# Patient Record
Sex: Female | Born: 1945 | Race: Black or African American | Hispanic: No | Marital: Married | Smoking: Former smoker
Health system: Southern US, Community
[De-identification: ages and names within clinical notes are randomized; demographics above are authoritative.]

## PROBLEM LIST (undated history)

## (undated) DIAGNOSIS — M316 Other giant cell arteritis: Secondary | ICD-10-CM

## (undated) DIAGNOSIS — E119 Type 2 diabetes mellitus without complications: Secondary | ICD-10-CM

## (undated) DIAGNOSIS — E049 Nontoxic goiter, unspecified: Secondary | ICD-10-CM

## (undated) DIAGNOSIS — J329 Chronic sinusitis, unspecified: Secondary | ICD-10-CM

## (undated) DIAGNOSIS — Z720 Tobacco use: Secondary | ICD-10-CM

## (undated) DIAGNOSIS — E039 Hypothyroidism, unspecified: Secondary | ICD-10-CM

## (undated) DIAGNOSIS — K209 Esophagitis, unspecified without bleeding: Secondary | ICD-10-CM

## (undated) DIAGNOSIS — T783XXA Angioneurotic edema, initial encounter: Secondary | ICD-10-CM

## (undated) DIAGNOSIS — Z87898 Personal history of other specified conditions: Secondary | ICD-10-CM

## (undated) DIAGNOSIS — R55 Syncope and collapse: Secondary | ICD-10-CM

## (undated) DIAGNOSIS — F1290 Cannabis use, unspecified, uncomplicated: Secondary | ICD-10-CM

## (undated) DIAGNOSIS — E7800 Pure hypercholesterolemia, unspecified: Secondary | ICD-10-CM

## (undated) DIAGNOSIS — I10 Essential (primary) hypertension: Secondary | ICD-10-CM

## (undated) DIAGNOSIS — T464X5A Adverse effect of angiotensin-converting-enzyme inhibitors, initial encounter: Secondary | ICD-10-CM

## (undated) DIAGNOSIS — R944 Abnormal results of kidney function studies: Secondary | ICD-10-CM

## (undated) DIAGNOSIS — Z860100 Personal history of colon polyps, unspecified: Secondary | ICD-10-CM

## (undated) DIAGNOSIS — D259 Leiomyoma of uterus, unspecified: Secondary | ICD-10-CM

## (undated) DIAGNOSIS — K219 Gastro-esophageal reflux disease without esophagitis: Secondary | ICD-10-CM

## (undated) DIAGNOSIS — D649 Anemia, unspecified: Secondary | ICD-10-CM

## (undated) DIAGNOSIS — M8700 Idiopathic aseptic necrosis of unspecified bone: Secondary | ICD-10-CM

## (undated) HISTORY — DX: Adverse effect of angiotensin-converting-enzyme inhibitors, initial encounter: T46.4X5A

## (undated) HISTORY — PX: BREAST BIOPSY: SHX20

## (undated) HISTORY — DX: Personal history of other specified conditions: Z87.898

## (undated) HISTORY — DX: Pure hypercholesterolemia, unspecified: E78.00

## (undated) HISTORY — DX: Chronic sinusitis, unspecified: J32.9

## (undated) HISTORY — DX: Nontoxic goiter, unspecified: E04.9

## (undated) HISTORY — DX: Essential (primary) hypertension: I10

## (undated) HISTORY — PX: DILATION AND CURETTAGE OF UTERUS: SHX78

---

## 1973-04-16 HISTORY — PX: TUBAL LIGATION: SHX77

## 1977-04-16 HISTORY — PX: OTHER SURGICAL HISTORY: SHX169

## 2004-04-03 ENCOUNTER — Ambulatory Visit: Payer: Self-pay | Admitting: Internal Medicine

## 2005-03-29 ENCOUNTER — Ambulatory Visit: Payer: Self-pay | Admitting: Internal Medicine

## 2005-04-19 ENCOUNTER — Ambulatory Visit: Payer: Self-pay | Admitting: Internal Medicine

## 2011-09-25 LAB — CBC
HCT: 38.2 % (ref 35.0–47.0)
HGB: 13.1 g/dL (ref 12.0–16.0)
MCH: 34.2 pg — ABNORMAL HIGH (ref 26.0–34.0)
MCHC: 34.3 g/dL (ref 32.0–36.0)
MCV: 100 fL (ref 80–100)
Platelet: 174 10*3/uL (ref 150–440)
RBC: 3.83 10*6/uL (ref 3.80–5.20)
RDW: 14.5 % (ref 11.5–14.5)
WBC: 7.3 10*3/uL (ref 3.6–11.0)

## 2011-09-25 LAB — URINALYSIS, COMPLETE
Bilirubin,UR: NEGATIVE
Glucose,UR: NEGATIVE mg/dL (ref 0–75)
Hyaline Cast: 8
Leukocyte Esterase: NEGATIVE
Nitrite: POSITIVE
Ph: 5 (ref 4.5–8.0)
Protein: 30
RBC,UR: <1 /HPF (ref 0–5)
Specific Gravity: 1.016 (ref 1.003–1.030)
Squamous Epithelial: 1
WBC UR: 5 /HPF (ref 0–5)

## 2011-09-25 LAB — TROPONIN I: Troponin-I: 0.03 ng/mL

## 2011-09-25 LAB — COMPREHENSIVE METABOLIC PANEL
Albumin: 3.8 g/dL (ref 3.4–5.0)
Alkaline Phosphatase: 96 U/L (ref 50–136)
Anion Gap: 8 (ref 7–16)
BUN: 13 mg/dL (ref 7–18)
Bilirubin,Total: 0.3 mg/dL (ref 0.2–1.0)
Calcium, Total: 8.6 mg/dL (ref 8.5–10.1)
Chloride: 102 mmol/L (ref 98–107)
Co2: 26 mmol/L (ref 21–32)
Creatinine: 1.28 mg/dL (ref 0.60–1.30)
EGFR (African American): 51 — ABNORMAL LOW
EGFR (Non-African Amer.): 44 — ABNORMAL LOW
Glucose: 118 mg/dL — ABNORMAL HIGH (ref 65–99)
Osmolality: 273 (ref 275–301)
Potassium: 3.6 mmol/L (ref 3.5–5.1)
SGOT(AST): 26 U/L (ref 15–37)
SGPT (ALT): 28 U/L
Sodium: 136 mmol/L (ref 136–145)
Total Protein: 7.2 g/dL (ref 6.4–8.2)

## 2011-09-25 LAB — CK TOTAL AND CKMB (NOT AT ARMC)
CK, Total: 178 U/L (ref 21–215)
CK-MB: 1 ng/mL (ref 0.5–3.6)

## 2011-09-25 LAB — TSH: Thyroid Stimulating Horm: 3.77 u[IU]/mL

## 2011-09-26 ENCOUNTER — Observation Stay: Payer: Self-pay | Admitting: Internal Medicine

## 2011-09-26 LAB — CK TOTAL AND CKMB (NOT AT ARMC)
CK, Total: 159 U/L (ref 21–215)
CK, Total: 157 U/L (ref 21–215)
CK-MB: 1.2 ng/mL (ref 0.5–3.6)
CK-MB: 1.1 ng/mL (ref 0.5–3.6)

## 2011-09-26 LAB — TROPONIN I
Troponin-I: 0.04 ng/mL
Troponin-I: 0.05 ng/mL

## 2011-09-26 LAB — T4, FREE: Free Thyroxine: 1.03 ng/dL (ref 0.76–1.46)

## 2011-09-27 LAB — CBC WITH DIFFERENTIAL/PLATELET
Basophil #: 0 10*3/uL (ref 0.0–0.1)
Basophil %: 0.3 %
Eosinophil #: 0.1 10*3/uL (ref 0.0–0.7)
Eosinophil %: 1.3 %
HCT: 40.8 % (ref 35.0–47.0)
HGB: 13.8 g/dL (ref 12.0–16.0)
Lymphocyte #: 2.9 10*3/uL (ref 1.0–3.6)
Lymphocyte %: 55.5 %
MCH: 33.5 pg (ref 26.0–34.0)
MCHC: 33.7 g/dL (ref 32.0–36.0)
MCV: 100 fL (ref 80–100)
Monocyte #: 0.3 x10 3/mm (ref 0.2–0.9)
Monocyte %: 5.9 %
Neutrophil #: 2 10*3/uL (ref 1.4–6.5)
Neutrophil %: 37 %
Platelet: 204 10*3/uL (ref 150–440)
RBC: 4.1 10*6/uL (ref 3.80–5.20)
RDW: 14.3 % (ref 11.5–14.5)
WBC: 5.3 10*3/uL (ref 3.6–11.0)

## 2011-09-27 LAB — BASIC METABOLIC PANEL
Anion Gap: 7 (ref 7–16)
BUN: 9 mg/dL (ref 7–18)
Calcium, Total: 8.8 mg/dL (ref 8.5–10.1)
Chloride: 110 mmol/L — ABNORMAL HIGH (ref 98–107)
Co2: 26 mmol/L (ref 21–32)
Creatinine: 0.98 mg/dL (ref 0.60–1.30)
EGFR (African American): >60
EGFR (Non-African Amer.): >60
Glucose: 95 mg/dL (ref 65–99)
Osmolality: 283 (ref 275–301)
Potassium: 3.9 mmol/L (ref 3.5–5.1)
Sodium: 143 mmol/L (ref 136–145)

## 2011-09-27 LAB — LIPID PANEL
Cholesterol: 180 mg/dL (ref 0–200)
HDL Cholesterol: 79 mg/dL — ABNORMAL HIGH (ref 40–60)
Ldl Cholesterol, Calc: 90 mg/dL (ref 0–100)
Triglycerides: 55 mg/dL (ref 0–200)
VLDL Cholesterol, Calc: 11 mg/dL (ref 5–40)

## 2011-09-27 LAB — MAGNESIUM: Magnesium: 2 mg/dL

## 2011-09-28 LAB — URINE CULTURE

## 2012-07-28 ENCOUNTER — Telehealth: Payer: Self-pay | Admitting: Internal Medicine

## 2012-07-28 MED ORDER — LEVOTHYROXINE SODIUM 75 MCG PO TABS: 75.0000 ug | ORAL_TABLET | Freq: Every day | ORAL | Status: DC

## 2012-07-28 NOTE — Telephone Encounter (Signed)
Synthroid sent in to [facility]. (#30 with one refill).  Pt will need to keep her establish appt with me in %/14.  Please notify pt rx sent in.

## 2012-07-28 NOTE — Telephone Encounter (Signed)
Patient notified that her prescription for Synthroid was sent to [facility] with (#30 and one refill).

## 2012-07-28 NOTE — Telephone Encounter (Signed)
Patient is currently out of her medication and she is scheduled for an appointment in May.  Synthroid .75 mcg

## 2012-07-28 NOTE — Telephone Encounter (Signed)
Patient uses [facility]  [address]  [place] # [phone]

## 2012-07-28 NOTE — Telephone Encounter (Signed)
Please Advise.... Patient has not been seen here but does have appointment in May.Marland KitchenMarland Kitchen

## 2012-08-24 DIAGNOSIS — R404 Transient alteration of awareness: Secondary | ICD-10-CM | POA: Diagnosis not present

## 2012-09-01 ENCOUNTER — Ambulatory Visit: Payer: Self-pay | Admitting: Internal Medicine

## 2012-09-03 ENCOUNTER — Encounter: Payer: Self-pay | Admitting: Internal Medicine

## 2012-09-03 ENCOUNTER — Ambulatory Visit (INDEPENDENT_AMBULATORY_CARE_PROVIDER_SITE_OTHER): Payer: Medicare Other | Admitting: Internal Medicine

## 2012-09-03 VITALS — BP 122/80 | HR 56 | Temp 98.7°F | Ht 64.0 in | Wt 134.5 lb

## 2012-09-03 DIAGNOSIS — I10 Essential (primary) hypertension: Secondary | ICD-10-CM

## 2012-09-03 DIAGNOSIS — E7800 Pure hypercholesterolemia, unspecified: Secondary | ICD-10-CM

## 2012-09-03 DIAGNOSIS — R55 Syncope and collapse: Secondary | ICD-10-CM | POA: Diagnosis not present

## 2012-09-03 DIAGNOSIS — D649 Anemia, unspecified: Secondary | ICD-10-CM

## 2012-09-03 DIAGNOSIS — D259 Leiomyoma of uterus, unspecified: Secondary | ICD-10-CM

## 2012-09-03 DIAGNOSIS — E039 Hypothyroidism, unspecified: Secondary | ICD-10-CM | POA: Diagnosis not present

## 2012-09-03 LAB — CBC WITH DIFFERENTIAL/PLATELET
Basophils Absolute: 0 10*3/uL (ref 0.0–0.1)
Basophils Relative: 0.7 % (ref 0.0–3.0)
Eosinophils Absolute: 0.1 10*3/uL (ref 0.0–0.7)
Eosinophils Relative: 1.9 % (ref 0.0–5.0)
HCT: 35.8 % — ABNORMAL LOW (ref 36.0–46.0)
Hemoglobin: 12.2 g/dL (ref 12.0–15.0)
Lymphocytes Relative: 51.5 % — ABNORMAL HIGH (ref 12.0–46.0)
Lymphs Abs: 2.8 10*3/uL (ref 0.7–4.0)
MCHC: 34.1 g/dL (ref 30.0–36.0)
MCV: 97.4 fl (ref 78.0–100.0)
Monocytes Absolute: 0.3 10*3/uL (ref 0.1–1.0)
Monocytes Relative: 5.3 % (ref 3.0–12.0)
Neutro Abs: 2.2 10*3/uL (ref 1.4–7.7)
Neutrophils Relative %: 40.6 % — ABNORMAL LOW (ref 43.0–77.0)
Platelets: 193 10*3/uL (ref 150.0–400.0)
RBC: 3.67 Mil/uL — ABNORMAL LOW (ref 3.87–5.11)
RDW: 14.5 % (ref 11.5–14.6)
WBC: 5.4 10*3/uL (ref 4.5–10.5)

## 2012-09-03 LAB — COMPREHENSIVE METABOLIC PANEL
ALT: 17 U/L (ref 0–35)
AST: 22 U/L (ref 0–37)
Albumin: 3.6 g/dL (ref 3.5–5.2)
Alkaline Phosphatase: 53 U/L (ref 39–117)
BUN: 15 mg/dL (ref 6–23)
CO2: 25 mEq/L (ref 19–32)
Calcium: 8.9 mg/dL (ref 8.4–10.5)
Chloride: 109 mEq/L (ref 96–112)
Creatinine, Ser: 1 mg/dL (ref 0.4–1.2)
GFR: 61.96 mL/min (ref >60.00)
Glucose, Bld: 88 mg/dL (ref 70–99)
Potassium: 4.3 mEq/L (ref 3.5–5.1)
Sodium: 142 mEq/L (ref 135–145)
Total Bilirubin: 0.4 mg/dL (ref 0.3–1.2)
Total Protein: 6.3 g/dL (ref 6.0–8.3)

## 2012-09-03 LAB — TSH: TSH: 0.23 u[IU]/mL — ABNORMAL LOW (ref 0.35–5.50)

## 2012-09-03 LAB — LIPID PANEL
Cholesterol: 190 mg/dL (ref 0–200)
HDL: 66.5 mg/dL (ref >39.00)
LDL Cholesterol: 114 mg/dL — ABNORMAL HIGH (ref 0–99)
Total CHOL/HDL Ratio: 3
Triglycerides: 48 mg/dL (ref 0.0–149.0)
VLDL: 9.6 mg/dL (ref 0.0–40.0)

## 2012-09-03 NOTE — Progress Notes (Signed)
Subjective:    [name], female    DOB: 01/29/1946, 67 y.o.   MRN: [id number]  HPI 67 year old female with past history of hypercholesterolemia, hypertension and agoiter s/p thyroidectomy with resulting hypothyroidism.  She comes in today as a work in to discuss a syncopal episode that occurred approximately 10 days ago.  States she was at [employer].  Stood up to sing.  Felt hot and "woozy".  Sat down and begin to fan herself.  Had no pain.  No headache, light headedness or dizziness. Continued to feel hot, tried to stand again - and went down to the ground.  Someone in the church helped her down.  No fall.  Was completely out for a brief period.  Came to quickly.  Was not confused. No seizure activity.  EMS called.  States everything checked out fine.  She declined ER evaluation.  Symptoms improved.  Drank some cool water.  Went back to the service.  Has not had any other episodes since.  States she feels back to her baseline.  Stays active.  Mowed her yard approximately 6 days ago and had no problems doing this.  No chest pain or tightness with increased activity or exertion.  Has cut down on her alcohol intake.  Also, has decreased her marijuana use.    She did have another syncopal episode 6/13.  Evaluated in the hospital then.  No etiology found.  Saw Dr Gwen Pounds in f/u.  Refer to his notes and my previous notes for details.  Has not had another episode until this episode 10 days ago.  Has stopped her blood pressure medication.  Has been off for a while.    Past Medical History  Diagnosis Date  . H/O syncope   . Hypertension   . Goiter     s/p thyroidectomy  . Hypercholesterolemia   . Recurrent sinus infections     and allergy problems    Outpatient Encounter Prescriptions as of 09/03/2012  Medication Sig Dispense Refill  . aspirin 81 MG tablet Take 81 mg by mouth daily.      Marland Kitchen levothyroxine (SYNTHROID) 75 MCG tablet Take 1 tablet (75 mcg total) by mouth daily before  breakfast.  30 tablet  1   No facility-administered encounter medications on file as of 09/03/2012.    Review of Systems Patient denies any headache, lightheadedness or dizziness. No sinus or allergy symptoms.   No chest pain, tightness or palpitations.  No increased shortness of breath, cough or congestion.  No nausea or vomiting.  No acid reflux.  No abdominal pain or cramping.  No bowel change, such as diarrhea, constipation, BRBPR or melana.  No urine change.  Syncopal episode as outlined.  No reoccurrence.  Feels back to baseline now.       Objective:   Physical Exam Filed Vitals:   09/03/12 0828  BP: 122/80  Pulse: 56  Temp: 98.7 F (37.1 C)   Blood pressure recheck:  28/88  67 year old female in no acute distress.   HEENT:  Nares- clear.  Oropharynx - without lesions. NECK:  Supple.  Nontender.  No audible bruit.  HEART:  Appears to be regular. LUNGS:  No crackles or wheezing audible.  Respirations even and unlabored.  RADIAL PULSE:  Equal bilaterally.   ABDOMEN:  Soft, nontender.  Bowel sounds present and normal.  No audible abdominal bruit.  EXTREMITIES:  No increased edema present.  DP pulses palpable and equal bilaterally.  NEURO:  No focal neurological deficits noted on exam.      Assessment & Plan:  HEALTH MAINTENANCE.  Overdue a physical.  Once above issues sorted through, will arrange a physical. Needs mammogram.

## 2012-09-04 DIAGNOSIS — E039 Hypothyroidism, unspecified: Secondary | ICD-10-CM | POA: Insufficient documentation

## 2012-09-04 DIAGNOSIS — D649 Anemia, unspecified: Secondary | ICD-10-CM | POA: Insufficient documentation

## 2012-09-04 DIAGNOSIS — I6529 Occlusion and stenosis of unspecified carotid artery: Secondary | ICD-10-CM | POA: Diagnosis not present

## 2012-09-04 DIAGNOSIS — R55 Syncope and collapse: Secondary | ICD-10-CM | POA: Insufficient documentation

## 2012-09-04 DIAGNOSIS — I10 Essential (primary) hypertension: Secondary | ICD-10-CM | POA: Insufficient documentation

## 2012-09-04 DIAGNOSIS — D259 Leiomyoma of uterus, unspecified: Secondary | ICD-10-CM | POA: Insufficient documentation

## 2012-09-04 DIAGNOSIS — E785 Hyperlipidemia, unspecified: Secondary | ICD-10-CM | POA: Diagnosis not present

## 2012-09-04 NOTE — Assessment & Plan Note (Signed)
Syncope as outlined.  Symptoms have completely resolved.  Second episode.  EKG ontained and revealed SR with TWI in III. Unclear etiology.  Question if vasovagal response.  Given reoccurrence, will refer back to Dr Gwen Pounds for further evaluation and to confirm no cardiac etiology.  Pt comfortable with this plan.  Will obtain cbc, metc and tsh.

## 2012-09-04 NOTE — Assessment & Plan Note (Signed)
On no medication currently.  Has been off for a while.  Blood pressure a little elevated on my check.  Have her spot check her pressure and get her back in soon to reassess.  Remain of medication for now. Check metabolic panel.

## 2012-09-04 NOTE — Assessment & Plan Note (Signed)
On synthroid.  Check tsh.  

## 2012-09-04 NOTE — Assessment & Plan Note (Signed)
Previous anemia.  Check cbc.

## 2012-09-04 NOTE — Assessment & Plan Note (Signed)
Pelvic ultrasound revealed uterine fibroids.  Saw Dr DeFrancesco.  He recommended continuing yearly pelvic exams.    

## 2012-09-05 ENCOUNTER — Other Ambulatory Visit: Payer: Self-pay | Admitting: Internal Medicine

## 2012-09-05 ENCOUNTER — Other Ambulatory Visit: Payer: Self-pay | Admitting: *Deleted

## 2012-09-05 DIAGNOSIS — E039 Hypothyroidism, unspecified: Secondary | ICD-10-CM

## 2012-09-05 MED ORDER — LEVOTHYROXINE SODIUM 50 MCG PO TABS: 50.0000 ug | ORAL_TABLET | Freq: Every day | ORAL | Status: DC

## 2012-09-05 NOTE — Progress Notes (Signed)
F/u tsh ordered

## 2012-09-11 DIAGNOSIS — I209 Angina pectoris, unspecified: Secondary | ICD-10-CM | POA: Diagnosis not present

## 2012-09-26 ENCOUNTER — Encounter: Payer: Self-pay | Admitting: Internal Medicine

## 2012-10-07 ENCOUNTER — Ambulatory Visit (INDEPENDENT_AMBULATORY_CARE_PROVIDER_SITE_OTHER): Payer: Medicare Other | Admitting: Internal Medicine

## 2012-10-07 ENCOUNTER — Encounter: Payer: Self-pay | Admitting: Internal Medicine

## 2012-10-07 VITALS — BP 100/70 | HR 74 | Temp 98.9°F | Ht 64.0 in | Wt 135.0 lb

## 2012-10-07 DIAGNOSIS — R55 Syncope and collapse: Secondary | ICD-10-CM

## 2012-10-07 DIAGNOSIS — D649 Anemia, unspecified: Secondary | ICD-10-CM | POA: Diagnosis not present

## 2012-10-07 DIAGNOSIS — D259 Leiomyoma of uterus, unspecified: Secondary | ICD-10-CM

## 2012-10-07 DIAGNOSIS — E039 Hypothyroidism, unspecified: Secondary | ICD-10-CM | POA: Diagnosis not present

## 2012-10-07 DIAGNOSIS — I10 Essential (primary) hypertension: Secondary | ICD-10-CM | POA: Diagnosis not present

## 2012-10-07 LAB — TSH: TSH: 1.56 u[IU]/mL (ref 0.35–5.50)

## 2012-10-08 ENCOUNTER — Encounter: Payer: Self-pay | Admitting: *Deleted

## 2012-10-13 ENCOUNTER — Encounter: Payer: Self-pay | Admitting: Internal Medicine

## 2012-10-13 NOTE — Assessment & Plan Note (Signed)
Previous anemia.  09/03/12 - hgb 12.2.  

## 2012-10-13 NOTE — Progress Notes (Signed)
  Subjective:    [name], female    DOB: 1945/10/15, 67 y.o.   MRN: [id number]  HPI 67 year old female with past history of hypercholesterolemia, hypertension and a goiter s/p thyroidectomy with resulting hypothyroidism.  She comes in today for a scheduled follow up.  Had a previous syncopal episode.  See last note for details.  Saw cardiology.  Had cardiology w/up including stress testing and holter monitor.  States everything has checked out fine.  She has felt good.  No further syncope or near syncope.  No chest pain or tightness with increased activity or exertion.  Has cut down on her alcohol intake.  Also, has decreased her marijuana use.  Overall she feels good.  Off blood pressure medication.      Past Medical History  Diagnosis Date  . H/O syncope   . Hypertension   . Goiter     s/p thyroidectomy  . Hypercholesterolemia   . Recurrent sinus infections     and allergy problems    Outpatient Encounter Prescriptions as of 10/07/2012  Medication Sig Dispense Refill  . levothyroxine (SYNTHROID, LEVOTHROID) 50 MCG tablet Take 1 tablet (50 mcg total) by mouth daily.  90 tablet  3  . aspirin 81 MG tablet Take 81 mg by mouth daily.       No facility-administered encounter medications on file as of 10/07/2012.    Review of Systems Patient denies any headache, lightheadedness or dizziness. No sinus or allergy symptoms.   No chest pain, tightness or palpitations.  No increased shortness of breath, cough or congestion.  No nausea or vomiting.  No acid reflux.  No abdominal pain or cramping.  No bowel change, such as diarrhea, constipation, BRBPR or melana.  No urine change.  No syncope or near syncopal episodes. Overall she feels good.       Objective:   Physical Exam  Filed Vitals:   10/07/12 1050  BP: 100/70  Pulse: 74  Temp: 98.9 F (37.2 C)   Blood pressure recheck:  114/64, pulse 79  67 year old female in no acute distress.   HEENT:  Nares- clear.  Oropharynx  - without lesions. NECK:  Supple.  Nontender.  No audible bruit.  HEART:  Appears to be regular. LUNGS:  No crackles or wheezing audible.  Respirations even and unlabored.  RADIAL PULSE:  Equal bilaterally.   ABDOMEN:  Soft, nontender.  Bowel sounds present and normal.  No audible abdominal bruit.  EXTREMITIES:  No increased edema present.  DP pulses palpable and equal bilaterally.      NEURO:  No focal neurological deficits noted on exam.      Assessment & Plan:  HEALTH MAINTENANCE.  Overdue a physical.  Schedule.  Needs mammogram.

## 2012-10-13 NOTE — Assessment & Plan Note (Signed)
Recent syncopal episode.  W/up as outlined.  Saw Dr Kowalski.  Had cardiac w/up as outlined.  W/up negative.  Has had no reoccurring episodes.  Feels good.  Staying active.  Will follow.  Will hold on further w/up at this time.  Pt comfortable with this plan.     

## 2012-10-13 NOTE — Assessment & Plan Note (Signed)
Synthroid just recently decreased.  Recheck tsh today.  

## 2012-10-13 NOTE — Assessment & Plan Note (Signed)
On no medication currently.  Blood pressure doing well.  Follow.  

## 2012-10-13 NOTE — Assessment & Plan Note (Signed)
Pelvic ultrasound revealed uterine fibroids.  Saw Dr DeFrancesco.  He recommended continuing yearly pelvic exams.    

## 2012-11-06 ENCOUNTER — Ambulatory Visit: Payer: Self-pay | Admitting: Internal Medicine

## 2013-01-07 ENCOUNTER — Telehealth: Payer: Self-pay | Admitting: Emergency Medicine

## 2013-01-07 ENCOUNTER — Other Ambulatory Visit (HOSPITAL_COMMUNITY)
Admission: RE | Admit: 2013-01-07 | Discharge: 2013-01-07 | Disposition: A | Discharge status: Home / Self Care | Payer: Medicare Other | Source: Ambulatory Visit | Attending: Internal Medicine | Admitting: Internal Medicine

## 2013-01-07 ENCOUNTER — Encounter: Payer: Self-pay | Admitting: Internal Medicine

## 2013-01-07 ENCOUNTER — Ambulatory Visit (INDEPENDENT_AMBULATORY_CARE_PROVIDER_SITE_OTHER): Payer: Medicare Other | Admitting: Internal Medicine

## 2013-01-07 VITALS — BP 120/80 | HR 60 | Temp 98.5°F | Ht 64.0 in | Wt 134.8 lb

## 2013-01-07 DIAGNOSIS — Z1211 Encounter for screening for malignant neoplasm of colon: Secondary | ICD-10-CM | POA: Diagnosis not present

## 2013-01-07 DIAGNOSIS — Z0000 Encounter for general adult medical examination without abnormal findings: Secondary | ICD-10-CM

## 2013-01-07 DIAGNOSIS — D259 Leiomyoma of uterus, unspecified: Secondary | ICD-10-CM | POA: Diagnosis not present

## 2013-01-07 DIAGNOSIS — Z124 Encounter for screening for malignant neoplasm of cervix: Secondary | ICD-10-CM

## 2013-01-07 DIAGNOSIS — R55 Syncope and collapse: Secondary | ICD-10-CM

## 2013-01-07 DIAGNOSIS — E039 Hypothyroidism, unspecified: Secondary | ICD-10-CM | POA: Diagnosis not present

## 2013-01-07 DIAGNOSIS — Z01419 Encounter for gynecological examination (general) (routine) without abnormal findings: Secondary | ICD-10-CM

## 2013-01-07 DIAGNOSIS — Z1239 Encounter for other screening for malignant neoplasm of breast: Secondary | ICD-10-CM

## 2013-01-07 DIAGNOSIS — Z1151 Encounter for screening for human papillomavirus (HPV): Secondary | ICD-10-CM | POA: Insufficient documentation

## 2013-01-07 DIAGNOSIS — D649 Anemia, unspecified: Secondary | ICD-10-CM

## 2013-01-07 DIAGNOSIS — I10 Essential (primary) hypertension: Secondary | ICD-10-CM

## 2013-01-07 LAB — TSH: TSH: 2.73 u[IU]/mL (ref 0.35–5.50)

## 2013-01-07 NOTE — Telephone Encounter (Signed)
LVM for patient to call about mammo apt at [facility] 01/15/13 @ 1pm.

## 2013-01-07 NOTE — Progress Notes (Signed)
Subjective:    [name], female    DOB: 1945/11/19, 67 y.o.   MRN: [id number]  HPI 67 year old female with past history of hypercholesterolemia, hypertension and a goiter s/p thyroidectomy with resulting hypothyroidism.  She comes in today for her annual Medicare wellness examination and management of other chronic and acute problems.   The risk factors are reflected in the social history.  The roster of all physicians providing medical care to patient - is listed in the Snapshot section of the chart.  Activities of daily living:  The patient is 100% independent in all ADLs: dressing, toileting, feeding as well as independent mobility  Home safety : The patient has smoke detectors in the home. They wear seatbelts.  There are no firearms at home. There is no violence in the home.   There is no risks for hepatitis, STDs or HIV. There is no history of blood transfusion. They have no travel history to infectious disease endemic areas of the world.  The patient has no hearing changes.  Discussed the need for eye exams.   She does not  have excessive sun exposure.   Diet: the importance of a healthy diet is discussed.   The benefits of regular aerobic exercise were discussed. She walks, runs and push mows her yard.     Depression screen: there are no signs or vegative symptoms of depression- irritability, change in appetite, anhedonia, sadness/tearfullness.  Cognitive assessment: the patient manages all their financial and personal affairs and is actively engaged. They could relate day,date,year and events; recalled 2/3 objects at 5 minutes.  Able to  perform simple calculations.    The following portions of the patient's history were reviewed and updated as appropriate: allergies, current medications, past family history, past medical history,  past surgical history, past social history  and problem list.  Body mass index was assessed and reviewed.   During the course of the  visit the patient was educated and counseled about appropriate screening and preventive services including : fall prevention , diabetes screening, nutrition counseling, colorectal cancer screening, and recommended immunizations.    On review, she had a previous syncopal episode.  See previous notes for details.  Saw cardiology.  Had cardiology w/up including stress testing and holter monitor.  States everything has checked out fine.  She has felt good.  No further syncope or near syncope.  No chest pain or tightness with increased activity or exertion.  Has cut down on her alcohol intake.  Also, has decreased her marijuana use.  Overall she feels good.  Off blood pressure medication.  Blood pressure has been doing well.       Past Medical History  Diagnosis Date  . H/O syncope   . Hypertension   . Goiter     s/p thyroidectomy  . Hypercholesterolemia   . Recurrent sinus infections     and allergy problems    Outpatient Encounter Prescriptions as of 01/07/2013  Medication Sig Dispense Refill  . levothyroxine (SYNTHROID, LEVOTHROID) 50 MCG tablet Take 1 tablet (50 mcg total) by mouth daily.  90 tablet  3  . [DISCONTINUED] aspirin 81 MG tablet Take 81 mg by mouth daily.       No facility-administered encounter medications on file as of 01/07/2013.    Review of Systems Patient denies any headache, lightheadedness or dizziness.  No sinus or allergy symptoms.   No chest pain, tightness or palpitations.  No increased shortness of breath, cough or congestion.  No nausea or vomiting.  No acid reflux.  No abdominal pain or cramping.  No bowel change, such as diarrhea, constipation, BRBPR or melana.  No urine change.  No syncope or near syncopal episodes. Overall she feels good.  Blood pressure has been doing well.       Objective:   Physical Exam  Filed Vitals:   01/07/13 0921  BP: 120/80  Pulse: 60  Temp: 98.5 F (36.9 C)   Blood pressure recheck:  86/12  67 year old female in no acute  distress.   HEENT:  Nares- clear.  Oropharynx - without lesions. NECK:  Supple.  Nontender.  No audible bruit.  HEART:  Appears to be regular. LUNGS:  No crackles or wheezing audible.  Respirations even and unlabored.  RADIAL PULSE:  Equal bilaterally.    BREASTS:  No nipple discharge or nipple retraction present.  Could not appreciate any distinct nodules or axillary adenopathy.  ABDOMEN:  Soft, nontender.  Bowel sounds present and normal.  No audible abdominal bruit.  GU:  Normal external genitalia.  Vaginal vault without lesions.  Cervix identified.  Pap performed. Could not appreciate any adnexal masses or tenderness.   RECTAL:  Heme negative.   EXTREMITIES:  No increased edema present.  DP pulses palpable and equal bilaterally.           Assessment & Plan:  HEALTH MAINTENANCE.  Physical today.  Schedule mammogram.  She declines colonoscopy.  Discussed with her regarding the need for early detection.  She continues to decline.  Declines flu vaccine, pneumovax and shingles vaccine.

## 2013-01-08 ENCOUNTER — Encounter: Payer: Self-pay | Admitting: *Deleted

## 2013-01-09 ENCOUNTER — Encounter: Payer: Self-pay | Admitting: *Deleted

## 2013-01-11 ENCOUNTER — Encounter: Payer: Self-pay | Admitting: Internal Medicine

## 2013-01-11 NOTE — Assessment & Plan Note (Signed)
Synthroid just recently decreased.  Recheck tsh today.

## 2013-01-11 NOTE — Assessment & Plan Note (Signed)
Recent syncopal episode.  W/up as outlined.  Saw Dr Kowalski.  Had cardiac w/up as outlined.  W/up negative.  Has had no reoccurring episodes.  Feels good.  Staying active.  Will follow.  Will hold on further w/up at this time.  Pt comfortable with this plan.     

## 2013-01-11 NOTE — Assessment & Plan Note (Signed)
Previous anemia.  09/03/12 - hgb 12.2.

## 2013-01-11 NOTE — Assessment & Plan Note (Signed)
On no medication currently.  Blood pressure doing well.  Follow.

## 2013-01-11 NOTE — Assessment & Plan Note (Signed)
Pelvic ultrasound revealed uterine fibroids.  Saw Dr DeFrancesco.  He recommended continuing yearly pelvic exams.    

## 2013-01-13 NOTE — Telephone Encounter (Signed)
Pt aware.

## 2013-01-15 DIAGNOSIS — Z1231 Encounter for screening mammogram for malignant neoplasm of breast: Secondary | ICD-10-CM | POA: Diagnosis not present

## 2013-02-06 ENCOUNTER — Other Ambulatory Visit (INDEPENDENT_AMBULATORY_CARE_PROVIDER_SITE_OTHER): Payer: Medicare Other

## 2013-02-06 DIAGNOSIS — Z1211 Encounter for screening for malignant neoplasm of colon: Secondary | ICD-10-CM | POA: Diagnosis not present

## 2013-02-09 LAB — FECAL OCCULT BLOOD, IMMUNOCHEMICAL: Fecal Occult Bld: POSITIVE

## 2013-02-18 ENCOUNTER — Telehealth: Payer: Self-pay | Admitting: *Deleted

## 2013-02-18 ENCOUNTER — Other Ambulatory Visit (INDEPENDENT_AMBULATORY_CARE_PROVIDER_SITE_OTHER): Payer: Medicare Other

## 2013-02-18 DIAGNOSIS — R195 Other fecal abnormalities: Secondary | ICD-10-CM | POA: Diagnosis not present

## 2013-02-18 NOTE — Telephone Encounter (Signed)
What labs and dx?  

## 2013-02-18 NOTE — Telephone Encounter (Signed)
Placed order for cbc.

## 2013-02-19 ENCOUNTER — Encounter: Payer: Self-pay | Admitting: *Deleted

## 2013-02-19 LAB — CBC WITH DIFFERENTIAL/PLATELET
Basophils Absolute: 0 10*3/uL (ref 0.0–0.1)
Basophils Relative: 0.2 % (ref 0.0–3.0)
Eosinophils Absolute: 0.1 10*3/uL (ref 0.0–0.7)
Eosinophils Relative: 2.3 % (ref 0.0–5.0)
HCT: 37.8 % (ref 36.0–46.0)
Hemoglobin: 12.8 g/dL (ref 12.0–15.0)
Lymphocytes Relative: 50.5 % — ABNORMAL HIGH (ref 12.0–46.0)
Lymphs Abs: 2.3 10*3/uL (ref 0.7–4.0)
MCHC: 33.8 g/dL (ref 30.0–36.0)
MCV: 97.3 fl (ref 78.0–100.0)
Monocytes Absolute: 0.2 10*3/uL (ref 0.1–1.0)
Monocytes Relative: 3.7 % (ref 3.0–12.0)
Neutro Abs: 2 10*3/uL (ref 1.4–7.7)
Neutrophils Relative %: 43.3 % (ref 43.0–77.0)
Platelets: 209 10*3/uL (ref 150.0–400.0)
RBC: 3.88 Mil/uL (ref 3.87–5.11)
RDW: 15 % — ABNORMAL HIGH (ref 11.5–14.6)
WBC: 4.6 10*3/uL (ref 4.5–10.5)

## 2013-07-09 ENCOUNTER — Ambulatory Visit (INDEPENDENT_AMBULATORY_CARE_PROVIDER_SITE_OTHER): Payer: Medicare Other | Admitting: Internal Medicine

## 2013-07-09 ENCOUNTER — Encounter: Payer: Self-pay | Admitting: Internal Medicine

## 2013-07-09 VITALS — BP 150/80 | HR 61 | Temp 99.0°F | Ht 64.0 in | Wt 136.0 lb

## 2013-07-09 DIAGNOSIS — D649 Anemia, unspecified: Secondary | ICD-10-CM

## 2013-07-09 DIAGNOSIS — R55 Syncope and collapse: Secondary | ICD-10-CM | POA: Diagnosis not present

## 2013-07-09 DIAGNOSIS — E039 Hypothyroidism, unspecified: Secondary | ICD-10-CM

## 2013-07-09 DIAGNOSIS — I10 Essential (primary) hypertension: Secondary | ICD-10-CM | POA: Diagnosis not present

## 2013-07-09 DIAGNOSIS — Z13220 Encounter for screening for lipoid disorders: Secondary | ICD-10-CM

## 2013-07-09 DIAGNOSIS — D259 Leiomyoma of uterus, unspecified: Secondary | ICD-10-CM

## 2013-07-09 LAB — COMPREHENSIVE METABOLIC PANEL
ALT: 17 U/L (ref 0–35)
AST: 23 U/L (ref 0–37)
Albumin: 3.9 g/dL (ref 3.5–5.2)
Alkaline Phosphatase: 67 U/L (ref 39–117)
BUN: 10 mg/dL (ref 6–23)
CO2: 29 mEq/L (ref 19–32)
Calcium: 9 mg/dL (ref 8.4–10.5)
Chloride: 107 mEq/L (ref 96–112)
Creatinine, Ser: 1.1 mg/dL (ref 0.4–1.2)
GFR: 67.43 mL/min (ref >60.00)
Glucose, Bld: 81 mg/dL (ref 70–99)
Potassium: 4.6 mEq/L (ref 3.5–5.1)
Sodium: 141 mEq/L (ref 135–145)
Total Bilirubin: 0.5 mg/dL (ref 0.3–1.2)
Total Protein: 6.5 g/dL (ref 6.0–8.3)

## 2013-07-09 LAB — CBC WITH DIFFERENTIAL/PLATELET
Basophils Absolute: 0 10*3/uL (ref 0.0–0.1)
Basophils Relative: 0.4 % (ref 0.0–3.0)
Eosinophils Absolute: 0.1 10*3/uL (ref 0.0–0.7)
Eosinophils Relative: 2 % (ref 0.0–5.0)
HCT: 36.8 % (ref 36.0–46.0)
Hemoglobin: 12.1 g/dL (ref 12.0–15.0)
Lymphocytes Relative: 49.5 % — ABNORMAL HIGH (ref 12.0–46.0)
Lymphs Abs: 2.4 10*3/uL (ref 0.7–4.0)
MCHC: 33 g/dL (ref 30.0–36.0)
MCV: 99 fl (ref 78.0–100.0)
Monocytes Absolute: 0.3 10*3/uL (ref 0.1–1.0)
Monocytes Relative: 5.3 % (ref 3.0–12.0)
Neutro Abs: 2.1 10*3/uL (ref 1.4–7.7)
Neutrophils Relative %: 42.8 % — ABNORMAL LOW (ref 43.0–77.0)
Platelets: 214 10*3/uL (ref 150.0–400.0)
RBC: 3.71 Mil/uL — ABNORMAL LOW (ref 3.87–5.11)
RDW: 14.9 % — ABNORMAL HIGH (ref 11.5–14.6)
WBC: 4.8 10*3/uL (ref 4.5–10.5)

## 2013-07-09 LAB — TSH: TSH: 1.83 u[IU]/mL (ref 0.35–5.50)

## 2013-07-09 NOTE — Assessment & Plan Note (Addendum)
On no medication currently.  Blood pressure as outlined.  Follow.  Check metabolic panel today.

## 2013-07-09 NOTE — Progress Notes (Signed)
Pre-visit discussion using our clinic review tool. No additional management support is needed unless otherwise documented below in the visit note.  

## 2013-07-09 NOTE — Progress Notes (Signed)
  Subjective:    [name], female    DOB: 12-Dec-1945, 68 y.o.   MRN: [id number]  HPI 68 year old female with past history of hypercholesterolemia, hypertension and a goiter s/p thyroidectomy with resulting hypothyroidism.  She comes in today for a scheduled follow up.  Had a previous syncopal episode.  See previous note for details.  Saw cardiology.  Had cardiology w/up including stress testing and holter monitor.  States everything has checked out fine.  She has felt good.  No further syncope or near syncope.  No chest pain or tightness with increased activity or exertion.  Has cut down on her alcohol intake.  Also, has decreased her marijuana use.  Overall she feels good.  Off blood pressure medication.  States her blood pressures have been averaging systolic readings 622-633.      Past Medical History  Diagnosis Date  . H/O syncope   . Hypertension   . Goiter     s/p thyroidectomy  . Hypercholesterolemia   . Recurrent sinus infections     and allergy problems    Outpatient Encounter Prescriptions as of 07/09/2013  Medication Sig  . levothyroxine (SYNTHROID, LEVOTHROID) 50 MCG tablet Take 1 tablet (50 mcg total) by mouth daily.    Review of Systems Patient denies any headache, lightheadedness or dizziness. No sinus or allergy symptoms.   No chest pain, tightness or palpitations.  No increased shortness of breath, cough or congestion.  No nausea or vomiting.  No acid reflux.  No abdominal pain or cramping.  No bowel change, such as diarrhea, constipation, BRBPR or melana.  No urine change.  No syncope or near syncopal episodes. Overall she feels good.       Objective:   Physical Exam  Filed Vitals:   07/09/13 0905  BP: 150/80  Pulse: 61  Temp: 99 F (37.2 C)   Blood pressure recheck:  138-18/30-71  68 year old female in no acute distress.   HEENT:  Nares- clear.  Oropharynx - without lesions. NECK:  Supple.  Nontender.  No audible bruit.  HEART:  Appears to  be regular. LUNGS:  No crackles or wheezing audible.  Respirations even and unlabored.  RADIAL PULSE:  Equal bilaterally.   ABDOMEN:  Soft, nontender.  Bowel sounds present and normal.  No audible abdominal bruit.  EXTREMITIES:  No increased edema present.  DP pulses palpable and equal bilaterally.      Assessment & Plan:  HEALTH MAINTENANCE.  Overdue a physical.  Schedule.  Needs mammogram.  Has not followed through with getting her mammogram.  Discussed the need for colonoscopy.  She declines.  States she will think about this and let me know if she decides to proceed.   I spent 25 minutes with the patient and more than 50% of the time was spent in consultation regarding the above.

## 2013-07-10 ENCOUNTER — Encounter: Payer: Self-pay | Admitting: *Deleted

## 2013-07-12 ENCOUNTER — Encounter: Payer: Self-pay | Admitting: Internal Medicine

## 2013-07-12 NOTE — Assessment & Plan Note (Addendum)
On synthroid.  Recheck tsh today.   

## 2013-07-12 NOTE — Assessment & Plan Note (Signed)
Pelvic ultrasound revealed uterine fibroids.  Saw Dr Enzo Bi.  He recommended continuing yearly pelvic exams.

## 2013-07-12 NOTE — Assessment & Plan Note (Addendum)
Previous anemia.  09/03/12 - hgb 12.2.  Recheck today.

## 2013-07-12 NOTE — Assessment & Plan Note (Signed)
Recent syncopal episode.  W/up as outlined.  Saw Dr Nehemiah Massed.  Had cardiac w/up as outlined.  W/up negative.  Has had no reoccurring episodes.  Feels good.  Staying active.  Will follow.  Will hold on further w/up at this time.  Pt comfortable with this plan.

## 2013-09-10 ENCOUNTER — Telehealth: Payer: Self-pay | Admitting: Internal Medicine

## 2013-09-10 MED ORDER — LEVOTHYROXINE SODIUM 50 MCG PO TABS: 50.0000 ug | ORAL_TABLET | Freq: Every day | ORAL | Status: DC

## 2013-09-10 NOTE — Telephone Encounter (Signed)
No voicemails have bee received. Rx sent to pharmacy by escript

## 2013-09-10 NOTE — Telephone Encounter (Signed)
Pt states she has left msgs regarding needing a refill on thyroid medication.  Pharmacy [facility].

## 2013-10-13 ENCOUNTER — Telehealth: Payer: Self-pay | Admitting: Internal Medicine

## 2013-10-13 NOTE — Telephone Encounter (Signed)
May or June 

## 2013-10-13 NOTE — Telephone Encounter (Signed)
The patient check her BP on May 29 th and it was 130/84.

## 2013-10-14 ENCOUNTER — Ambulatory Visit: Payer: Medicare Other | Admitting: Internal Medicine

## 2013-12-23 ENCOUNTER — Telehealth: Payer: Self-pay | Admitting: Internal Medicine

## 2013-12-23 ENCOUNTER — Encounter: Payer: Self-pay | Admitting: Internal Medicine

## 2013-12-23 NOTE — Telephone Encounter (Signed)
Pt overdue a physical.  Please schedule.  Thanks.

## 2013-12-25 ENCOUNTER — Telehealth: Payer: Self-pay | Admitting: Internal Medicine

## 2013-12-25 NOTE — Telephone Encounter (Signed)
LMTCB to schedule a physical appt.msn

## 2014-05-14 ENCOUNTER — Other Ambulatory Visit: Payer: Self-pay | Admitting: *Deleted

## 2014-05-14 ENCOUNTER — Telehealth: Payer: Self-pay | Admitting: Internal Medicine

## 2014-05-14 MED ORDER — LEVOTHYROXINE SODIUM 50 MCG PO TABS: 50.0000 ug | ORAL_TABLET | Freq: Every day | ORAL | Status: DC

## 2014-05-14 NOTE — Telephone Encounter (Signed)
Pt needs refill on Synthroid 50 mg. Please advise pt/msn

## 2014-05-14 NOTE — Telephone Encounter (Signed)
Refill was sent to pharmacy.

## 2014-05-19 ENCOUNTER — Encounter: Payer: Self-pay | Admitting: Internal Medicine

## 2014-05-19 ENCOUNTER — Ambulatory Visit (INDEPENDENT_AMBULATORY_CARE_PROVIDER_SITE_OTHER): Payer: Medicare Other | Admitting: Internal Medicine

## 2014-05-19 VITALS — BP 150/80 | HR 59 | Temp 98.4°F | Ht 64.2 in | Wt 130.5 lb

## 2014-05-19 DIAGNOSIS — E7800 Pure hypercholesterolemia, unspecified: Secondary | ICD-10-CM

## 2014-05-19 DIAGNOSIS — Z720 Tobacco use: Secondary | ICD-10-CM | POA: Diagnosis not present

## 2014-05-19 DIAGNOSIS — Z0000 Encounter for general adult medical examination without abnormal findings: Secondary | ICD-10-CM | POA: Diagnosis not present

## 2014-05-19 DIAGNOSIS — Z1211 Encounter for screening for malignant neoplasm of colon: Secondary | ICD-10-CM | POA: Diagnosis not present

## 2014-05-19 DIAGNOSIS — D649 Anemia, unspecified: Secondary | ICD-10-CM

## 2014-05-19 DIAGNOSIS — E039 Hypothyroidism, unspecified: Secondary | ICD-10-CM | POA: Diagnosis not present

## 2014-05-19 DIAGNOSIS — Z1239 Encounter for other screening for malignant neoplasm of breast: Secondary | ICD-10-CM

## 2014-05-19 DIAGNOSIS — I10 Essential (primary) hypertension: Secondary | ICD-10-CM | POA: Diagnosis not present

## 2014-05-19 DIAGNOSIS — F17200 Nicotine dependence, unspecified, uncomplicated: Secondary | ICD-10-CM

## 2014-05-19 LAB — HEPATIC FUNCTION PANEL
ALT: 12 U/L (ref 0–35)
AST: 22 U/L (ref 0–37)
Albumin: 4.2 g/dL (ref 3.5–5.2)
Alkaline Phosphatase: 77 U/L (ref 39–117)
Bilirubin, Direct: 0 mg/dL (ref 0.0–0.3)
Total Bilirubin: 0.5 mg/dL (ref 0.2–1.2)
Total Protein: 7 g/dL (ref 6.0–8.3)

## 2014-05-19 LAB — CBC WITH DIFFERENTIAL/PLATELET
Basophils Absolute: 0 10*3/uL (ref 0.0–0.1)
Basophils Relative: 0.7 % (ref 0.0–3.0)
Eosinophils Absolute: 0.1 10*3/uL (ref 0.0–0.7)
Eosinophils Relative: 2.4 % (ref 0.0–5.0)
HCT: 38.1 % (ref 36.0–46.0)
Hemoglobin: 13 g/dL (ref 12.0–15.0)
Lymphocytes Relative: 43 % (ref 12.0–46.0)
Lymphs Abs: 2.5 10*3/uL (ref 0.7–4.0)
MCHC: 34.2 g/dL (ref 30.0–36.0)
MCV: 95.5 fl (ref 78.0–100.0)
Monocytes Absolute: 0.2 10*3/uL (ref 0.1–1.0)
Monocytes Relative: 4 % (ref 3.0–12.0)
Neutro Abs: 3 10*3/uL (ref 1.4–7.7)
Neutrophils Relative %: 49.9 % (ref 43.0–77.0)
Platelets: 237 10*3/uL (ref 150.0–400.0)
RBC: 3.99 Mil/uL (ref 3.87–5.11)
RDW: 14.8 % (ref 11.5–15.5)
WBC: 5.9 10*3/uL (ref 4.0–10.5)

## 2014-05-19 LAB — LIPID PANEL
Cholesterol: 209 mg/dL — ABNORMAL HIGH (ref 0–200)
HDL: 81 mg/dL (ref >39.00)
LDL Cholesterol: 112 mg/dL — ABNORMAL HIGH (ref 0–99)
NonHDL: 128
Total CHOL/HDL Ratio: 3
Triglycerides: 78 mg/dL (ref 0.0–149.0)
VLDL: 15.6 mg/dL (ref 0.0–40.0)

## 2014-05-19 LAB — TSH: TSH: 9.93 u[IU]/mL — ABNORMAL HIGH (ref 0.35–4.50)

## 2014-05-19 LAB — BASIC METABOLIC PANEL
BUN: 11 mg/dL (ref 6–23)
CO2: 29 mEq/L (ref 19–32)
Calcium: 9.1 mg/dL (ref 8.4–10.5)
Chloride: 108 mEq/L (ref 96–112)
Creatinine, Ser: 0.92 mg/dL (ref 0.40–1.20)
GFR: 78.33 mL/min (ref >60.00)
Glucose, Bld: 102 mg/dL — ABNORMAL HIGH (ref 70–99)
Potassium: 4.2 mEq/L (ref 3.5–5.1)
Sodium: 141 mEq/L (ref 135–145)

## 2014-05-19 NOTE — Progress Notes (Signed)
Pre visit review using our clinic review tool, if applicable. No additional management support is needed unless otherwise documented below in the visit note. 

## 2014-05-19 NOTE — Progress Notes (Signed)
[name], female   DOB: [birth year], 69 y.o.   MRN: [id number]   Subjective:    [name], female    DOB: 11-25-1945, 69 y.o.   MRN: [id number]  HPI  Patient here for her physical exam.  Also has a history of hypothyroidism.  Here to f/u on this as well.  States she feels good.  Has been doing well.  Feels good.  Stays active.  No cardiac symptoms with increased activity or exertion.  No syncope or near syncope.  Still smoking marijuana.  Not smoking cigarettes.  Has really cut down her alcohol intake.  Does not drink weekly.  Discussed the need for colon cancer screening.  She declines colonoscopy.     Past Medical History  Diagnosis Date  . H/O syncope   . Hypertension   . Goiter     s/p thyroidectomy  . Hypercholesterolemia   . Recurrent sinus infections     and allergy problems    Current Outpatient Prescriptions on File Prior to Visit  Medication Sig Dispense Refill  . levothyroxine (SYNTHROID, LEVOTHROID) 50 MCG tablet Take 1 tablet (50 mcg total) by mouth daily. 90 tablet 1   No current facility-administered medications on file prior to visit.    Review of Systems  Constitutional: Negative for appetite change, fatigue and unexpected weight change.  HENT: Negative for congestion, sinus pressure and sore throat.   Eyes: Negative for pain and visual disturbance.  Respiratory: Negative for cough, chest tightness and shortness of breath.   Cardiovascular: Negative for chest pain, palpitations and leg swelling.  Gastrointestinal: Negative for nausea, vomiting, abdominal pain, diarrhea and constipation.  Genitourinary: Negative for frequency and difficulty urinating.  Musculoskeletal: Negative for back pain and joint swelling.  Skin: Negative for color change and rash.  Neurological: Negative for dizziness and headaches.  Hematological: Negative for adenopathy. Does not bruise/bleed easily.  Psychiatric/Behavioral: Negative for dysphoric mood and  decreased concentration.       Objective:    Physical Exam  Constitutional: She is oriented to person, place, and time. She appears well-developed and well-nourished.  HENT:  Nose: Nose normal.  Mouth/Throat: Oropharynx is clear and moist.  Eyes: Right eye exhibits no discharge. Left eye exhibits no discharge. No scleral icterus.  Neck: Neck supple. No thyromegaly present.  Cardiovascular: Normal rate and regular rhythm.   Pulmonary/Chest: Breath sounds normal. No accessory muscle usage. No tachypnea. No respiratory distress. She has no decreased breath sounds. She has no wheezes. She has no rhonchi. Right breast exhibits no inverted nipple, no mass, no nipple discharge and no tenderness (no axillary adenopathy). Left breast exhibits no inverted nipple, no mass, no nipple discharge and no tenderness (no axilarry adenopathy).  Abdominal: Soft. Bowel sounds are normal. There is no tenderness.  Musculoskeletal: She exhibits no edema or tenderness.  Lymphadenopathy:    She has no cervical adenopathy.  Neurological: She is alert and oriented to person, place, and time.  Skin: Skin is warm. No rash noted.  Psychiatric: She has a normal mood and affect. Her behavior is normal.    BP 150/80 mmHg  Pulse 59  Temp(Src) 98.4 F (36.9 C) (Oral)  Ht 5' 4.2" (1.631 m)  Wt 130 lb 8 oz (59.194 kg)  BMI 22.25 kg/m2  SpO2 99% Wt Readings from Last 3 Encounters:  05/19/14 130 lb 8 oz (59.194 kg)  07/09/13 136 lb (61.689 kg)  01/07/13 134 lb 12 oz (61.122 kg)     Lab  Results  Component Value Date   WBC 5.9 05/19/2014   HGB 13.0 05/19/2014   HCT 38.1 05/19/2014   PLT 237.0 05/19/2014   GLUCOSE 102* 05/19/2014   CHOL 209* 05/19/2014   TRIG 78.0 05/19/2014   HDL 81.00 05/19/2014   LDLCALC 112* 05/19/2014   ALT 12 05/19/2014   AST 22 05/19/2014   NA 141 05/19/2014   K 4.2 05/19/2014   CL 108 05/19/2014   CREATININE 0.92 05/19/2014   BUN 11 05/19/2014   CO2 29 05/19/2014   TSH 9.93*  05/19/2014       Assessment & Plan:   Problem List Items Addressed This Visit    Anemia    Check cbc.  Declines colonoscopy.  IFOB given.        Relevant Orders   CBC with Differential/Platelet (Completed)   Essential hypertension, benign    Blood pressure on recheck 138/84.  On no medication.  Have her spot check her pressure.  Follow.  Notify me if elevation.  Check metabolic panel.        Relevant Orders   Basic metabolic panel (Completed)   Health care maintenance    Physical today (05/19/13).  Declines colonoscopy.  Schedule mammogram.        Hypothyroidism - Primary    On thyroid medication.  Follow tsh.  Recheck today.        Relevant Orders   TSH (Completed)   Smoking    Smokes marijuana.  Discussed the need to stop.  Follow.         Other Visit Diagnoses    Elevated cholesterol        Relevant Orders    Hepatic function panel (Completed)    Lipid panel (Completed)    Breast cancer screening        Relevant Orders    MM DIGITAL SCREENING BILATERAL    Colon cancer screening        Relevant Orders    Fecal occult blood, imunochemical      I spent 25 minutes with the patient and more than 50% of the time was spent in consultation regarding the above.     Einar Pheasant, MD

## 2014-05-23 ENCOUNTER — Encounter: Payer: Self-pay | Admitting: Internal Medicine

## 2014-05-23 DIAGNOSIS — F17200 Nicotine dependence, unspecified, uncomplicated: Secondary | ICD-10-CM | POA: Insufficient documentation

## 2014-05-23 DIAGNOSIS — IMO0001 Reserved for inherently not codable concepts without codable children: Secondary | ICD-10-CM | POA: Insufficient documentation

## 2014-05-23 DIAGNOSIS — Z0000 Encounter for general adult medical examination without abnormal findings: Secondary | ICD-10-CM | POA: Insufficient documentation

## 2014-05-23 NOTE — Assessment & Plan Note (Signed)
Smokes marijuana.  Discussed the need to stop.  Follow.

## 2014-05-23 NOTE — Assessment & Plan Note (Signed)
Blood pressure on recheck 138/84.  On no medication.  Have her spot check her pressure.  Follow.  Notify me if elevation.  Check metabolic panel.

## 2014-05-23 NOTE — Assessment & Plan Note (Signed)
On thyroid medication.  Follow tsh.  Recheck today.

## 2014-05-23 NOTE — Assessment & Plan Note (Signed)
Physical today (05/19/13).  Declines colonoscopy.  Schedule mammogram.

## 2014-05-23 NOTE — Assessment & Plan Note (Signed)
Check cbc.  Declines colonoscopy.  IFOB given.

## 2014-06-07 ENCOUNTER — Other Ambulatory Visit (INDEPENDENT_AMBULATORY_CARE_PROVIDER_SITE_OTHER): Payer: Medicare Other

## 2014-06-07 DIAGNOSIS — Z1211 Encounter for screening for malignant neoplasm of colon: Secondary | ICD-10-CM | POA: Diagnosis not present

## 2014-06-07 LAB — FECAL OCCULT BLOOD, IMMUNOCHEMICAL: Fecal Occult Bld: POSITIVE — AB

## 2014-06-09 ENCOUNTER — Other Ambulatory Visit: Payer: Self-pay | Admitting: Internal Medicine

## 2014-06-09 DIAGNOSIS — R195 Other fecal abnormalities: Secondary | ICD-10-CM

## 2014-06-09 NOTE — Progress Notes (Signed)
Order placed for referral to gastroenterology.

## 2014-07-01 ENCOUNTER — Other Ambulatory Visit (INDEPENDENT_AMBULATORY_CARE_PROVIDER_SITE_OTHER): Payer: Medicare Other

## 2014-07-01 ENCOUNTER — Other Ambulatory Visit: Payer: Self-pay | Admitting: Internal Medicine

## 2014-07-01 DIAGNOSIS — E039 Hypothyroidism, unspecified: Secondary | ICD-10-CM | POA: Diagnosis not present

## 2014-07-01 LAB — TSH: TSH: 1.37 u[IU]/mL (ref 0.35–4.50)

## 2014-07-01 NOTE — Progress Notes (Signed)
order placed for tsh.

## 2014-07-02 ENCOUNTER — Other Ambulatory Visit: Payer: Self-pay | Admitting: Internal Medicine

## 2014-07-02 ENCOUNTER — Telehealth: Payer: Self-pay | Admitting: *Deleted

## 2014-07-02 ENCOUNTER — Encounter: Payer: Self-pay | Admitting: *Deleted

## 2014-07-02 DIAGNOSIS — E039 Hypothyroidism, unspecified: Secondary | ICD-10-CM

## 2014-07-02 NOTE — Progress Notes (Signed)
Order placed for f/u tsh.  

## 2014-07-02 NOTE — Telephone Encounter (Signed)
Pt returned call about labs. Results given, declined to schedule labs at this time, will call back.

## 2014-08-08 NOTE — Discharge Summary (Signed)
PATIENT NAME:  Jessica Wall, Jessica Wall MR#:  [id number] DATE OF BIRTH:  1945/10/02  DATE OF ADMISSION:  09/26/2011 DATE OF DISCHARGE:  09/27/2011  ADMISSION DIAGNOSIS: Syncope.   DISCHARGE DIAGNOSES: Syncope of unclear etiology.   CONSULTATIONS:  1. Dr. Loletta Specter.  2. Dr. Nehemiah Massed.   LABORATORY, DIAGNOSTIC AND RADIOLOGICAL DATA:  EEG was negative for acute seizures.  A 2-D echocardiogram showed normal ejection fraction, no valvular abnormalities.  Carotid Doppler showed no evidence of acute atherosclerotic disease.  MRI of the brain showed no acute finding. There might be a small vascular anomaly like a capillary telangiectasia.  White blood cells 5.3, hemoglobin 13.8, hematocrit 41, platelets are 204.  Sodium 143, potassium 3.9, chloride 110, bicarbonate 26, BUN 9, creatinine 0.98, glucose 95, cholesterol 180, triglycerides 55, HDL 79, VLDL 11, LDL 90.0.  Troponins x3 were negative.  TSH was normal at 3.77.  Urine culture: Greater than 100 gram-negative rods.   HOSPITAL COURSE: The patient is a 69 year old female who presented with syncope. For further details, please refer to the History and Physical.   1. Syncope: Unclear etiology, possibly related to bradycardia and elevated blood pressure. EEG, echocardiogram, Dopplers, MRI and CT of the head were pretty much unremarkable. I appreciate Neurology and Cardiology follow up. The patient will be discharged with a Holter monitor and follow up with Dr. Nehemiah Massed. The patient had no evidence of orthostasis during this hospitalization or another syncopal event. Her troponins were all negative.  2. Gram-negative urinary tract infection: Likely Escherichia coli for which the patient was treated with Rocephin. 3. Hypothyroidism: On Synthroid.  4. Tobacco abuse: The patient was counseled for three minutes, and had a patch placed, and will be discharged with a nicotine patch.  5. Elevated blood pressure: No diagnosis of hypertension. Her blood pressure was  fluctuating here. We added HCTZ and lisinopril. She will need followup with her primary care physician regarding this.   DISCHARGE MEDICATIONS:  1. Synthroid 0.075 mg daily.  2. Nicotine patch 7 mg every 24 hours.  3. Ciprofloxacin 500 b.i.d. for 10 days.  4. HCTZ 50 mg daily.  5. Lisinopril 10 mg daily.  6. Aspirin 81 mg daily.   DISCHARGE DIET: Low sodium.   DISCHARGE ACTIVITY: No exertion or heavy lifting.   DISCHARGE FOLLOWUP:  1. The patient can follow up with Dr. Nicki Reaper for her blood pressure.  2. The patient will follow up with Dr. Nehemiah Massed in 3 to 4 days regarding the Holter monitor.   The patient is medically stable for discharge  ____________________________ Sital P. Benjie Karvonen, MD spm:cbb D: 09/27/2011 13:01:43 ET T: 09/28/2011 10:09:13 ET JOB#: [id number]  cc: Sital P. Benjie Karvonen, MD, <Dictator> Einar Pheasant, MD Donell Beers MODY MD ELECTRONICALLY SIGNED 09/28/2011 13:34
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
## 2014-10-11 DIAGNOSIS — R195 Other fecal abnormalities: Secondary | ICD-10-CM | POA: Diagnosis not present

## 2014-11-03 ENCOUNTER — Ambulatory Visit: Payer: Medicare Other | Admitting: Anesthesiology

## 2014-11-03 ENCOUNTER — Encounter: Payer: Self-pay | Admitting: Anesthesiology

## 2014-11-03 ENCOUNTER — Ambulatory Visit
Admission: RE | Admit: 2014-11-03 | Discharge: 2014-11-03 | Disposition: A | Discharge status: Home / Self Care | Payer: Medicare Other | Source: Ambulatory Visit | Attending: Unknown Physician Specialty | Admitting: Unknown Physician Specialty

## 2014-11-03 ENCOUNTER — Encounter
Admission: RE | Disposition: A | Discharge status: Home / Self Care | Payer: Self-pay | Source: Ambulatory Visit | Attending: Unknown Physician Specialty

## 2014-11-03 DIAGNOSIS — K648 Other hemorrhoids: Secondary | ICD-10-CM | POA: Insufficient documentation

## 2014-11-03 DIAGNOSIS — Z79899 Other long term (current) drug therapy: Secondary | ICD-10-CM | POA: Insufficient documentation

## 2014-11-03 DIAGNOSIS — K210 Gastro-esophageal reflux disease with esophagitis: Secondary | ICD-10-CM | POA: Diagnosis not present

## 2014-11-03 DIAGNOSIS — D125 Benign neoplasm of sigmoid colon: Secondary | ICD-10-CM | POA: Insufficient documentation

## 2014-11-03 DIAGNOSIS — D649 Anemia, unspecified: Secondary | ICD-10-CM | POA: Insufficient documentation

## 2014-11-03 DIAGNOSIS — E780 Pure hypercholesterolemia: Secondary | ICD-10-CM | POA: Insufficient documentation

## 2014-11-03 DIAGNOSIS — E039 Hypothyroidism, unspecified: Secondary | ICD-10-CM | POA: Insufficient documentation

## 2014-11-03 DIAGNOSIS — K2960 Other gastritis without bleeding: Secondary | ICD-10-CM | POA: Diagnosis not present

## 2014-11-03 DIAGNOSIS — D120 Benign neoplasm of cecum: Secondary | ICD-10-CM | POA: Insufficient documentation

## 2014-11-03 DIAGNOSIS — I10 Essential (primary) hypertension: Secondary | ICD-10-CM | POA: Diagnosis not present

## 2014-11-03 DIAGNOSIS — K2970 Gastritis, unspecified, without bleeding: Secondary | ICD-10-CM | POA: Insufficient documentation

## 2014-11-03 DIAGNOSIS — K635 Polyp of colon: Secondary | ICD-10-CM | POA: Diagnosis not present

## 2014-11-03 DIAGNOSIS — R195 Other fecal abnormalities: Secondary | ICD-10-CM | POA: Diagnosis present

## 2014-11-03 DIAGNOSIS — F17210 Nicotine dependence, cigarettes, uncomplicated: Secondary | ICD-10-CM | POA: Insufficient documentation

## 2014-11-03 DIAGNOSIS — D123 Benign neoplasm of transverse colon: Secondary | ICD-10-CM | POA: Insufficient documentation

## 2014-11-03 HISTORY — PX: ESOPHAGOGASTRODUODENOSCOPY: SHX5428

## 2014-11-03 HISTORY — PX: COLONOSCOPY WITH PROPOFOL: SHX5780

## 2014-11-03 LAB — HM COLONOSCOPY

## 2014-11-03 SURGERY — COLONOSCOPY WITH PROPOFOL
Anesthesia: General

## 2014-11-03 MED ORDER — LIDOCAINE HCL (PF) 2 % IJ SOLN
INTRAMUSCULAR | Status: DC | PRN
  Administered 2014-11-03: 50 mg

## 2014-11-03 MED ORDER — METHYLENE BLUE 1 % INJ SOLN
INTRAMUSCULAR | Status: AC
  Filled 2014-11-03: qty 1

## 2014-11-03 MED ORDER — EPHEDRINE SULFATE 50 MG/ML IJ SOLN
INTRAMUSCULAR | Status: DC | PRN
  Administered 2014-11-03: 10 mg via INTRAVENOUS
  Administered 2014-11-03: 15 mg via INTRAVENOUS

## 2014-11-03 MED ORDER — SODIUM CHLORIDE 0.9 % IV SOLN
INTRAVENOUS | Status: DC
Start: 2014-11-03 — End: 2014-11-03

## 2014-11-03 MED ORDER — FENTANYL CITRATE (PF) 100 MCG/2ML IJ SOLN
INTRAMUSCULAR | Status: DC | PRN
  Administered 2014-11-03: 100 ug via INTRAVENOUS

## 2014-11-03 MED ORDER — PROPOFOL 10 MG/ML IV BOLUS
INTRAVENOUS | Status: DC | PRN
  Administered 2014-11-03: 50 mg via INTRAVENOUS

## 2014-11-03 MED ORDER — MIDAZOLAM HCL 5 MG/5ML IJ SOLN
INTRAMUSCULAR | Status: DC | PRN
  Administered 2014-11-03: 2 mg via INTRAVENOUS

## 2014-11-03 MED ORDER — PROPOFOL INFUSION 10 MG/ML OPTIME
INTRAVENOUS | Status: DC | PRN
  Administered 2014-11-03: 120 ug/kg/min via INTRAVENOUS

## 2014-11-03 MED ORDER — SODIUM CHLORIDE 0.9 % IV SOLN
INTRAVENOUS | Status: DC
  Administered 2014-11-03: 15:00:00 via INTRAVENOUS

## 2014-11-03 NOTE — Anesthesia Postprocedure Evaluation (Signed)
  Anesthesia Post-op Note  Patient: Jessica Wall  Procedure(s) Performed: Procedure(s): COLONOSCOPY WITH PROPOFOL (N/A) ESOPHAGOGASTRODUODENOSCOPY (EGD) (N/A)  Anesthesia type:General  Patient location: PACU  Post pain: Pain level controlled  Post assessment: Post-op Vital signs reviewed, Patient's Cardiovascular Status Stable, Respiratory Function Stable, Patent Airway and No signs of Nausea or vomiting  Post vital signs: Reviewed and stable  Last Vitals:  Filed Vitals:   11/03/14 1732  BP:   Pulse: 56  Temp:   Resp: 23    Level of consciousness: awake, alert  and patient cooperative  Complications: No apparent anesthesia complications

## 2014-11-03 NOTE — Anesthesia Preprocedure Evaluation (Signed)
Anesthesia Evaluation  Patient identified by MRN, date of birth, ID band Patient awake    Reviewed: Allergy & Precautions, NPO status , Patient's Chart, lab work & pertinent test results, reviewed documented beta blocker date and time   Airway Mallampati: II  TM Distance: >3 FB     Dental  (+) Chipped   Pulmonary Current Smoker,          Cardiovascular hypertension, Pt. on medications     Neuro/Psych    GI/Hepatic   Endo/Other  Hypothyroidism   Renal/GU      Musculoskeletal   Abdominal   Peds  Hematology  (+) anemia ,   Anesthesia Other Findings   Reproductive/Obstetrics                             Anesthesia Physical Anesthesia Plan  ASA: II  Anesthesia Plan: General   Post-op Pain Management:    Induction: Intravenous  Airway Management Planned: Nasal Cannula  Additional Equipment:   Intra-op Plan:   Post-operative Plan:   Informed Consent: I have reviewed the patients History and Physical, chart, labs and discussed the procedure including the risks, benefits and alternatives for the proposed anesthesia with the patient or authorized representative who has indicated his/her understanding and acceptance.     Plan Discussed with: CRNA  Anesthesia Plan Comments:         Anesthesia Quick Evaluation

## 2014-11-03 NOTE — Op Note (Signed)
[facility] Patient Name: Jessica Wall Procedure Date: 11/03/2014 3:34 PM MRN: [id number] Account #: [id number] Date of Birth: [birth year] Admit Type: Outpatient Age: 69 Room: [facility] ENDO ROOM 4 Gender: Female Note Status: Finalized Procedure:         Upper GI endoscopy Indications:       Heme positive stool Providers:         Manya Silvas, MD Referring MD:      Einar Pheasant, MD (Referring MD) Medicines:         Propofol per Anesthesia Complications:     No immediate complications. Procedure:         Pre-Anesthesia Assessment:                    - After reviewing the risks and benefits, the patient was                     deemed in satisfactory condition to undergo the procedure.                    After obtaining informed consent, the endoscope was passed                     under direct vision. Throughout the procedure, the                     patient's blood pressure, pulse, and oxygen saturations                     were monitored continuously. The Endoscope was introduced                     through the mouth, and advanced to the second part of                     duodenum. The upper GI endoscopy was accomplished without                     difficulty. The patient tolerated the procedure well. Findings:      LA Grade A (one or more mucosal breaks less than 5 mm, not extending       between tops of 2 mucosal folds) esophagitis with no bleeding was found       41 cm from the incisors. Biopsies were taken with a cold forceps for       histology.      Patchy mild inflammation characterized by erythema and granularity was       found in the gastric antrum. Biopsies were taken with a cold forceps for       histology. Biopsies were taken with a cold forceps for Helicobacter       pylori testing.      Patchy mildly erythematous mucosa without active bleeding and with no       stigmata of bleeding was found in the duodenal bulb. Impression:         - LA Grade A reflux esophagitis. Biopsied.                    - Gastritis. Biopsied.                    - Normal examined duodenum. Recommendation:    - Await pathology results. Manya Silvas, MD 11/03/2014 3:52:46 PM This report has been signed electronically. Number  of Addenda: 0 Note Initiated On: 11/03/2014 3:34 PM      [facility]

## 2014-11-03 NOTE — Op Note (Signed)
[facility] Patient Name: Jessica Wall Procedure Date: 11/03/2014 3:33 PM MRN: [id number] Account #: [id number] Date of Birth: Oct 05, 1945 Admit Type: Outpatient Age: 69 Room: [facility] ENDO ROOM 4 Gender: Female Note Status: Finalized Procedure:         Colonoscopy Indications:       Heme positive stool Providers:         Manya Silvas, MD Referring MD:      Einar Pheasant, MD (Referring MD) Medicines:         Propofol per Anesthesia Complications:     No immediate complications. Procedure:         Pre-Anesthesia Assessment:                    - After reviewing the risks and benefits, the patient was                     deemed in satisfactory condition to undergo the procedure.                    After obtaining informed consent, the colonoscope was                     passed under direct vision. Throughout the procedure, the                     patient's blood pressure, pulse, and oxygen saturations                     were monitored continuously. The Colonoscope was                     introduced through the anus and advanced to the the cecum,                     identified by the ileocecal valve. The colonoscopy was                     performed without difficulty. The patient tolerated the                     procedure well. The quality of the bowel preparation was                     excellent. Findings:      A large polyp was found in the cecum. The polyp was sessile. Biopsies       were taken with a cold forceps for histology. This was on top of the       appendiceal orifice and was large. Biopsied twice with jumbo forceps.       This will need to be removed with surgery, possible laproscopic cecum       removal or right colon resection.      Multiple polyps were removed from other areas with hot snare.      A small polyp was found at the hepatic flexure. The polyp was sessile.       The polyp was removed with a hot snare. Resection and  retrieval were       complete.      A diminutive polyp was found in the cecum. The polyp was sessile. The       polyp was removed with a jumbo cold forceps. Resection and retrieval       were complete.  Two sessile polyps were found in the transverse colon. The polyps were       diminutive in size. These polyps were removed with a jumbo cold forceps.       Resection and retrieval were complete.      Four sessile polyps were found in the sigmoid colon. The polyps were       small in size. These polyps were removed with a hot snare. Resection and       retrieval were complete.      Four sessile polyps were found in the recto-sigmoid colon. The polyps       were small in size. These polyps were removed with a hot snare.       Resection and retrieval were complete.      The exam was otherwise without abnormality. Impression:        - One large polyp in the cecum. Biopsied.                    - One small polyp at the hepatic flexure. Resected and                     retrieved.                    - One diminutive polyp in the cecum. Resected and                     retrieved.                    - Two diminutive polyps in the transverse colon. Resected                     and retrieved.                    - Four small polyps in the sigmoid colon. Resected and                     retrieved.                    - Four small polyps at the recto-sigmoid colon. Resected                     and retrieved.                    - The examination was otherwise normal. Recommendation:    - Await pathology results. Manya Silvas, MD 11/03/2014 4:51:43 PM This report has been signed electronically. Number of Addenda: 0 Note Initiated On: 11/03/2014 3:33 PM Scope Withdrawal Time: 0 hours 36 minutes 44 seconds  Total Procedure Duration: 0 hours 43 minutes 45 seconds       [facility]

## 2014-11-03 NOTE — Transfer of Care (Signed)
Immediate Anesthesia Transfer of Care Note  Patient: Jessica Wall  Procedure(s) Performed: Procedure(s): COLONOSCOPY WITH PROPOFOL (N/A) ESOPHAGOGASTRODUODENOSCOPY (EGD) (N/A)  Patient Location: PACU  Anesthesia Type:General  Level of Consciousness: sedated  Airway & Oxygen Therapy: Patient Spontanous Breathing and Patient connected to nasal cannula oxygen  Post-op Assessment: Report given to RN and Post -op Vital signs reviewed and stable  Post vital signs: Reviewed and stable  Last Vitals:  Filed Vitals:   11/03/14 1425  BP: 173/81  Pulse:   Temp:   Resp:     Complications: No apparent anesthesia complications

## 2014-11-03 NOTE — H&P (Signed)
Primary Care Physician:  Einar Pheasant, MD Primary Gastroenterologist:  Dr. Vira Agar  Pre-Procedure History & Physical: HPI:  Jessica Wall is a 69 y.o. female is here for an endoscopy and colonoscopy.   Past Medical History  Diagnosis Date  . H/O syncope   . Hypertension   . Goiter     s/p thyroidectomy  . Hypercholesterolemia   . Recurrent sinus infections     and allergy problems    Past Surgical History  Procedure Laterality Date  . Thyroidectomy  1979    goiter  . Tubal ligation  1975  . Dilation and curettage of uterus  [place]    Prior to Admission medications   Medication Sig Start Date End Date Taking? Authorizing Provider  levothyroxine (SYNTHROID, LEVOTHROID) 50 MCG tablet Take 1 tablet (50 mcg total) by mouth daily. 05/14/14  Yes Einar Pheasant, MD    Allergies as of 10/11/2014  . (No Known Allergies)    Family History  Problem Relation Age of Onset  . Tuberculosis Paternal Grandfather   . Alzheimer's disease Mother   . Goiter Mother     History   Social History  . Marital Status: Married    Spouse Name: N/A  . Number of Children: N/A  . Years of Education: N/A   Occupational History  . Not on file.   Social History Main Topics  . Smoking status: Current Some Day Smoker  . Smokeless tobacco: Never Used  . Alcohol Use: 0.0 oz/week    0 Standard drinks or equivalent per week  . Drug Use: Yes     Comment: marijuana  . Sexual Activity: Not on file   Other Topics Concern  . Not on file   Social History Narrative    Review of Systems: See HPI, otherwise negative ROS  Physical Exam: BP 173/81 mmHg  Pulse 58  Temp(Src) 97.2 F (36.2 C) (Tympanic)  Resp 16  Ht 5\' 4"  (1.626 m)  Wt 58.06 kg (128 lb)  BMI 21.96 kg/m2  SpO2 100% General:   Alert,  pleasant and cooperative in NAD Head:  Normocephalic and atraumatic. Neck:  Supple; no masses or thyromegaly. Lungs:  Clear throughout to auscultation.    Heart:  Regular rate and  rhythm. Abdomen:  Soft, nontender and nondistended. Normal bowel sounds, without guarding, and without rebound.   Neurologic:  Alert and  oriented x4;  grossly normal neurologically.  Impression/Plan: Jessica Wall is here for an endoscopy and colonoscopy to be performed for Heme positive stool  Risks, benefits, limitations, and alternatives regarding  endoscopy and colonoscopy have been reviewed with the patient.  Questions have been answered.  All parties agreeable.   Gaylyn Cheers, MD  11/03/2014, 3:35 PM   Primary Care Physician:  Einar Pheasant, MD Primary Gastroenterologist:  Dr. Vira Agar  Pre-Procedure History & Physical: HPI:  Jessica Wall is a 69 y.o. female is here for an endoscopy and colonoscopy.   Past Medical History  Diagnosis Date  . H/O syncope   . Hypertension   . Goiter     s/p thyroidectomy  . Hypercholesterolemia   . Recurrent sinus infections     and allergy problems    Past Surgical History  Procedure Laterality Date  . Thyroidectomy  1979    goiter  . Tubal ligation  1975  . Dilation and curettage of uterus  [place]    Prior to Admission medications   Medication Sig Start Date End Date Taking? Authorizing Provider  levothyroxine (SYNTHROID, LEVOTHROID) 50 MCG tablet Take 1 tablet (50 mcg total) by mouth daily. 05/14/14  Yes Einar Pheasant, MD    Allergies as of 10/11/2014  . (No Known Allergies)    Family History  Problem Relation Age of Onset  . Tuberculosis Paternal Grandfather   . Alzheimer's disease Mother   . Goiter Mother     History   Social History  . Marital Status: Married    Spouse Name: N/A  . Number of Children: N/A  . Years of Education: N/A   Occupational History  . Not on file.   Social History Main Topics  . Smoking status: Current Some Day Smoker  . Smokeless tobacco: Never Used  . Alcohol Use: 0.0 oz/week    0 Standard drinks or equivalent per week  . Drug Use: Yes     Comment: marijuana  . Sexual  Activity: Not on file   Other Topics Concern  . Not on file   Social History Narrative    Review of Systems: See HPI, otherwise negative ROS  Physical Exam: BP 173/81 mmHg  Pulse 58  Temp(Src) 97.2 F (36.2 C) (Tympanic)  Resp 16  Ht 5\' 4"  (1.626 m)  Wt 58.06 kg (128 lb)  BMI 21.96 kg/m2  SpO2 100% General:   Alert,  pleasant and cooperative in NAD Head:  Normocephalic and atraumatic. Neck:  Supple; no masses or thyromegaly. Lungs:  Clear throughout to auscultation.    Heart:  Regular rate and rhythm. Abdomen:  Soft, nontender and nondistended. Normal bowel sounds, without guarding, and without rebound.   Neurologic:  Alert and  oriented x4;  grossly normal neurologically.  Impression/Plan: Jessica Wall is here for an endoscopy and colonoscopy to be performed for heme positive stool  Risks, benefits, limitations, and alternatives regarding  endoscopy and colonoscopy have been reviewed with the patient.  Questions have been answered.  All parties agreeable.   Gaylyn Cheers, MD  11/03/2014, 3:35 PM

## 2014-11-05 ENCOUNTER — Telehealth: Payer: Self-pay | Admitting: *Deleted

## 2014-11-05 DIAGNOSIS — K635 Polyp of colon: Secondary | ICD-10-CM

## 2014-11-05 LAB — SURGICAL PATHOLOGY

## 2014-11-05 NOTE — Telephone Encounter (Signed)
I can place an order for the referral to the surgeon.  Does she have a preference of which physician she wants to see.  If no, I use Dr Bary Castilla.  If agreeable, let me know and I will place the order for the referral.

## 2014-11-05 NOTE — Telephone Encounter (Signed)
Pt called and stated that Dr. Vira Agar removed 8 polyps during her Colonscopy. He mentioned the one could not be removed and would require surgery. Patients states that Dr. Vira Agar does not do that particular surgery & was told that she needed to contact her PCP so you could get her setup for surgery. Please advise.

## 2014-11-05 NOTE — Telephone Encounter (Signed)
She has no preference

## 2014-11-06 NOTE — Telephone Encounter (Signed)
Order placed for GI referral.   

## 2014-11-08 ENCOUNTER — Encounter: Payer: Self-pay | Admitting: Unknown Physician Specialty

## 2014-11-11 ENCOUNTER — Ambulatory Visit (INDEPENDENT_AMBULATORY_CARE_PROVIDER_SITE_OTHER): Payer: Medicare Other | Admitting: General Surgery

## 2014-11-11 VITALS — BP 128/74 | HR 74 | Resp 12 | Ht 63.0 in | Wt 130.0 lb

## 2014-11-11 DIAGNOSIS — Z86010 Personal history of colonic polyps: Secondary | ICD-10-CM | POA: Diagnosis not present

## 2014-11-11 NOTE — Patient Instructions (Signed)
Patient's surgery has been scheduled for 11-26-14 at [facility].

## 2014-11-11 NOTE — Progress Notes (Signed)
[name], female   DOB: 22-Jun-1945, 69 y.o.   MRN: [id number]  Chief Complaint  Patient presents with  . Follow-up    colonoscopy done on 11/03/14    HPI Jessica Wall is a 69 y.o. female here today for a follow up from a colonoscopy done by Gaylyn Cheers on 11/03/14. The patient had put off having a colonoscopy for quite some time. Her recent exam showed multiple polyps. She was found to have a polyp at the appendiceal orifice which was too large for endoscopic removal. She is seen now to discuss surgical therapy.  The patient reports no difficulty with her bowels. Regular, soft illumination.  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   HPI  Past Medical History  Diagnosis Date  . H/O syncope   . Hypertension   . Goiter     s/p thyroidectomy  . Hypercholesterolemia   . Recurrent sinus infections     and allergy problems    Past Surgical History  Procedure Laterality Date  . Thyroidectomy  1979    goiter  . Tubal ligation  1975  . Dilation and curettage of uterus  [place]  . Colonoscopy with propofol N/A 11/03/2014    Procedure: COLONOSCOPY WITH PROPOFOL;  Surgeon: Manya Silvas, MD;  Location: [facility] ENDOSCOPY;  Service: Endoscopy;  Laterality: N/A;  . Esophagogastroduodenoscopy N/A 11/03/2014    Procedure: ESOPHAGOGASTRODUODENOSCOPY (EGD);  Surgeon: Manya Silvas, MD;  Location: [facility] ENDOSCOPY;  Service: Endoscopy;  Laterality: N/A;    Family History  Problem Relation Age of Onset  . Tuberculosis Paternal Grandfather   . Alzheimer's disease Mother   . Goiter Mother     Social History History  Substance Use Topics  . Smoking status: Current Some Day Smoker  . Smokeless tobacco: Never Used  . Alcohol Use: 0.0 oz/week    0 Standard drinks or equivalent per week    No Known Allergies  Current Outpatient Prescriptions  Medication Sig Dispense Refill  . levothyroxine (SYNTHROID, LEVOTHROID) 50 MCG tablet Take  1 tablet (50 mcg total) by mouth daily. 90 tablet 1  . omeprazole (PRILOSEC) 40 MG capsule Take 40 mg by mouth daily.     No current facility-administered medications for this visit.    Review of Systems Review of Systems  Constitutional: Negative.   Respiratory: Negative.   Cardiovascular: Negative.   Gastrointestinal: Negative.   All other systems reviewed and are negative.   Blood pressure 128/74, pulse 74, resp. rate 12, height 5\' 3"  (1.6 m), weight 130 lb (58.968 kg).  Physical Exam Physical Exam  Constitutional: She is oriented to person, place, and time. She appears well-developed and well-nourished.  HENT:  Head: Normocephalic.  Eyes: Conjunctivae are normal. No scleral icterus.  Neck: Neck supple. No thyromegaly present.    Cardiovascular: Normal rate, regular rhythm and normal heart sounds.   Pulmonary/Chest: Effort normal and breath sounds normal.  Abdominal: Soft. Bowel sounds are normal. There is no tenderness.    Musculoskeletal: Normal range of motion.  Lymphadenopathy:    She has no cervical adenopathy.  Neurological: She is alert and oriented to person, place, and time.  Skin: Skin is warm and dry.  Psychiatric: She has a normal mood and affect. Judgment normal.    Data Reviewed 11/03/2014 endoscopic report and pathology reviewed.  Biopsies of the stomach showed mild chronic gastritis. No H. pylori. Biopsy of the GE junction showed mild chronic inflammatory changes.  The  appendiceal orifice polyp showed a tubulovillous adenoma without high-grade dysplasia. Polyps of the hepatic flexure, cecum, proximal transverse colon showed tubular adenomas. Polyps of the rectosigmoid, sigmoid, and hepatic flexure showed hyperplastic changes. The lesion in the sigmoid was thought to represent a serrated adenoma.  Laboratory studies from February 2016 including CBC, basic metabolic panel and TSH were reviewed. With the exception of a scant elevation of the serum  chloride all are within normal limits.  Assessment    Tubulovillous adenoma of the appendiceal orifice.    Plan    Dictations for polyp removal were reviewed. Without findings of dysplasia on pathology, I think it reasonable to plan for endoscopic removal of the cecum without formal right hemicolectomy. Should malignancy be identified, the patient was informed that formal right colectomy would be indicated.     Patient to be scheduled for a colon propyl removal. A bowel prep will not be necessary for this procedure. I anticipate that she will be able to go home the day of surgery. She is been instructed that she should not drive until she is pain-free.  Patient's surgery has been scheduled for 11-26-14 at [facility].  PCP:  Einar Pheasant Ref. Dr. Laury Axon, [place] 11/11/2014, 8:06 PM

## 2014-11-11 NOTE — H&P (Signed)
[name], female DOB: 09-05-1945, 69 y.o. MRN: [id number]  Chief Complaint   Patient presents with   .  Follow-up     colonoscopy done on 11/03/14    HPI  Jessica Wall is a 69 y.o. female here today for a follow up from a colonoscopy done by Gaylyn Cheers on 11/03/14. The patient had put off having a colonoscopy for quite some time. Her recent exam showed multiple polyps. She was found to have a polyp at the appendiceal orifice which was too large for endoscopic removal. She is seen now to discuss surgical therapy.  The patient reports no difficulty with her bowels. Regular, soft illumination.  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  HPI  Past Medical History   Diagnosis  Date   .  H/O syncope    .  Hypertension    .  Goiter      s/p thyroidectomy   .  Hypercholesterolemia    .  Recurrent sinus infections      and allergy problems    Past Surgical History   Procedure  Laterality  Date   .  Thyroidectomy   1979     goiter   .  Tubal ligation   1975   .  Dilation and curettage of uterus   [place]   .  Colonoscopy with propofol  N/A  11/03/2014     Procedure: COLONOSCOPY WITH PROPOFOL; Surgeon: Manya Silvas, MD; Location: [facility] ENDOSCOPY; Service: Endoscopy; Laterality: N/A;   .  Esophagogastroduodenoscopy  N/A  11/03/2014     Procedure: ESOPHAGOGASTRODUODENOSCOPY (EGD); Surgeon: Manya Silvas, MD; Location: [facility] ENDOSCOPY; Service: Endoscopy; Laterality: N/A;    Family History   Problem  Relation  Age of Onset   .  Tuberculosis  Paternal Grandfather    .  Alzheimer's disease  Mother    .  Goiter  Mother     Social History  History   Substance Use Topics   .  Smoking status:  Current Some Day Smoker   .  Smokeless tobacco:  Never Used   .  Alcohol Use:  0.0 oz/week     0 Standard drinks or equivalent per week    No Known Allergies  Current Outpatient Prescriptions   Medication  Sig  Dispense  Refill   .   levothyroxine (SYNTHROID, LEVOTHROID) 50 MCG tablet  Take 1 tablet (50 mcg total) by mouth daily.  90 tablet  1   .  omeprazole (PRILOSEC) 40 MG capsule  Take 40 mg by mouth daily.      No current facility-administered medications for this visit.    Review of Systems  Review of Systems  Constitutional: Negative.  Respiratory: Negative.  Cardiovascular: Negative.  Gastrointestinal: Negative.  All other systems reviewed and are negative.   Blood pressure 128/74, pulse 74, resp. rate 12, height 5\' 3"  (1.6 m), weight 130 lb (58.968 kg).  Physical Exam  Physical Exam  Constitutional: She is oriented to person, place, and time. She appears well-developed and well-nourished.  HENT:  Head: Normocephalic.  Eyes: Conjunctivae are normal. No scleral icterus.  Neck: Neck supple. No thyromegaly present.    Cardiovascular: Normal rate, regular rhythm and normal heart sounds.  Pulmonary/Chest: Effort normal and breath sounds normal.  Abdominal: Soft. Bowel sounds are normal. There is no tenderness.    Musculoskeletal: Normal range of motion.  Lymphadenopathy:  She has no cervical adenopathy.  Neurological: She is  alert and oriented to person, place, and time.  Skin: Skin is warm and dry.  Psychiatric: She has a normal mood and affect. Judgment normal.   Data Reviewed  11/03/2014 endoscopic report and pathology reviewed.  Biopsies of the stomach showed mild chronic gastritis. No H. pylori. Biopsy of the GE junction showed mild chronic inflammatory changes.  The appendiceal orifice polyp showed a tubulovillous adenoma without high-grade dysplasia. Polyps of the hepatic flexure, cecum, proximal transverse colon showed tubular adenomas. Polyps of the rectosigmoid, sigmoid, and hepatic flexure showed hyperplastic changes. The lesion in the sigmoid was thought to represent a serrated adenoma.  Laboratory studies from February 2016 including CBC, basic metabolic panel and TSH were reviewed. With the  exception of a scant elevation of the serum chloride all are within normal limits.  Assessment   Tubulovillous adenoma of the appendiceal orifice.   Plan   Dictations for polyp removal were reviewed. Without findings of dysplasia on pathology, I think it reasonable to plan for endoscopic removal of the cecum without formal right hemicolectomy. Should malignancy be identified, the patient was informed that formal right colectomy would be indicated.  Patient to be scheduled for a colon propyl removal. A bowel prep will not be necessary for this procedure. I anticipate that she will be able to go home the day of surgery. She is been instructed that she should not drive until she is pain-free.  Patient's surgery has been scheduled for 11-26-14 at [facility].  PCP: Einar Pheasant  Ref. Dr. Laury Axon, [place]  11/11/2014, 8:06 PM

## 2014-11-12 ENCOUNTER — Encounter: Payer: Self-pay | Admitting: Internal Medicine

## 2014-11-15 ENCOUNTER — Encounter: Payer: Self-pay | Admitting: Internal Medicine

## 2014-11-15 DIAGNOSIS — K209 Esophagitis, unspecified without bleeding: Secondary | ICD-10-CM | POA: Insufficient documentation

## 2014-11-15 DIAGNOSIS — Z860100 Personal history of colon polyps, unspecified: Secondary | ICD-10-CM | POA: Insufficient documentation

## 2014-11-17 ENCOUNTER — Encounter: Payer: Self-pay | Admitting: Internal Medicine

## 2014-11-17 ENCOUNTER — Ambulatory Visit (INDEPENDENT_AMBULATORY_CARE_PROVIDER_SITE_OTHER): Payer: Medicare Other | Admitting: Internal Medicine

## 2014-11-17 VITALS — BP 120/80 | HR 56 | Temp 98.8°F | Ht 63.0 in | Wt 131.2 lb

## 2014-11-17 DIAGNOSIS — E7800 Pure hypercholesterolemia, unspecified: Secondary | ICD-10-CM

## 2014-11-17 DIAGNOSIS — E039 Hypothyroidism, unspecified: Secondary | ICD-10-CM | POA: Diagnosis not present

## 2014-11-17 DIAGNOSIS — Z86010 Personal history of colonic polyps: Secondary | ICD-10-CM

## 2014-11-17 DIAGNOSIS — K209 Esophagitis, unspecified without bleeding: Secondary | ICD-10-CM

## 2014-11-17 DIAGNOSIS — Z0000 Encounter for general adult medical examination without abnormal findings: Secondary | ICD-10-CM

## 2014-11-17 DIAGNOSIS — I10 Essential (primary) hypertension: Secondary | ICD-10-CM

## 2014-11-17 DIAGNOSIS — D649 Anemia, unspecified: Secondary | ICD-10-CM

## 2014-11-17 DIAGNOSIS — Z01818 Encounter for other preprocedural examination: Secondary | ICD-10-CM

## 2014-11-17 LAB — LIPID PANEL
Cholesterol: 214 mg/dL — ABNORMAL HIGH (ref 0–200)
HDL: 76.4 mg/dL (ref >39.00)
LDL Cholesterol: 123 mg/dL — ABNORMAL HIGH (ref 0–99)
NonHDL: 138
Total CHOL/HDL Ratio: 3
Triglycerides: 77 mg/dL (ref 0.0–149.0)
VLDL: 15.4 mg/dL (ref 0.0–40.0)

## 2014-11-17 LAB — BASIC METABOLIC PANEL
BUN: 15 mg/dL (ref 6–23)
CO2: 28 mEq/L (ref 19–32)
Calcium: 8.9 mg/dL (ref 8.4–10.5)
Chloride: 106 mEq/L (ref 96–112)
Creatinine, Ser: 1.02 mg/dL (ref 0.40–1.20)
GFR: 69.43 mL/min (ref >60.00)
Glucose, Bld: 92 mg/dL (ref 70–99)
Potassium: 4.2 mEq/L (ref 3.5–5.1)
Sodium: 141 mEq/L (ref 135–145)

## 2014-11-17 LAB — CBC WITH DIFFERENTIAL/PLATELET
Basophils Absolute: 0 10*3/uL (ref 0.0–0.1)
Basophils Relative: 0.7 % (ref 0.0–3.0)
Eosinophils Absolute: 0.1 10*3/uL (ref 0.0–0.7)
Eosinophils Relative: 2.3 % (ref 0.0–5.0)
HCT: 37 % (ref 36.0–46.0)
Hemoglobin: 12.5 g/dL (ref 12.0–15.0)
Lymphocytes Relative: 46.7 % — ABNORMAL HIGH (ref 12.0–46.0)
Lymphs Abs: 2.4 10*3/uL (ref 0.7–4.0)
MCHC: 33.7 g/dL (ref 30.0–36.0)
MCV: 96.5 fl (ref 78.0–100.0)
Monocytes Absolute: 0.2 10*3/uL (ref 0.1–1.0)
Monocytes Relative: 4.3 % (ref 3.0–12.0)
Neutro Abs: 2.4 10*3/uL (ref 1.4–7.7)
Neutrophils Relative %: 46 % (ref 43.0–77.0)
Platelets: 221 10*3/uL (ref 150.0–400.0)
RBC: 3.84 Mil/uL — ABNORMAL LOW (ref 3.87–5.11)
RDW: 14.2 % (ref 11.5–15.5)
WBC: 5.1 10*3/uL (ref 4.0–10.5)

## 2014-11-17 LAB — HEPATIC FUNCTION PANEL
ALT: 12 U/L (ref 0–35)
AST: 18 U/L (ref 0–37)
Albumin: 4 g/dL (ref 3.5–5.2)
Alkaline Phosphatase: 60 U/L (ref 39–117)
Bilirubin, Direct: 0.1 mg/dL (ref 0.0–0.3)
Total Bilirubin: 0.3 mg/dL (ref 0.2–1.2)
Total Protein: 6.5 g/dL (ref 6.0–8.3)

## 2014-11-17 LAB — TSH: TSH: 1.84 u[IU]/mL (ref 0.35–4.50)

## 2014-11-17 MED ORDER — LEVOTHYROXINE SODIUM 50 MCG PO TABS: 50.0000 ug | ORAL_TABLET | Freq: Every day | ORAL | Status: DC

## 2014-11-17 NOTE — Progress Notes (Signed)
Pre visit review using our clinic review tool, if applicable. No additional management support is needed unless otherwise documented below in the visit note. 

## 2014-11-17 NOTE — Progress Notes (Signed)
[name], female   DOB: 1946/01/09, 69 y.o.   MRN: [id number]   Subjective:    [name], female    DOB: [birth year], 69 y.o.   MRN: [id number]  HPI  Patient here for a scheduled follow up.  Had recent colonoscopy.  Large polyp present.  Saw Dr Bary Castilla.  Planning colon surgery 11/26/14.  She is doing well.  Feels good.  Stays active.  Push mows her yard.  No cardiac symptoms with increased activity or exertion.  No sob.  No cough or congestion.  No diarrhea.  Bowels doing well.     Past Medical History  Diagnosis Date  . H/O syncope   . Goiter     s/p thyroidectomy  . Hypercholesterolemia   . Recurrent sinus infections     and allergy problems  . Hypothyroidism   . GERD (gastroesophageal reflux disease)   . Anemia     h/o 40 yrs ago    Outpatient Encounter Prescriptions as of 11/17/2014  Medication Sig  . levothyroxine (SYNTHROID, LEVOTHROID) 50 MCG tablet Take 1 tablet (50 mcg total) by mouth daily.  . [DISCONTINUED] levothyroxine (SYNTHROID, LEVOTHROID) 50 MCG tablet Take 1 tablet (50 mcg total) by mouth daily.  Marland Kitchen omeprazole (PRILOSEC) 40 MG capsule Take 40 mg by mouth every morning.    No facility-administered encounter medications on file as of 11/17/2014.    Review of Systems  Constitutional: Negative for appetite change and unexpected weight change.  HENT: Negative for congestion and sinus pressure.   Respiratory: Negative for cough, chest tightness and shortness of breath.   Cardiovascular: Negative for chest pain, palpitations and leg swelling.  Gastrointestinal: Negative for nausea, vomiting, abdominal pain and diarrhea.  Genitourinary: Negative for dysuria and difficulty urinating.  Musculoskeletal: Negative for back pain and joint swelling.  Skin: Negative for color change and rash.  Neurological: Negative for dizziness, light-headedness and headaches.  Psychiatric/Behavioral: Negative for dysphoric mood and agitation.       Objective:      Physical Exam  Constitutional: She appears well-developed and well-nourished. No distress.  HENT:  Nose: Nose normal.  Mouth/Throat: Oropharynx is clear and moist.  Neck: Neck supple. No thyromegaly present.  Cardiovascular: Normal rate and regular rhythm.   Pulmonary/Chest: Breath sounds normal. No respiratory distress. She has no wheezes.  Abdominal: Soft. Bowel sounds are normal. There is no tenderness.  Musculoskeletal: She exhibits no edema or tenderness.  Lymphadenopathy:    She has no cervical adenopathy.  Skin: No rash noted. No erythema.  Psychiatric: She has a normal mood and affect. Her behavior is normal.    BP 120/80 mmHg  Pulse 56  Temp(Src) 98.8 F (37.1 C) (Oral)  Ht 5\' 3"  (1.6 m)  Wt 131 lb 4 oz (59.535 kg)  BMI 23.26 kg/m2  SpO2 99% Wt Readings from Last 3 Encounters:  11/17/14 131 lb 4 oz (59.535 kg)  11/11/14 130 lb (58.968 kg)  11/03/14 128 lb (58.06 kg)     Lab Results  Component Value Date   WBC 5.1 11/17/2014   HGB 12.5 11/17/2014   HCT 37.0 11/17/2014   PLT 221.0 11/17/2014   GLUCOSE 92 11/17/2014   CHOL 214* 11/17/2014   TRIG 77.0 11/17/2014   HDL 76.40 11/17/2014   LDLCALC 123* 11/17/2014   ALT 12 11/17/2014   AST 18 11/17/2014   NA 141 11/17/2014   K 4.2 11/17/2014   CL 106 11/17/2014   CREATININE 1.02 11/17/2014   BUN  15 11/17/2014   CO2 28 11/17/2014   TSH 1.84 11/17/2014       Assessment & Plan:   Problem List Items Addressed This Visit    Anemia    Had recently colonoscopy.  Planning for colon surgery.  Check cbc.       Esophagitis    EGD 11/03/14 - Grade A esophagitis, gastritis, normal duodenum.  Upper symptoms controlled.  On omeprazole.        Essential hypertension, benign - Primary    Blood pressure under good control.  Continue same medication regimen.  Follow pressures.  Follow metabolic panel.        Relevant Orders   CBC with Differential/Platelet (Completed)   Basic metabolic panel (Completed)    Health care maintenance    Schedule for a physical.  Colonoscopy as outlined.  Planning for surgery.  Needs mammogram.  Discussed with her today.  She declines to schedule.  Wants to hold.        History of colonic polyps    Colonoscopy 11/03/14 - one large polyp in the cecum, one small polyp in the hepatic flexure and one polyp in the cecum.  Referred to surgery.        Hypothyroidism    On thyroid replacement.  Follow tsh.       Relevant Medications   levothyroxine (SYNTHROID, LEVOTHROID) 50 MCG tablet   Other Relevant Orders   TSH   Pre-op evaluation    No cardiac symptoms with increased activity or exertion.  No sob.  I feel she is at low risk from a cardiac standpoint to proceed with the planned surgery.  Will need close intra op and post op monitoring of heart rate and blood pressure to avoid extremes.         Other Visit Diagnoses    Hypercholesterolemia        Relevant Orders    Lipid panel (Completed)    Hepatic function panel (Completed)      I spent 25 minutes with the patient and more than 50% of the time was spent in consultation regarding the above.     Einar Pheasant, MD

## 2014-11-18 ENCOUNTER — Encounter: Payer: Self-pay | Admitting: *Deleted

## 2014-11-19 ENCOUNTER — Encounter
Admission: RE | Admit: 2014-11-19 | Discharge: 2014-11-19 | Disposition: A | Discharge status: Home / Self Care | Payer: Medicare Other | Source: Ambulatory Visit | Attending: General Surgery | Admitting: General Surgery

## 2014-11-19 DIAGNOSIS — Z01810 Encounter for preprocedural cardiovascular examination: Secondary | ICD-10-CM | POA: Diagnosis not present

## 2014-11-19 DIAGNOSIS — Z01818 Encounter for other preprocedural examination: Secondary | ICD-10-CM | POA: Diagnosis not present

## 2014-11-19 HISTORY — DX: Anemia, unspecified: D64.9

## 2014-11-19 HISTORY — DX: Hypothyroidism, unspecified: E03.9

## 2014-11-19 HISTORY — DX: Gastro-esophageal reflux disease without esophagitis: K21.9

## 2014-11-19 NOTE — Patient Instructions (Signed)
  Your procedure is scheduled on: 11-26-14 Report to [place] To find out your arrival time please call [phone] between 1PM - 3PM on 11-25-14 [facility])  Remember: Instructions that are not followed completely may result in serious medical risk, up to and including death, or upon the discretion of your surgeon and anesthesiologist your surgery may need to be rescheduled.    _X___ 1. Do not eat food or drink liquids after midnight. No gum chewing or hard candies.     _X___ 2. No Alcohol for 24 hours before or after surgery.   ____ 3. Bring all medications with you on the day of surgery if instructed.    _X___ 4. Notify your doctor if there is any change in your medical condition     (cold, fever, infections).     Do not wear jewelry, make-up, hairpins, clips or nail polish.  Do not wear lotions, powders, or perfumes. You may wear deodorant.  Do not shave 48 hours prior to surgery. Men may shave face and neck.  Do not bring valuables to the hospital.    [facility] is not responsible for any belongings or valuables.               Contacts, dentures or bridgework may not be worn into surgery.  Leave your suitcase in the car. After surgery it may be brought to your room.  For patients admitted to the hospital, discharge time is determined by your treatment team.   Patients discharged the day of surgery will not be allowed to drive home.   Please read over the following fact sheets that you were given:      _X___ Take these medicines the morning of surgery with A SIP OF WATER:    1. SYNTHROID  2. PRILOSEC  3. TAKE AN EXTRA PRILOSEC Thursday NIGHT  4.  5.  6.  ____ Fleet Enema (as directed)   _X___ Use CHG Soap as directed  ____ Use inhalers on the day of surgery  ____ Stop metformin 2 days prior to surgery    ____ Take 1/2 of usual insulin dose the night before surgery and none on the morning of surgery.   ____ Stop  Coumadin/Plavix/aspirin-N/A  ____ Stop Anti-inflammatories-NO NSAIDS OR ASPIRIN PRODUCTS-TYLENOL OK   ____ Stop supplements until after surgery.    ____ Bring C-Pap to the hospital.

## 2014-11-21 ENCOUNTER — Encounter: Payer: Self-pay | Admitting: Internal Medicine

## 2014-11-21 DIAGNOSIS — Z01818 Encounter for other preprocedural examination: Secondary | ICD-10-CM | POA: Insufficient documentation

## 2014-11-21 NOTE — Assessment & Plan Note (Signed)
Schedule for a physical.  Colonoscopy as outlined.  Planning for surgery.  Needs mammogram.  Discussed with her today.  She declines to schedule.  Wants to hold.

## 2014-11-21 NOTE — Assessment & Plan Note (Signed)
Had recently colonoscopy.  Planning for colon surgery.  Check cbc.

## 2014-11-21 NOTE — Assessment & Plan Note (Signed)
On thyroid replacement.  Follow tsh.  

## 2014-11-21 NOTE — Assessment & Plan Note (Signed)
EGD 11/03/14 - Grade A esophagitis, gastritis, normal duodenum.  Upper symptoms controlled.  On omeprazole.

## 2014-11-21 NOTE — Assessment & Plan Note (Signed)
Blood pressure under good control.  Continue same medication regimen.  Follow pressures.  Follow metabolic panel.   

## 2014-11-21 NOTE — Assessment & Plan Note (Signed)
No cardiac symptoms with increased activity or exertion.  No sob.  I feel she is at low risk from a cardiac standpoint to proceed with the planned surgery.  Will need close intra op and post op monitoring of heart rate and blood pressure to avoid extremes.

## 2014-11-21 NOTE — Assessment & Plan Note (Signed)
Colonoscopy 11/03/14 - one large polyp in the cecum, one small polyp in the hepatic flexure and one polyp in the cecum.  Referred to surgery.

## 2014-11-24 NOTE — Pre-Procedure Instructions (Signed)
EKG SENT OVER TO ANESTHESIA FOR REVIEW ON 11-19-14-STRESS AND ECHO FROM 2014 ON CHART-DR  THOMAS REVIEWED AND SAID PT OK TO PROCEED WITH SURGERY

## 2014-11-25 DIAGNOSIS — D649 Anemia, unspecified: Secondary | ICD-10-CM | POA: Diagnosis not present

## 2014-11-25 DIAGNOSIS — I10 Essential (primary) hypertension: Secondary | ICD-10-CM | POA: Diagnosis not present

## 2014-11-25 DIAGNOSIS — D120 Benign neoplasm of cecum: Secondary | ICD-10-CM | POA: Diagnosis not present

## 2014-11-25 DIAGNOSIS — E878 Other disorders of electrolyte and fluid balance, not elsewhere classified: Secondary | ICD-10-CM | POA: Diagnosis not present

## 2014-11-25 DIAGNOSIS — K219 Gastro-esophageal reflux disease without esophagitis: Secondary | ICD-10-CM | POA: Diagnosis not present

## 2014-11-25 DIAGNOSIS — E780 Pure hypercholesterolemia: Secondary | ICD-10-CM | POA: Diagnosis not present

## 2014-11-25 DIAGNOSIS — Z79899 Other long term (current) drug therapy: Secondary | ICD-10-CM | POA: Diagnosis not present

## 2014-11-25 DIAGNOSIS — E890 Postprocedural hypothyroidism: Secondary | ICD-10-CM | POA: Diagnosis not present

## 2014-11-26 ENCOUNTER — Encounter
Admission: RE | Disposition: A | Discharge status: Home / Self Care | Payer: Self-pay | Source: Ambulatory Visit | Attending: General Surgery

## 2014-11-26 ENCOUNTER — Ambulatory Visit: Payer: Medicare Other | Admitting: Anesthesiology

## 2014-11-26 ENCOUNTER — Ambulatory Visit
Admission: RE | Admit: 2014-11-26 | Discharge: 2014-11-26 | Disposition: A | Discharge status: Home / Self Care | Payer: Medicare Other | Source: Ambulatory Visit | Attending: General Surgery | Admitting: General Surgery

## 2014-11-26 DIAGNOSIS — K635 Polyp of colon: Secondary | ICD-10-CM | POA: Diagnosis not present

## 2014-11-26 DIAGNOSIS — K219 Gastro-esophageal reflux disease without esophagitis: Secondary | ICD-10-CM | POA: Diagnosis not present

## 2014-11-26 DIAGNOSIS — Z79899 Other long term (current) drug therapy: Secondary | ICD-10-CM | POA: Diagnosis not present

## 2014-11-26 DIAGNOSIS — D120 Benign neoplasm of cecum: Secondary | ICD-10-CM | POA: Diagnosis not present

## 2014-11-26 DIAGNOSIS — D649 Anemia, unspecified: Secondary | ICD-10-CM | POA: Diagnosis not present

## 2014-11-26 DIAGNOSIS — E878 Other disorders of electrolyte and fluid balance, not elsewhere classified: Secondary | ICD-10-CM | POA: Diagnosis not present

## 2014-11-26 DIAGNOSIS — Z86010 Personal history of colonic polyps: Secondary | ICD-10-CM

## 2014-11-26 DIAGNOSIS — I10 Essential (primary) hypertension: Secondary | ICD-10-CM | POA: Diagnosis not present

## 2014-11-26 DIAGNOSIS — E890 Postprocedural hypothyroidism: Secondary | ICD-10-CM | POA: Insufficient documentation

## 2014-11-26 DIAGNOSIS — E780 Pure hypercholesterolemia: Secondary | ICD-10-CM | POA: Insufficient documentation

## 2014-11-26 HISTORY — PX: LAPAROSCOPIC APPENDECTOMY: SHX408

## 2014-11-26 LAB — URINE DRUG SCREEN, QUALITATIVE (ARMC ONLY)
Amphetamines, Ur Screen: NOT DETECTED
Barbiturates, Ur Screen: NOT DETECTED
Benzodiazepine, Ur Scrn: NOT DETECTED
Cannabinoid 50 Ng, Ur ~~LOC~~: POSITIVE — AB
Cocaine Metabolite,Ur ~~LOC~~: NOT DETECTED
MDMA (Ecstasy)Ur Screen: NOT DETECTED
Methadone Scn, Ur: NOT DETECTED
Opiate, Ur Screen: NOT DETECTED
Phencyclidine (PCP) Ur S: NOT DETECTED
Tricyclic, Ur Screen: NOT DETECTED

## 2014-11-26 SURGERY — APPENDECTOMY, LAPAROSCOPIC
Anesthesia: General | Wound class: Clean Contaminated

## 2014-11-26 MED ORDER — LIDOCAINE HCL (CARDIAC) 20 MG/ML IV SOLN
INTRAVENOUS | Status: DC | PRN
  Administered 2014-11-26: 80 mg via INTRAVENOUS

## 2014-11-26 MED ORDER — GLYCOPYRROLATE 0.2 MG/ML IJ SOLN
INTRAMUSCULAR | Status: DC | PRN
  Administered 2014-11-26: .8 mg via INTRAVENOUS

## 2014-11-26 MED ORDER — PROMETHAZINE HCL 25 MG/ML IJ SOLN: 6.2500 mg | INTRAMUSCULAR | Status: DC | PRN

## 2014-11-26 MED ORDER — PROMETHAZINE HCL 25 MG/ML IJ SOLN: 12.5000 mg | INTRAMUSCULAR | Status: DC | PRN

## 2014-11-26 MED ORDER — LABETALOL HCL 5 MG/ML IV SOLN
INTRAVENOUS | Status: DC | PRN
  Administered 2014-11-26: 10 mg via INTRAVENOUS

## 2014-11-26 MED ORDER — ACETAMINOPHEN 10 MG/ML IV SOLN
INTRAVENOUS | Status: AC
  Filled 2014-11-26: qty 100

## 2014-11-26 MED ORDER — ONDANSETRON HCL 4 MG/2ML IJ SOLN: 4.0000 mg | Freq: Once | INTRAMUSCULAR | Status: DC | PRN

## 2014-11-26 MED ORDER — HYDROCODONE-ACETAMINOPHEN 5-325 MG PO TABS: 1.0000 | ORAL_TABLET | ORAL | Status: DC | PRN

## 2014-11-26 MED ORDER — HYDRALAZINE HCL 20 MG/ML IJ SOLN
INTRAMUSCULAR | Status: AC
  Administered 2014-11-26: 20 mg
  Filled 2014-11-26: qty 1

## 2014-11-26 MED ORDER — FENTANYL CITRATE (PF) 100 MCG/2ML IJ SOLN: 25.0000 ug | INTRAMUSCULAR | Status: DC | PRN

## 2014-11-26 MED ORDER — PROPOFOL 10 MG/ML IV BOLUS: INTRAVENOUS | Status: DC | PRN

## 2014-11-26 MED ORDER — LACTATED RINGERS IV SOLN
INTRAVENOUS | Status: DC
  Administered 2014-11-26 (×2): via INTRAVENOUS

## 2014-11-26 MED ORDER — SODIUM CHLORIDE 0.9 % IV SOLN
1.0000 g | INTRAVENOUS | Status: AC
  Administered 2014-11-26: 1 g via INTRAVENOUS
  Filled 2014-11-26: qty 1

## 2014-11-26 MED ORDER — MIDAZOLAM HCL 2 MG/2ML IJ SOLN
INTRAMUSCULAR | Status: DC | PRN
  Administered 2014-11-26: 2 mg via INTRAVENOUS

## 2014-11-26 MED ORDER — DEXAMETHASONE SODIUM PHOSPHATE 4 MG/ML IJ SOLN
INTRAMUSCULAR | Status: DC | PRN
  Administered 2014-11-26: 5 mg via INTRAVENOUS

## 2014-11-26 MED ORDER — ROCURONIUM BROMIDE 100 MG/10ML IV SOLN
INTRAVENOUS | Status: DC | PRN
  Administered 2014-11-26: 25 mg via INTRAVENOUS

## 2014-11-26 MED ORDER — ACETAMINOPHEN 10 MG/ML IV SOLN
INTRAVENOUS | Status: DC | PRN
  Administered 2014-11-26: 1000 mg via INTRAVENOUS

## 2014-11-26 MED ORDER — METOPROLOL TARTRATE 1 MG/ML IV SOLN
INTRAVENOUS | Status: DC | PRN
  Administered 2014-11-26: 2 mg via INTRAVENOUS

## 2014-11-26 MED ORDER — PROPOFOL 10 MG/ML IV BOLUS
INTRAVENOUS | Status: DC | PRN
  Administered 2014-11-26: 100 mg via INTRAVENOUS
  Administered 2014-11-26: 50 mg via INTRAVENOUS
  Administered 2014-11-26: 30 mg via INTRAVENOUS

## 2014-11-26 MED ORDER — LABETALOL HCL 5 MG/ML IV SOLN: 5.0000 mg | INTRAVENOUS | Status: DC | PRN

## 2014-11-26 MED ORDER — FENTANYL CITRATE (PF) 100 MCG/2ML IJ SOLN
INTRAMUSCULAR | Status: AC
  Filled 2014-11-26: qty 2

## 2014-11-26 MED ORDER — FENTANYL CITRATE (PF) 100 MCG/2ML IJ SOLN
25.0000 ug | INTRAMUSCULAR | Status: DC | PRN
  Administered 2014-11-26: 50 ug via INTRAVENOUS

## 2014-11-26 MED ORDER — NEOSTIGMINE METHYLSULFATE 10 MG/10ML IV SOLN
INTRAVENOUS | Status: DC | PRN
  Administered 2014-11-26: 4 mg via INTRAVENOUS

## 2014-11-26 MED ORDER — ONDANSETRON HCL 4 MG/2ML IJ SOLN
INTRAMUSCULAR | Status: DC | PRN
  Administered 2014-11-26: 4 mg via INTRAVENOUS

## 2014-11-26 MED ORDER — FENTANYL CITRATE (PF) 100 MCG/2ML IJ SOLN
INTRAMUSCULAR | Status: DC | PRN
  Administered 2014-11-26 (×2): 50 ug via INTRAVENOUS
  Administered 2014-11-26: 100 ug via INTRAVENOUS
  Administered 2014-11-26: 50 ug via INTRAVENOUS

## 2014-11-26 SURGICAL SUPPLY — 36 items
BENZOIN TINCTURE PRP APPL 2/3 (GAUZE/BANDAGES/DRESSINGS) ×2 IMPLANT
BLADE SURG 11 STRL SS SAFETY (MISCELLANEOUS) ×2 IMPLANT
CANISTER SUCT 1200ML W/VALVE (MISCELLANEOUS) ×2 IMPLANT
CANNULA DILATOR 12 W/SLV (CANNULA) ×2 IMPLANT
CATH TRAY 16F METER LATEX (MISCELLANEOUS) IMPLANT
CHLORAPREP W/TINT 26ML (MISCELLANEOUS) ×2 IMPLANT
CUTTER LINEAR ENDO 35 ART THIN (STAPLE) IMPLANT
DRESSING TELFA 4X3 1S ST N-ADH (GAUZE/BANDAGES/DRESSINGS) ×2 IMPLANT
DRSG TEGADERM 2-3/8X2-3/4 SM (GAUZE/BANDAGES/DRESSINGS) ×6 IMPLANT
GLOVE BIO SURGEON STRL SZ7.5 (GLOVE) ×6 IMPLANT
GLOVE INDICATOR 8.0 STRL GRN (GLOVE) ×6 IMPLANT
GOWN STRL REUS W/ TWL LRG LVL3 (GOWN DISPOSABLE) ×3 IMPLANT
GOWN STRL REUS W/TWL LRG LVL3 (GOWN DISPOSABLE) ×3
IRRIGATION STRYKERFLOW (MISCELLANEOUS) IMPLANT
IRRIGATOR STRYKERFLOW (MISCELLANEOUS)
IV LACTATED RINGERS 1000ML (IV SOLUTION) ×2 IMPLANT
KIT RM TURNOVER STRD PROC AR (KITS) ×2 IMPLANT
LABEL OR SOLS (LABEL) IMPLANT
NS IRRIG 1000ML POUR BTL (IV SOLUTION) IMPLANT
PACK LAP CHOLECYSTECTOMY (MISCELLANEOUS) ×2 IMPLANT
PAD GROUND ADULT SPLIT (MISCELLANEOUS) ×2 IMPLANT
POUCH ENDO CATCH 10MM SPEC (MISCELLANEOUS) ×2 IMPLANT
RELOAD /EVU35 (ENDOMECHANICALS) IMPLANT
RELOAD BLUE (STAPLE) ×2 IMPLANT
RELOAD CUTTER ETS 35MM STAND (ENDOMECHANICALS) IMPLANT
RELOAD STAPLER WHITE 60MM (STAPLE) ×1 IMPLANT
SCISSORS METZENBAUM CVD 33 (INSTRUMENTS) ×2 IMPLANT
SEAL FOR SCOPE WARMER C3101 (MISCELLANEOUS) IMPLANT
STAPLE ECHEON FLEX 60 POW ENDO (STAPLE) ×2 IMPLANT
STAPLER RELOAD WHITE 60MM (STAPLE) ×2
STRIP CLOSURE SKIN 1/2X4 (GAUZE/BANDAGES/DRESSINGS) ×2 IMPLANT
SUT VIC AB 0 CT2 27 (SUTURE) ×4 IMPLANT
SUT VIC AB 4-0 FS2 27 (SUTURE) ×2 IMPLANT
SWABSTK COMLB BENZOIN TINCTURE (MISCELLANEOUS) ×2 IMPLANT
TROCAR XCEL 12X100 BLDLESS (ENDOMECHANICALS) ×2 IMPLANT
TUBING INSUFFLATOR HI FLOW (MISCELLANEOUS) ×2 IMPLANT

## 2014-11-26 NOTE — H&P (Signed)
The patient was noted to have a large polyp at the base of the cecum or bring the appendiceal orifice. Plans are for laparoscopic resection of the cecum.  Patient's general health remains unchanged from her last office exam.  Clear PCP exam on August 3.  Procedure were reviewed with the patient and her husband.

## 2014-11-26 NOTE — Op Note (Signed)
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  Postoperative diagnosis: Same.  Operative procedure laparoscopic resection of the appendix and cecum.  Operative surgeon Ollen Bowl, M.D.  Anesthesia: Gen. endotracheal.  Estimated blood loss: 5 mL.  Clinical note: This 69 year old woman underwent a screening colonoscopy for heme positive stools. A tubular adenoma at the appendiceal orifice was identified and not felt to be a candidate for endoscopic resection.  The patient received Invanz prior to the procedure. SCD stockings for DVT prevention.  Operative note: With the patient under adequate general anesthesia the abdomen was prepped with chlor prep and drape. Palpation showed a small fascial defect at the umbilicus. A linear incision was made in through the trans-umbilical skin. A varies needle was placed to the fascial defect and the abdomen is flat with CO2 a 10 mmHg pressure after completing the hanging drop test. A 12 mm step port was expanded and inspection showed no evidence of injury from initial port placement. A 5 mm XL port was placed in the hypogastrium in direct vision and a 12 mm XL port placed in the lateral abdominal wall and the left side outside the rectus sheath. The cecum was freely mobile. The antimesenteric Hurricane of the terminal ileum was freed from its attachments to the mesial appendix with cautery dissection. The cecum was transected with a echelon's 60 mm stapler with the blue cartridge. Hemostasis was good. The mesial appendix was divided with a white vascular cartridge. An Endo Catch bag was used to retrieve the specimen. This was opened at the end of the procedure showing at least 1/2 cm margin between the polyp which engulfed the appendiceal orifice and the line of resection. Reinspection of the abdomen showed a small amount of blood in the right lower quadrant. This was irrigated clear. Few fine bleeding points along the bowel edge  were treated with cautery. The abdomen inspection was otherwise unremarkable. An atrophic right ovary was identified. No free fluid noted at the beginning of the procedure.  The lateral abdominal wall port site was closed with 80 by crural trans-fascial suture. The abdomen was desufflated and ports removed. The umbilical fascia was closed with a oh 5 chromic figure-of-eight suture. Skin incisions were closed with 4-0 Vicryls septic sutures. Benzoin, Steri-Strips, Telfa and Tegaderm dressings were applied.  The patient tolerated the procedure well and was brought to recovery in stable condition.

## 2014-11-26 NOTE — Anesthesia Procedure Notes (Signed)
Procedure Name: Intubation Date/Time: 11/26/2014 12:14 PM Performed by: Justus Memory Pre-anesthesia Checklist: Patient identified, Patient being monitored, Timeout performed, Emergency Drugs available and Suction available Patient Re-evaluated:Patient Re-evaluated prior to inductionOxygen Delivery Method: Circle system utilized Preoxygenation: Pre-oxygenation with 100% oxygen Intubation Type: IV induction and Cricoid Pressure applied Ventilation: Mask ventilation without difficulty Laryngoscope Size: Mac and 3 Grade View: Grade I Tube type: Oral Tube size: 7.0 mm Number of attempts: 1 Airway Equipment and Method: Stylet Placement Confirmation: ETT inserted through vocal cords under direct vision,  positive ETCO2 and breath sounds checked- equal and bilateral Secured at: 20 cm Tube secured with: Tape Dental Injury: Teeth and Oropharynx as per pre-operative assessment

## 2014-11-26 NOTE — Anesthesia Preprocedure Evaluation (Signed)
Anesthesia Evaluation  Patient identified by MRN, date of birth, ID band Patient awake    Reviewed: Allergy & Precautions, NPO status , Patient's Chart, lab work & pertinent test results  History of Anesthesia Complications Negative for: history of anesthetic complications  Airway Mallampati: II  TM Distance: >3 FB Neck ROM: Full    Dental  (+) Teeth Intact   Pulmonary Current Smoker (daily marjauana),          Cardiovascular hypertension (hx no meds now),     Neuro/Psych    GI/Hepatic GERD-  Medicated and Controlled,  Endo/Other  Hypothyroidism   Renal/GU      Musculoskeletal   Abdominal   Peds  Hematology  (+) anemia ,   Anesthesia Other Findings   Reproductive/Obstetrics                             Anesthesia Physical Anesthesia Plan  ASA: II  Anesthesia Plan: General   Post-op Pain Management:    Induction: Intravenous  Airway Management Planned: Oral ETT  Additional Equipment:   Intra-op Plan:   Post-operative Plan:   Informed Consent: I have reviewed the patients History and Physical, chart, labs and discussed the procedure including the risks, benefits and alternatives for the proposed anesthesia with the patient or authorized representative who has indicated his/her understanding and acceptance.     Plan Discussed with:   Anesthesia Plan Comments:         Anesthesia Quick Evaluation

## 2014-11-26 NOTE — Transfer of Care (Signed)
Immediate Anesthesia Transfer of Care Note  Patient: Jessica Wall  Procedure(s) Performed: Procedure(s): APPENDECTOMY LAPAROSCOPIC (N/A)  Patient Location: PACU  Anesthesia Type:General  Level of Consciousness: awake, alert  and oriented  Airway & Oxygen Therapy: Patient Spontanous Breathing and Patient connected to face mask oxygen  Post-op Assessment: Report given to RN and Post -op Vital signs reviewed and stable  Post vital signs: Reviewed and stable  Last Vitals:  Filed Vitals:   11/26/14 1105  BP: 193/89  Pulse: 49  Temp: 36.8 C  Resp: 18    Complications: No apparent anesthesia complications

## 2014-11-28 NOTE — Anesthesia Postprocedure Evaluation (Signed)
  Anesthesia Post-op Note  Patient: Jessica Wall  Procedure(s) Performed: Procedure(s): APPENDECTOMY LAPAROSCOPIC (N/A)  Anesthesia type:General  Patient location: PACU  Post pain: Pain level controlled  Post assessment: Post-op Vital signs reviewed, Patient's Cardiovascular Status Stable, Respiratory Function Stable, Patent Airway and No signs of Nausea or vomiting  Post vital signs: Reviewed and stable  Last Vitals:  Filed Vitals:   11/26/14 1539  BP: 156/84  Pulse:   Temp:   Resp: 16    Level of consciousness: awake, alert  and patient cooperative  Complications: No apparent anesthesia complications

## 2014-11-29 ENCOUNTER — Encounter: Payer: Self-pay | Admitting: General Surgery

## 2014-11-29 LAB — SURGICAL PATHOLOGY

## 2014-12-08 ENCOUNTER — Ambulatory Visit (INDEPENDENT_AMBULATORY_CARE_PROVIDER_SITE_OTHER): Payer: Medicare Other | Admitting: General Surgery

## 2014-12-08 ENCOUNTER — Encounter: Payer: Self-pay | Admitting: General Surgery

## 2014-12-08 VITALS — BP 128/70 | HR 64 | Resp 12 | Ht 64.0 in | Wt 128.0 lb

## 2014-12-08 DIAGNOSIS — Z86010 Personal history of colonic polyps: Secondary | ICD-10-CM

## 2014-12-08 NOTE — Progress Notes (Signed)
[name], female   DOB: 1945/07/12, 69 y.o.   MRN: [id number]  Chief Complaint  Patient presents with  . Routine Post Op    HPI Jessica Wall is a 69 y.o. female.  Here today for postoperative visit, laparoscopic appendectomy on 11-26-14. She states she is doing well only "sore". Bowels moving normal.    HPI  Past Medical History  Diagnosis Date  . H/O syncope   . Goiter     s/p thyroidectomy  . Hypercholesterolemia   . Recurrent sinus infections     and allergy problems  . Hypothyroidism   . GERD (gastroesophageal reflux disease)   . Anemia     h/o 40 yrs ago    Past Surgical History  Procedure Laterality Date  . Thyroidectomy  1979    goiter  . Tubal ligation  1975  . Dilation and curettage of uterus  [place]  . Colonoscopy with propofol N/A 11/03/2014    Procedure: COLONOSCOPY WITH PROPOFOL;  Surgeon: Manya Silvas, MD;  Location: [facility] ENDOSCOPY;  Service: Endoscopy;  Laterality: N/A;  . Esophagogastroduodenoscopy N/A 11/03/2014    Procedure: ESOPHAGOGASTRODUODENOSCOPY (EGD);  Surgeon: Manya Silvas, MD;  Location: [facility] ENDOSCOPY;  Service: Endoscopy;  Laterality: N/A;  . Laparoscopic appendectomy N/A 11/26/2014    Procedure: APPENDECTOMY LAPAROSCOPIC;  Surgeon: Robert Bellow, MD;  Location: [facility] ORS;  Service: General;  Laterality: N/A;    Family History  Problem Relation Age of Onset  . Tuberculosis Paternal Grandfather   . Alzheimer's disease Mother   . Goiter Mother     Social History Social History  Substance Use Topics  . Smoking status: Current Some Day Smoker    Types: Cigarettes  . Smokeless tobacco: Never Used  . Alcohol Use: 0.0 oz/week    0 Standard drinks or equivalent per week     Comment: occ    No Known Allergies  Current Outpatient Prescriptions  Medication Sig Dispense Refill  . HYDROcodone-acetaminophen (NORCO) 5-325 MG per tablet Take 1-2 tablets by mouth every 4 (four) hours as needed for moderate pain. 30  tablet 0  . levothyroxine (SYNTHROID, LEVOTHROID) 50 MCG tablet Take 1 tablet (50 mcg total) by mouth daily. 90 tablet 3  . omeprazole (PRILOSEC) 40 MG capsule Take 40 mg by mouth as needed.      No current facility-administered medications for this visit.    Review of Systems Review of Systems  Constitutional: Negative.   Respiratory: Negative.   Cardiovascular: Negative.     Blood pressure 128/70, pulse 64, resp. rate 12, height 5\' 4"  (1.626 m), weight 128 lb (58.06 kg).  Physical Exam Physical Exam  Constitutional: She is oriented to person, place, and time. She appears well-developed and well-nourished.  HENT:  Mouth/Throat: Oropharynx is clear and moist.  Eyes: Conjunctivae are normal. No scleral icterus.  Abdominal: Soft. There is no tenderness.  Port sites well healed.  Neurological: She is alert and oriented to person, place, and time.  Skin: Skin is warm and dry.  Psychiatric: Her behavior is normal.    Data Reviewed Surgical Pathology  CASE: [id number]  PATIENT: Jessica Wall  Surgical Pathology Report      SPECIMEN SUBMITTED:  A. Appendix and cecum   CLINICAL HISTORY:  Recent colonoscopy identified a tubulovillous adenoma at the appendiceal  orifice not felt to be a candidate for endoscopic resection.   PRE-OPERATIVE DIAGNOSIS:  Colon polyp   POST-OPERATIVE DIAGNOSIS:  Colon polyp  DIAGNOSIS:  A. 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  - NEGATIVE FOR HIGH-GRADE DYSPLASIA AND MALIGNANCY.  - MARGINS OF RESECTION ARE NEGATIVE FOR DYSPLASIA AND MALIGNANCY.  - UNREMARKABLE APPENDIX.   Assessment    Doing well status post laparoscopic resection of the cecum.    Plan    She will likely benefit from a follow-up colonoscopy in 3 years. This will be arranged by Dr. Percell [place] office.    Gradually increase activity. Proper lifting techniques reviewed. Follow up as  needed.  PCP:  Nash Shearer 12/08/2014, 10:47 AM

## 2014-12-08 NOTE — Patient Instructions (Addendum)
The patient is aware to call back for any questions or concerns.  Gradually increase activity. Proper lifting techniques reviewed

## 2015-02-17 ENCOUNTER — Encounter: Payer: Medicare Other | Admitting: Internal Medicine

## 2015-05-24 ENCOUNTER — Encounter: Payer: Self-pay | Admitting: Internal Medicine

## 2015-05-24 ENCOUNTER — Ambulatory Visit (INDEPENDENT_AMBULATORY_CARE_PROVIDER_SITE_OTHER): Payer: PPO | Admitting: Internal Medicine

## 2015-05-24 VITALS — BP 160/80 | HR 64 | Temp 98.7°F | Resp 12 | Ht 63.5 in | Wt 126.8 lb

## 2015-05-24 DIAGNOSIS — IMO0001 Reserved for inherently not codable concepts without codable children: Secondary | ICD-10-CM

## 2015-05-24 DIAGNOSIS — Z0000 Encounter for general adult medical examination without abnormal findings: Secondary | ICD-10-CM

## 2015-05-24 DIAGNOSIS — K209 Esophagitis, unspecified without bleeding: Secondary | ICD-10-CM

## 2015-05-24 DIAGNOSIS — Z860100 Personal history of colon polyps, unspecified: Secondary | ICD-10-CM

## 2015-05-24 DIAGNOSIS — E039 Hypothyroidism, unspecified: Secondary | ICD-10-CM

## 2015-05-24 DIAGNOSIS — Z1239 Encounter for other screening for malignant neoplasm of breast: Secondary | ICD-10-CM

## 2015-05-24 DIAGNOSIS — Z720 Tobacco use: Secondary | ICD-10-CM

## 2015-05-24 DIAGNOSIS — R55 Syncope and collapse: Secondary | ICD-10-CM

## 2015-05-24 DIAGNOSIS — D649 Anemia, unspecified: Secondary | ICD-10-CM

## 2015-05-24 DIAGNOSIS — I10 Essential (primary) hypertension: Secondary | ICD-10-CM | POA: Diagnosis not present

## 2015-05-24 DIAGNOSIS — F17200 Nicotine dependence, unspecified, uncomplicated: Secondary | ICD-10-CM

## 2015-05-24 NOTE — Progress Notes (Signed)
[name], female   DOB: 07/22/1945, 70 y.o.   MRN: [id number]   Subjective:    [name], female    DOB: 1945/08/01, 70 y.o.   MRN: [id number]  HPI  Patient with past history of hypercholesterolemia, GERD, anemia and hypothyroidism.  She comes in today to follow up on these issues as well as for a compete physical exam.  She states she is doing well.  Stays active.  No cardiac symptoms with increased activity or exertion.  No sob.  No acid reflux.  No abdominal pain or cramping.  Bowels stable.  Just had colon surgery with lap resection of cecum.  Recommended f/u colonoscopy in three years.     Past Medical History  Diagnosis Date  . H/O syncope   . Goiter     s/p thyroidectomy  . Hypercholesterolemia   . Recurrent sinus infections     and allergy problems  . Hypothyroidism   . GERD (gastroesophageal reflux disease)   . Anemia     h/o 40 yrs ago   Past Surgical History  Procedure Laterality Date  . Thyroidectomy  1979    goiter  . Tubal ligation  1975  . Dilation and curettage of uterus  [place]  . Colonoscopy with propofol N/A 11/03/2014    Procedure: COLONOSCOPY WITH PROPOFOL;  Surgeon: Manya Silvas, MD;  Location: [facility] ENDOSCOPY;  Service: Endoscopy;  Laterality: N/A;  . Esophagogastroduodenoscopy N/A 11/03/2014    Procedure: ESOPHAGOGASTRODUODENOSCOPY (EGD);  Surgeon: Manya Silvas, MD;  Location: [facility] ENDOSCOPY;  Service: Endoscopy;  Laterality: N/A;  . Laparoscopic appendectomy N/A 11/26/2014    Procedure: APPENDECTOMY LAPAROSCOPIC;  Surgeon: Robert Bellow, MD;  Location: [facility] ORS;  Service: General;  Laterality: N/A;   Family History  Problem Relation Age of Onset  . Tuberculosis Paternal Grandfather   . Alzheimer's disease Mother   . Goiter Mother    Social History   Social History  . Marital Status: Married    Spouse Name: N/A  . Number of Children: N/A  . Years of Education: N/A   Social History Main Topics  .  Smoking status: Current Some Day Smoker    Types: Cigarettes  . Smokeless tobacco: Never Used  . Alcohol Use: 0.0 oz/week    0 Standard drinks or equivalent per week     Comment: occ  . Drug Use: Yes    Special: Marijuana     Comment: marijuana every day  . Sexual Activity: Not Asked   Other Topics Concern  . None   Social History Narrative    Outpatient Encounter Prescriptions as of 05/24/2015  Medication Sig  . levothyroxine (SYNTHROID, LEVOTHROID) 50 MCG tablet Take 1 tablet (50 mcg total) by mouth daily.  Marland Kitchen HYDROcodone-acetaminophen (NORCO) 5-325 MG per tablet Take 1-2 tablets by mouth every 4 (four) hours as needed for moderate pain. (Patient not taking: Reported on 05/24/2015)  . omeprazole (PRILOSEC) 40 MG capsule Take 40 mg by mouth as needed. Reported on 05/24/2015   No facility-administered encounter medications on file as of 05/24/2015.    Review of Systems  Constitutional: Negative for appetite change and unexpected weight change.  HENT: Negative for congestion and sinus pressure.   Eyes: Negative for pain and visual disturbance.  Respiratory: Negative for cough, chest tightness and shortness of breath.   Cardiovascular: Negative for chest pain, palpitations and leg swelling.  Gastrointestinal: Negative for nausea, vomiting, abdominal pain and diarrhea.  Genitourinary: Negative for  dysuria and difficulty urinating.  Musculoskeletal: Negative for back pain and joint swelling.  Skin: Negative for color change and rash.  Neurological: Negative for dizziness, light-headedness and headaches.  Hematological: Negative for adenopathy. Does not bruise/bleed easily.  Psychiatric/Behavioral: Negative for dysphoric mood and agitation.       Objective:     Blood pressure rechecked by me:  148/82  Physical Exam  Constitutional: She is oriented to person, place, and time. She appears well-developed and well-nourished. No distress.  HENT:  Nose: Nose normal.  Mouth/Throat:  Oropharynx is clear and moist.  Eyes: Right eye exhibits no discharge. Left eye exhibits no discharge. No scleral icterus.  Neck: Neck supple. No thyromegaly present.  Cardiovascular: Normal rate and regular rhythm.   Pulmonary/Chest: Breath sounds normal. No accessory muscle usage. No tachypnea. No respiratory distress. She has no decreased breath sounds. She has no wheezes. She has no rhonchi. Right breast exhibits no inverted nipple, no mass, no nipple discharge and no tenderness (no axillary adenopathy). Left breast exhibits no inverted nipple, no mass, no nipple discharge and no tenderness (no axilarry adenopathy).  Abdominal: Soft. Bowel sounds are normal. There is no tenderness.  Musculoskeletal: She exhibits no edema or tenderness.  Lymphadenopathy:    She has no cervical adenopathy.  Neurological: She is alert and oriented to person, place, and time.  Skin: Skin is warm. No rash noted. No erythema.  Psychiatric: She has a normal mood and affect. Her behavior is normal.    BP 160/80 mmHg  Pulse 64  Temp(Src) 98.7 F (37.1 C) (Oral)  Resp 12  Ht 5' 3.5" (1.613 m)  Wt 126 lb 12 oz (57.493 kg)  BMI 22.10 kg/m2  SpO2 95% Wt Readings from Last 3 Encounters:  05/24/15 126 lb 12 oz (57.493 kg)  12/08/14 128 lb (58.06 kg)  11/26/14 131 lb (59.421 kg)     Lab Results  Component Value Date   WBC 5.1 11/17/2014   HGB 12.5 11/17/2014   HCT 37.0 11/17/2014   PLT 221.0 11/17/2014   GLUCOSE 92 11/17/2014   CHOL 214* 11/17/2014   TRIG 77.0 11/17/2014   HDL 76.40 11/17/2014   LDLCALC 123* 11/17/2014   ALT 12 11/17/2014   AST 18 11/17/2014   NA 141 11/17/2014   K 4.2 11/17/2014   CL 106 11/17/2014   CREATININE 1.02 11/17/2014   BUN 15 11/17/2014   CO2 28 11/17/2014   TSH 1.84 11/17/2014       Assessment & Plan:   Problem List Items Addressed This Visit    Anemia    Follow cbc.        Relevant Orders   CBC with Differential/Platelet   Esophagitis    EGD 11/03/14 -  Grade esophagitis, gastritis, normal duodenum.  Upper symptoms controlled on omeprazole.  Follow.        Essential hypertension, benign    Blood pressure elevated.  Have her spot check her pressure.  Get her back in soon to reassess.  Follow pressures.  Follow metabolic panel.        Relevant Orders   Lipid panel   Hepatic function panel   Basic metabolic panel   Health care maintenance    Physical today 05/24/15.  Colonoscopy as outlined.  Recommended f/u colonoscopy in three years.  Needs a mammogram.  Has refused previously.  Agrees today.        History of colonic polyps    Colonoscopy 11/03/14 - one large polyp in the cecum,  one small polyp in the hepatic flexure.  tubulovillus adenoma.  S/p lap resection of cecum.  Recommended f/u in three years.        Hypothyroidism    On thyroid replacement.  Follow tsh.        Relevant Orders   TSH   Smoking    Smokes marijuana.  Discussed the need to stop.  Follow.        Syncope    No further episodes.  Follow.         Other Visit Diagnoses    Breast cancer screening    -  Primary    Relevant Orders    MM DIGITAL SCREENING BILATERAL        Einar Pheasant, MD

## 2015-05-24 NOTE — Progress Notes (Signed)
Pre-visit discussion using our clinic review tool. No additional management support is needed unless otherwise documented below in the visit note.  

## 2015-05-30 ENCOUNTER — Encounter: Payer: Self-pay | Admitting: Internal Medicine

## 2015-05-30 NOTE — Assessment & Plan Note (Signed)
Physical today 05/24/15.  Colonoscopy as outlined.  Recommended f/u colonoscopy in three years.  Needs a mammogram.  Has refused previously.  Agrees today.

## 2015-05-30 NOTE — Assessment & Plan Note (Signed)
On thyroid replacement.  Follow tsh.  

## 2015-05-30 NOTE — Assessment & Plan Note (Signed)
Colonoscopy 11/03/14 - one large polyp in the cecum, one small polyp in the hepatic flexure.  tubulovillus adenoma.  S/p lap resection of cecum.  Recommended f/u in three years.

## 2015-05-30 NOTE — Assessment & Plan Note (Signed)
EGD 11/03/14 - Grade esophagitis, gastritis, normal duodenum.  Upper symptoms controlled on omeprazole.  Follow.

## 2015-05-30 NOTE — Assessment & Plan Note (Signed)
Blood pressure elevated.  Have her spot check her pressure.  Get her back in soon to reassess.  Follow pressures.  Follow metabolic panel.   

## 2015-05-30 NOTE — Assessment & Plan Note (Signed)
Follow cbc.  

## 2015-05-30 NOTE — Assessment & Plan Note (Signed)
No further episodes.  Follow.

## 2015-05-30 NOTE — Assessment & Plan Note (Signed)
Smokes marijuana.  Discussed the need to stop.  Follow.   

## 2015-06-02 ENCOUNTER — Ambulatory Visit
Admission: RE | Admit: 2015-06-02 | Discharge: 2015-06-02 | Disposition: A | Discharge status: Home / Self Care | Payer: PPO | Source: Ambulatory Visit | Attending: Internal Medicine | Admitting: Internal Medicine

## 2015-06-02 ENCOUNTER — Other Ambulatory Visit: Payer: Self-pay | Admitting: Internal Medicine

## 2015-06-02 DIAGNOSIS — Z1231 Encounter for screening mammogram for malignant neoplasm of breast: Secondary | ICD-10-CM

## 2015-06-02 DIAGNOSIS — Z1239 Encounter for other screening for malignant neoplasm of breast: Secondary | ICD-10-CM

## 2015-06-23 ENCOUNTER — Other Ambulatory Visit (INDEPENDENT_AMBULATORY_CARE_PROVIDER_SITE_OTHER): Payer: PPO

## 2015-06-23 ENCOUNTER — Other Ambulatory Visit: Payer: Self-pay | Admitting: *Deleted

## 2015-06-23 DIAGNOSIS — E039 Hypothyroidism, unspecified: Secondary | ICD-10-CM | POA: Diagnosis not present

## 2015-06-23 DIAGNOSIS — D649 Anemia, unspecified: Secondary | ICD-10-CM

## 2015-06-23 DIAGNOSIS — I10 Essential (primary) hypertension: Secondary | ICD-10-CM

## 2015-06-23 LAB — CBC WITH DIFFERENTIAL/PLATELET
Basophils Absolute: 0 10*3/uL (ref 0.0–0.1)
Basophils Relative: 0.3 % (ref 0.0–3.0)
Eosinophils Absolute: 0.1 10*3/uL (ref 0.0–0.7)
Eosinophils Relative: 2.2 % (ref 0.0–5.0)
HCT: 35.5 % — ABNORMAL LOW (ref 36.0–46.0)
Hemoglobin: 12 g/dL (ref 12.0–15.0)
Lymphocytes Relative: 47.2 % — ABNORMAL HIGH (ref 12.0–46.0)
Lymphs Abs: 2.3 10*3/uL (ref 0.7–4.0)
MCHC: 33.7 g/dL (ref 30.0–36.0)
MCV: 97.7 fl (ref 78.0–100.0)
Monocytes Absolute: 0.3 10*3/uL (ref 0.1–1.0)
Monocytes Relative: 6.2 % (ref 3.0–12.0)
Neutro Abs: 2.2 10*3/uL (ref 1.4–7.7)
Neutrophils Relative %: 44.1 % (ref 43.0–77.0)
Platelets: 279 10*3/uL (ref 150.0–400.0)
RBC: 3.64 Mil/uL — ABNORMAL LOW (ref 3.87–5.11)
RDW: 14.8 % (ref 11.5–15.5)
WBC: 4.9 10*3/uL (ref 4.0–10.5)

## 2015-06-23 LAB — LIPID PANEL
Cholesterol: 186 mg/dL (ref 0–200)
HDL: 66.3 mg/dL (ref >39.00)
LDL Cholesterol: 103 mg/dL — ABNORMAL HIGH (ref 0–99)
NonHDL: 120.09
Total CHOL/HDL Ratio: 3
Triglycerides: 83 mg/dL (ref 0.0–149.0)
VLDL: 16.6 mg/dL (ref 0.0–40.0)

## 2015-06-23 LAB — HEPATIC FUNCTION PANEL
ALT: 12 U/L (ref 0–35)
AST: 18 U/L (ref 0–37)
Albumin: 3.9 g/dL (ref 3.5–5.2)
Alkaline Phosphatase: 59 U/L (ref 39–117)
Bilirubin, Direct: 0.1 mg/dL (ref 0.0–0.3)
Total Bilirubin: 0.4 mg/dL (ref 0.2–1.2)
Total Protein: 6.6 g/dL (ref 6.0–8.3)

## 2015-06-23 LAB — BASIC METABOLIC PANEL
BUN: 13 mg/dL (ref 6–23)
CO2: 29 mEq/L (ref 19–32)
Calcium: 9 mg/dL (ref 8.4–10.5)
Chloride: 106 mEq/L (ref 96–112)
Creatinine, Ser: 1.02 mg/dL (ref 0.40–1.20)
GFR: 69.31 mL/min (ref >60.00)
Glucose, Bld: 100 mg/dL — ABNORMAL HIGH (ref 70–99)
Potassium: 4.2 mEq/L (ref 3.5–5.1)
Sodium: 140 mEq/L (ref 135–145)

## 2015-06-23 LAB — TSH: TSH: 1.44 u[IU]/mL (ref 0.35–4.50)

## 2015-06-24 ENCOUNTER — Encounter: Payer: Self-pay | Admitting: *Deleted

## 2015-08-03 ENCOUNTER — Ambulatory Visit (INDEPENDENT_AMBULATORY_CARE_PROVIDER_SITE_OTHER): Payer: PPO | Admitting: Internal Medicine

## 2015-08-03 ENCOUNTER — Encounter: Payer: Self-pay | Admitting: Internal Medicine

## 2015-08-03 VITALS — BP 142/100 | HR 53 | Temp 98.0°F | Resp 18 | Ht 63.5 in | Wt 123.8 lb

## 2015-08-03 DIAGNOSIS — Z86010 Personal history of colonic polyps: Secondary | ICD-10-CM

## 2015-08-03 DIAGNOSIS — D649 Anemia, unspecified: Secondary | ICD-10-CM

## 2015-08-03 DIAGNOSIS — E039 Hypothyroidism, unspecified: Secondary | ICD-10-CM

## 2015-08-03 DIAGNOSIS — I10 Essential (primary) hypertension: Secondary | ICD-10-CM

## 2015-08-03 MED ORDER — LISINOPRIL 10 MG PO TABS
10.0000 mg | ORAL_TABLET | Freq: Every day | ORAL | Status: DC
Start: 2015-08-03 — End: 2015-09-29

## 2015-08-03 NOTE — Progress Notes (Signed)
Patient ID: Hylda Hugo, female   DOB: [birth year], 70 y.o.   MRN: [id number]   Subjective:    Patient ID: Zorka Savarese, female    DOB: 1945-11-25, 70 y.o.   MRN: [id number]  HPI  Patient here for a scheduled follow up.  She feels good.  Stays active.  No cardiac symptoms with increased activity or exertion.  No sob.  No acid reflux.  No abdominal pain or cramping.  Bowels stable.  Has significantly decreased her alcohol intake.     Past Medical History  Diagnosis Date  . H/O syncope   . Goiter     s/p thyroidectomy  . Hypercholesterolemia   . Recurrent sinus infections     and allergy problems  . Hypothyroidism   . GERD (gastroesophageal reflux disease)   . Anemia     h/o 40 yrs ago   Past Surgical History  Procedure Laterality Date  . Thyroidectomy  1979    goiter  . Tubal ligation  1975  . Dilation and curettage of uterus  [place]  . Colonoscopy with propofol N/A 11/03/2014    Procedure: COLONOSCOPY WITH PROPOFOL;  Surgeon: Manya Silvas, MD;  Location: [facility] ENDOSCOPY;  Service: Endoscopy;  Laterality: N/A;  . Esophagogastroduodenoscopy N/A 11/03/2014    Procedure: ESOPHAGOGASTRODUODENOSCOPY (EGD);  Surgeon: Manya Silvas, MD;  Location: [facility] ENDOSCOPY;  Service: Endoscopy;  Laterality: N/A;  . Laparoscopic appendectomy N/A 11/26/2014    Procedure: APPENDECTOMY LAPAROSCOPIC;  Surgeon: Robert Bellow, MD;  Location: [facility] ORS;  Service: General;  Laterality: N/A;   Family History  Problem Relation Age of Onset  . Tuberculosis Paternal Grandfather   . Alzheimer's disease Mother   . Goiter Mother    Social History   Social History  . Marital Status: Married    Spouse Name: N/A  . Number of Children: N/A  . Years of Education: N/A   Social History Main Topics  . Smoking status: Current Some Day Smoker    Types: Cigarettes  . Smokeless tobacco: Never Used  . Alcohol Use: 0.0 oz/week    0 Standard drinks or equivalent per week     Comment: occ  . Drug Use:  Yes    Special: Marijuana     Comment: marijuana every day  . Sexual Activity: Not Asked   Other Topics Concern  . None   Social History Narrative    Outpatient Encounter Prescriptions as of 08/03/2015  Medication Sig  . levothyroxine (SYNTHROID, LEVOTHROID) 50 MCG tablet Take 1 tablet (50 mcg total) by mouth daily.  Marland Kitchen lisinopril (PRINIVIL,ZESTRIL) 10 MG tablet Take 1 tablet (10 mg total) by mouth daily.   No facility-administered encounter medications on file as of 08/03/2015.    Review of Systems  Constitutional: Negative for appetite change and unexpected weight change.  HENT: Negative for congestion and sinus pressure.   Respiratory: Negative for cough, chest tightness and shortness of breath.   Cardiovascular: Negative for chest pain, palpitations and leg swelling.  Gastrointestinal: Negative for nausea, vomiting, abdominal pain and diarrhea.  Genitourinary: Negative for dysuria and difficulty urinating.  Musculoskeletal: Negative for back pain and joint swelling.  Skin: Negative for color change and rash.  Neurological: Negative for dizziness, light-headedness and headaches.  Psychiatric/Behavioral: Negative for dysphoric mood and agitation.       Objective:     Blood pressure rechecked by me:  148-150/94-96  Physical Exam  Constitutional: She appears well-developed and well-nourished. No distress.  HENT:  Nose: Nose  normal.  Mouth/Throat: Oropharynx is clear and moist.  Neck: Neck supple. No thyromegaly present.  Cardiovascular: Normal rate and regular rhythm.   Pulmonary/Chest: Breath sounds normal. No respiratory distress. She has no wheezes.  Abdominal: Soft. Bowel sounds are normal. There is no tenderness.  Musculoskeletal: She exhibits no edema or tenderness.  Lymphadenopathy:    She has no cervical adenopathy.  Skin: No rash noted. No erythema.  Psychiatric: She has a normal mood and affect. Her behavior is normal.    BP 142/100 mmHg  Pulse 53   Temp(Src) 98 F (36.7 C) (Oral)  Resp 18  Ht 5' 3.5" (1.613 m)  Wt 123 lb 12 oz (56.133 kg)  BMI 21.57 kg/m2  SpO2 98% Wt Readings from Last 3 Encounters:  08/03/15 123 lb 12 oz (56.133 kg)  05/24/15 126 lb 12 oz (57.493 kg)  12/08/14 128 lb (58.06 kg)     Lab Results  Component Value Date   WBC 4.9 06/23/2015   HGB 12.0 06/23/2015   HCT 35.5* 06/23/2015   PLT 279.0 06/23/2015   GLUCOSE 100* 06/23/2015   CHOL 186 06/23/2015   TRIG 83.0 06/23/2015   HDL 66.30 06/23/2015   LDLCALC 103* 06/23/2015   ALT 12 06/23/2015   AST 18 06/23/2015   NA 140 06/23/2015   K 4.2 06/23/2015   CL 106 06/23/2015   CREATININE 1.02 06/23/2015   BUN 13 06/23/2015   CO2 29 06/23/2015   TSH 1.44 06/23/2015    Mm Screening Breast Tomo Bilateral  06/10/2015  CLINICAL DATA:  Screening. EXAM: DIGITAL SCREENING BILATERAL MAMMOGRAM WITH 3D TOMO WITH CAD COMPARISON:  Previous exam(s). ACR Breast Density Category c: The breast tissue is heterogeneously dense, which may obscure small masses. FINDINGS: There are no findings suspicious for malignancy. Images were processed with CAD. IMPRESSION: No mammographic evidence of malignancy. A result letter of this screening mammogram will be mailed directly to the patient. RECOMMENDATION: Screening mammogram in one year. (Code:SM-B-01Y) BI-RADS CATEGORY  1: Negative. Electronically Signed   By: Margarette Canada M.D.   On: 06/10/2015 09:07       Assessment & Plan:   Problem List Items Addressed This Visit    Anemia    Follow cbc.  Recheck with next labs.       Essential hypertension, benign - Primary    Blood pressure elevated.  Start lisinopril 10mg  q day.  Check metabolic panel in 2 weeks.  Follow pressures.  Follow metabolic panel.         Relevant Medications   lisinopril (PRINIVIL,ZESTRIL) 10 MG tablet   Other Relevant Orders   TSH   Comprehensive metabolic panel   CBC with Differential/Platelet   History of colonic polyps    Colonoscopy 11/03/14 as  outlined.        Hypothyroidism    On thyroid medication.  Follow tsh.       Relevant Orders   Lipid panel       Einar Pheasant, MD

## 2015-08-03 NOTE — Progress Notes (Signed)
Pre-visit discussion using our clinic review tool. No additional management support is needed unless otherwise documented below in the visit note.  

## 2015-08-14 ENCOUNTER — Encounter: Payer: Self-pay | Admitting: Internal Medicine

## 2015-08-14 NOTE — Assessment & Plan Note (Addendum)
Blood pressure elevated.  Start lisinopril 10mg  q day.  Check metabolic panel in 2 weeks.  Follow pressures.  Follow metabolic panel.

## 2015-08-14 NOTE — Assessment & Plan Note (Signed)
Follow cbc. Recheck with next labs.  

## 2015-08-14 NOTE — Assessment & Plan Note (Signed)
On thyroid medication.  Follow tsh.  

## 2015-08-14 NOTE — Assessment & Plan Note (Signed)
Colonoscopy - 10/2014 as outlined.   

## 2015-08-17 ENCOUNTER — Other Ambulatory Visit (INDEPENDENT_AMBULATORY_CARE_PROVIDER_SITE_OTHER): Payer: PPO

## 2015-08-17 DIAGNOSIS — E039 Hypothyroidism, unspecified: Secondary | ICD-10-CM | POA: Diagnosis not present

## 2015-08-17 DIAGNOSIS — I10 Essential (primary) hypertension: Secondary | ICD-10-CM | POA: Diagnosis not present

## 2015-08-17 LAB — COMPREHENSIVE METABOLIC PANEL
ALT: 18 U/L (ref 0–35)
AST: 23 U/L (ref 0–37)
Albumin: 4 g/dL (ref 3.5–5.2)
Alkaline Phosphatase: 64 U/L (ref 39–117)
BUN: 15 mg/dL (ref 6–23)
CO2: 28 mEq/L (ref 19–32)
Calcium: 8.8 mg/dL (ref 8.4–10.5)
Chloride: 106 mEq/L (ref 96–112)
Creatinine, Ser: 1.04 mg/dL (ref 0.40–1.20)
GFR: 67.74 mL/min (ref >60.00)
Glucose, Bld: 104 mg/dL — ABNORMAL HIGH (ref 70–99)
Potassium: 4 mEq/L (ref 3.5–5.1)
Sodium: 140 mEq/L (ref 135–145)
Total Bilirubin: 0.4 mg/dL (ref 0.2–1.2)
Total Protein: 6.8 g/dL (ref 6.0–8.3)

## 2015-08-17 LAB — LIPID PANEL
Cholesterol: 222 mg/dL — ABNORMAL HIGH (ref 0–200)
HDL: 79.5 mg/dL (ref >39.00)
LDL Cholesterol: 129 mg/dL — ABNORMAL HIGH (ref 0–99)
NonHDL: 142.1
Total CHOL/HDL Ratio: 3
Triglycerides: 65 mg/dL (ref 0.0–149.0)
VLDL: 13 mg/dL (ref 0.0–40.0)

## 2015-08-17 LAB — CBC WITH DIFFERENTIAL/PLATELET
Basophils Absolute: 0 10*3/uL (ref 0.0–0.1)
Basophils Relative: 0.6 % (ref 0.0–3.0)
Eosinophils Absolute: 0.1 10*3/uL (ref 0.0–0.7)
Eosinophils Relative: 2 % (ref 0.0–5.0)
HCT: 35.3 % — ABNORMAL LOW (ref 36.0–46.0)
Hemoglobin: 11.8 g/dL — ABNORMAL LOW (ref 12.0–15.0)
Lymphocytes Relative: 48.9 % — ABNORMAL HIGH (ref 12.0–46.0)
Lymphs Abs: 2.7 10*3/uL (ref 0.7–4.0)
MCHC: 33.5 g/dL (ref 30.0–36.0)
MCV: 99.2 fl (ref 78.0–100.0)
Monocytes Absolute: 0.3 10*3/uL (ref 0.1–1.0)
Monocytes Relative: 5 % (ref 3.0–12.0)
Neutro Abs: 2.4 10*3/uL (ref 1.4–7.7)
Neutrophils Relative %: 43.5 % (ref 43.0–77.0)
Platelets: 219 10*3/uL (ref 150.0–400.0)
RBC: 3.56 Mil/uL — ABNORMAL LOW (ref 3.87–5.11)
RDW: 15.1 % (ref 11.5–15.5)
WBC: 5.6 10*3/uL (ref 4.0–10.5)

## 2015-08-17 LAB — TSH: TSH: 2.47 u[IU]/mL (ref 0.35–4.50)

## 2015-08-20 ENCOUNTER — Other Ambulatory Visit: Payer: Self-pay | Admitting: Internal Medicine

## 2015-08-20 DIAGNOSIS — D649 Anemia, unspecified: Secondary | ICD-10-CM

## 2015-08-20 NOTE — Progress Notes (Signed)
Order placed for f/u labs.  

## 2015-08-23 ENCOUNTER — Telehealth: Payer: Self-pay | Admitting: *Deleted

## 2015-08-23 NOTE — Telephone Encounter (Signed)
Voicemail; patient missed a call yesterday in reference to her labs from 08/17/15. She requested a return call.

## 2015-08-23 NOTE — Telephone Encounter (Signed)
Returned patients call back for lab results

## 2015-09-08 ENCOUNTER — Other Ambulatory Visit (INDEPENDENT_AMBULATORY_CARE_PROVIDER_SITE_OTHER): Payer: PPO

## 2015-09-08 DIAGNOSIS — D649 Anemia, unspecified: Secondary | ICD-10-CM

## 2015-09-08 LAB — IBC PANEL
Iron: 62 ug/dL (ref 42–145)
Saturation Ratios: 19 % — ABNORMAL LOW (ref 20.0–50.0)
Transferrin: 233 mg/dL (ref 212.0–360.0)

## 2015-09-08 LAB — CBC WITH DIFFERENTIAL/PLATELET
Basophils Absolute: 0 10*3/uL (ref 0.0–0.1)
Basophils Relative: 0.6 % (ref 0.0–3.0)
Eosinophils Absolute: 0.1 10*3/uL (ref 0.0–0.7)
Eosinophils Relative: 2.6 % (ref 0.0–5.0)
HCT: 36.4 % (ref 36.0–46.0)
Hemoglobin: 12.3 g/dL (ref 12.0–15.0)
Lymphocytes Relative: 43.4 % (ref 12.0–46.0)
Lymphs Abs: 2.5 10*3/uL (ref 0.7–4.0)
MCHC: 33.7 g/dL (ref 30.0–36.0)
MCV: 97.4 fl (ref 78.0–100.0)
Monocytes Absolute: 0.3 10*3/uL (ref 0.1–1.0)
Monocytes Relative: 4.6 % (ref 3.0–12.0)
Neutro Abs: 2.8 10*3/uL (ref 1.4–7.7)
Neutrophils Relative %: 48.8 % (ref 43.0–77.0)
Platelets: 203 10*3/uL (ref 150.0–400.0)
RBC: 3.74 Mil/uL — ABNORMAL LOW (ref 3.87–5.11)
RDW: 15.1 % (ref 11.5–15.5)
WBC: 5.7 10*3/uL (ref 4.0–10.5)

## 2015-09-08 LAB — VITAMIN B12: Vitamin B-12: 273 pg/mL (ref 211–911)

## 2015-09-08 LAB — FERRITIN: Ferritin: 30.1 ng/mL (ref 10.0–291.0)

## 2015-09-13 ENCOUNTER — Telehealth: Payer: Self-pay | Admitting: *Deleted

## 2015-09-13 NOTE — Telephone Encounter (Signed)
Patient requested lab results from 05/25 [id number]

## 2015-09-14 NOTE — Telephone Encounter (Signed)
Patient is aware of of lab results.

## 2015-09-14 NOTE — Telephone Encounter (Signed)
Please advise for lab results, thanks 

## 2015-09-28 ENCOUNTER — Ambulatory Visit: Payer: PPO | Admitting: Internal Medicine

## 2015-09-29 ENCOUNTER — Encounter: Payer: Self-pay | Admitting: Internal Medicine

## 2015-09-29 ENCOUNTER — Ambulatory Visit (INDEPENDENT_AMBULATORY_CARE_PROVIDER_SITE_OTHER): Payer: PPO | Admitting: Internal Medicine

## 2015-09-29 VITALS — BP 120/72 | HR 69 | Temp 98.2°F | Resp 18 | Ht 63.5 in | Wt 127.0 lb

## 2015-09-29 DIAGNOSIS — I10 Essential (primary) hypertension: Secondary | ICD-10-CM | POA: Diagnosis not present

## 2015-09-29 DIAGNOSIS — E039 Hypothyroidism, unspecified: Secondary | ICD-10-CM | POA: Diagnosis not present

## 2015-09-29 MED ORDER — LISINOPRIL 10 MG PO TABS: 10.0000 mg | ORAL_TABLET | Freq: Every day | ORAL | Status: DC

## 2015-09-29 NOTE — Progress Notes (Signed)
[name], female   DOB: 01/22/1946, 70 y.o.   MRN: [id number]   Subjective:    [name], female    DOB: 02-07-1946, 70 y.o.   MRN: [id number]  HPI  Patient here for a scheduled follow up.  States she is doing well.  Feels good.  Stays active.  No cardiac symptoms with increased activity or exertion.  No sob.  No acid reflux.  No abdominal pain or cramping.  Bowels stable.  On blood pressure medication.  Tolerating.     Past Medical History  Diagnosis Date  . H/O syncope   . Goiter     s/p thyroidectomy  . Hypercholesterolemia   . Recurrent sinus infections     and allergy problems  . Hypothyroidism   . GERD (gastroesophageal reflux disease)   . Anemia     h/o 40 yrs ago   Past Surgical History  Procedure Laterality Date  . Thyroidectomy  1979    goiter  . Tubal ligation  1975  . Dilation and curettage of uterus  [place]  . Colonoscopy with propofol N/A 11/03/2014    Procedure: COLONOSCOPY WITH PROPOFOL;  Surgeon: Manya Silvas, MD;  Location: [facility] ENDOSCOPY;  Service: Endoscopy;  Laterality: N/A;  . Esophagogastroduodenoscopy N/A 11/03/2014    Procedure: ESOPHAGOGASTRODUODENOSCOPY (EGD);  Surgeon: Manya Silvas, MD;  Location: [facility] ENDOSCOPY;  Service: Endoscopy;  Laterality: N/A;  . Laparoscopic appendectomy N/A 11/26/2014    Procedure: APPENDECTOMY LAPAROSCOPIC;  Surgeon: Robert Bellow, MD;  Location: [facility] ORS;  Service: General;  Laterality: N/A;   Family History  Problem Relation Age of Onset  . Tuberculosis Paternal Grandfather   . Alzheimer's disease Mother   . Goiter Mother    Social History   Social History  . Marital Status: Married    Spouse Name: N/A  . Number of Children: N/A  . Years of Education: N/A   Social History Main Topics  . Smoking status: Current Some Day Smoker    Types: Cigarettes  . Smokeless tobacco: Never Used  . Alcohol Use: 0.0 oz/week    0 Standard drinks or equivalent per week     Comment:  occ  . Drug Use: Yes    Special: Marijuana     Comment: marijuana every day  . Sexual Activity: Not Asked   Other Topics Concern  . None   Social History Narrative    Outpatient Encounter Prescriptions as of 09/29/2015  Medication Sig  . levothyroxine (SYNTHROID, LEVOTHROID) 50 MCG tablet Take 1 tablet (50 mcg total) by mouth daily.  Marland Kitchen lisinopril (PRINIVIL,ZESTRIL) 10 MG tablet Take 1 tablet (10 mg total) by mouth daily.  . [DISCONTINUED] lisinopril (PRINIVIL,ZESTRIL) 10 MG tablet Take 1 tablet (10 mg total) by mouth daily.   No facility-administered encounter medications on file as of 09/29/2015.    Review of Systems  Constitutional: Negative for appetite change and unexpected weight change.  HENT: Negative for congestion and sinus pressure.   Respiratory: Negative for cough, chest tightness and shortness of breath.   Cardiovascular: Negative for chest pain, palpitations and leg swelling.  Gastrointestinal: Negative for nausea, vomiting, abdominal pain and diarrhea.  Genitourinary: Negative for dysuria and difficulty urinating.  Musculoskeletal: Negative for back pain and joint swelling.  Skin: Negative for color change and rash.  Neurological: Negative for dizziness, light-headedness and headaches.  Psychiatric/Behavioral: Negative for dysphoric mood and agitation.       Objective:    Physical Exam  Constitutional: She appears well-developed and well-nourished. No distress.  HENT:  Nose: Nose normal.  Mouth/Throat: Oropharynx is clear and moist.  Neck: Neck supple. No thyromegaly present.  Cardiovascular: Normal rate and regular rhythm.   Pulmonary/Chest: Breath sounds normal. No respiratory distress. She has no wheezes.  Abdominal: Soft. Bowel sounds are normal. There is no tenderness.  Musculoskeletal: She exhibits no edema or tenderness.  Lymphadenopathy:    She has no cervical adenopathy.  Skin: No rash noted. No erythema.  Psychiatric: She has a normal mood and  affect. Her behavior is normal.    BP 120/72 mmHg  Pulse 69  Temp(Src) 98.2 F (36.8 C) (Oral)  Resp 18  Ht 5' 3.5" (1.613 m)  Wt 127 lb (57.607 kg)  BMI 22.14 kg/m2  SpO2 98% Wt Readings from Last 3 Encounters:  09/29/15 127 lb (57.607 kg)  08/03/15 123 lb 12 oz (56.133 kg)  05/24/15 126 lb 12 oz (57.493 kg)     Lab Results  Component Value Date   WBC 5.7 09/08/2015   HGB 12.3 09/08/2015   HCT 36.4 09/08/2015   PLT 203.0 09/08/2015   GLUCOSE 104* 08/17/2015   CHOL 222* 08/17/2015   TRIG 65.0 08/17/2015   HDL 79.50 08/17/2015   LDLCALC 129* 08/17/2015   ALT 18 08/17/2015   AST 23 08/17/2015   NA 140 08/17/2015   K 4.0 08/17/2015   CL 106 08/17/2015   CREATININE 1.04 08/17/2015   BUN 15 08/17/2015   CO2 28 08/17/2015   TSH 2.47 08/17/2015    Mm Screening Breast Tomo Bilateral  06/10/2015  CLINICAL DATA:  Screening. EXAM: DIGITAL SCREENING BILATERAL MAMMOGRAM WITH 3D TOMO WITH CAD COMPARISON:  Previous exam(s). ACR Breast Density Category c: The breast tissue is heterogeneously dense, which may obscure small masses. FINDINGS: There are no findings suspicious for malignancy. Images were processed with CAD. IMPRESSION: No mammographic evidence of malignancy. A result letter of this screening mammogram will be mailed directly to the patient. RECOMMENDATION: Screening mammogram in one year. (Code:SM-B-01Y) BI-RADS CATEGORY  1: Negative. Electronically Signed   By: Margarette Canada M.D.   On: 06/10/2015 09:07       Assessment & Plan:   Problem List Items Addressed This Visit    Essential hypertension, benign - Primary    Blood pressure doing better.  Continue lisinopril.  Follow met b.       Relevant Medications   lisinopril (PRINIVIL,ZESTRIL) 10 MG tablet   Hypothyroidism    On thyroid replacement.  Follow tsh.            Einar Pheasant, MD

## 2015-09-29 NOTE — Progress Notes (Signed)
Pre-visit discussion using our clinic review tool. No additional management support is needed unless otherwise documented below in the visit note.  

## 2015-10-03 ENCOUNTER — Encounter: Payer: Self-pay | Admitting: Internal Medicine

## 2015-10-03 NOTE — Assessment & Plan Note (Signed)
Blood pressure doing better.  Continue lisinopril.  Follow met b.

## 2015-10-03 NOTE — Assessment & Plan Note (Signed)
On thyroid replacement.  Follow tsh.  

## 2015-12-07 ENCOUNTER — Other Ambulatory Visit: Payer: Self-pay | Admitting: Internal Medicine

## 2016-02-01 ENCOUNTER — Ambulatory Visit (INDEPENDENT_AMBULATORY_CARE_PROVIDER_SITE_OTHER): Payer: PPO | Admitting: Internal Medicine

## 2016-02-01 ENCOUNTER — Encounter: Payer: Self-pay | Admitting: Internal Medicine

## 2016-02-01 DIAGNOSIS — E039 Hypothyroidism, unspecified: Secondary | ICD-10-CM | POA: Diagnosis not present

## 2016-02-01 DIAGNOSIS — F17200 Nicotine dependence, unspecified, uncomplicated: Secondary | ICD-10-CM | POA: Diagnosis not present

## 2016-02-01 DIAGNOSIS — D649 Anemia, unspecified: Secondary | ICD-10-CM | POA: Diagnosis not present

## 2016-02-01 DIAGNOSIS — I10 Essential (primary) hypertension: Secondary | ICD-10-CM

## 2016-02-01 MED ORDER — LISINOPRIL 10 MG PO TABS: 10.0000 mg | ORAL_TABLET | Freq: Every day | ORAL | 1 refills | Status: DC

## 2016-02-01 NOTE — Progress Notes (Signed)
[name], female   DOB: 1945-06-05, 70 y.o.   MRN: [id number]   Subjective:    [name], female    DOB: 1946/04/08, 70 y.o.   MRN: [id number]  HPI  Patient here for a scheduled follow up.  She feels she is doing well.  Stays active.  No chest pain.  Eating.  States has a good appetite.  No sob.  No acid reflux.  No abdominal pain or cramping.  Bowels stable.  Not taking her blood pressure medication.  Blood pressure elevated today.  She is also not taking her B12.  States she has cut down significantly on her alcohol intake.  Almost stopped.    Past Medical History:  Diagnosis Date  . Anemia    h/o 40 yrs ago  . GERD (gastroesophageal reflux disease)   . Goiter    s/p thyroidectomy  . H/O syncope   . Hypercholesterolemia   . Hypothyroidism   . Recurrent sinus infections    and allergy problems   Past Surgical History:  Procedure Laterality Date  . COLONOSCOPY WITH PROPOFOL N/A 11/03/2014   Procedure: COLONOSCOPY WITH PROPOFOL;  Surgeon: Manya Silvas, MD;  Location: [facility] ENDOSCOPY;  Service: Endoscopy;  Laterality: N/A;  . [place]  . ESOPHAGOGASTRODUODENOSCOPY N/A 11/03/2014   Procedure: ESOPHAGOGASTRODUODENOSCOPY (EGD);  Surgeon: Manya Silvas, MD;  Location: [facility] ENDOSCOPY;  Service: Endoscopy;  Laterality: N/A;  . LAPAROSCOPIC APPENDECTOMY N/A 11/26/2014   Procedure: APPENDECTOMY LAPAROSCOPIC;  Surgeon: Robert Bellow, MD;  Location: [facility] ORS;  Service: General;  Laterality: N/A;  . thyroidectomy  1979   goiter  . TUBAL LIGATION  1975   Family History  Problem Relation Age of Onset  . Tuberculosis Paternal Grandfather   . Alzheimer's disease Mother   . Goiter Mother    Social History   Social History  . Marital status: Married    Spouse name: N/A  . Number of children: N/A  . Years of education: N/A   Social History Main Topics  . Smoking status: Current Some Day Smoker    Types:  Cigarettes  . Smokeless tobacco: Never Used  . Alcohol use 0.0 oz/week     Comment: occ  . Drug use:     Types: Marijuana     Comment: marijuana every day  . Sexual activity: Not Asked   Other Topics Concern  . None   Social History Narrative  . None    Outpatient Encounter Prescriptions as of 02/01/2016  Medication Sig  . levothyroxine (SYNTHROID, LEVOTHROID) 50 MCG tablet Take 1 tablet (50 mcg total) by mouth daily.  Marland Kitchen lisinopril (PRINIVIL,ZESTRIL) 10 MG tablet Take 1 tablet (10 mg total) by mouth daily. Hold until pt is ready for refill.  . [DISCONTINUED] lisinopril (PRINIVIL,ZESTRIL) 10 MG tablet Take 1 tablet (10 mg total) by mouth daily.   No facility-administered encounter medications on file as of 02/01/2016.     Review of Systems  Constitutional: Negative for appetite change and unexpected weight change.  HENT: Negative for congestion and sinus pressure.   Respiratory: Negative for cough, chest tightness and shortness of breath.   Cardiovascular: Negative for chest pain, palpitations and leg swelling.  Gastrointestinal: Negative for abdominal pain, diarrhea, nausea and vomiting.  Genitourinary: Negative for difficulty urinating and dysuria.  Musculoskeletal: Negative for back pain and joint swelling.  Skin: Negative for color change and rash.  Neurological: Negative for dizziness, light-headedness and headaches.  Psychiatric/Behavioral: Negative for agitation and dysphoric mood.       Objective:    Physical Exam  Constitutional: She appears well-developed and well-nourished. No distress.  HENT:  Nose: Nose normal.  Mouth/Throat: Oropharynx is clear and moist.  Neck: Neck supple. No thyromegaly present.  Cardiovascular: Normal rate and regular rhythm.   Pulmonary/Chest: Breath sounds normal. No respiratory distress. She has no wheezes.  Abdominal: Soft. Bowel sounds are normal. There is no tenderness.  Musculoskeletal: She exhibits no edema or tenderness.    Lymphadenopathy:    She has no cervical adenopathy.  Skin: No rash noted. No erythema.  Psychiatric: She has a normal mood and affect. Her behavior is normal.    BP (!) 150/80   Pulse 62   Temp 98.8 F (37.1 C) (Oral)   Ht 5\' 4"  (1.626 m)   Wt 124 lb 6.4 oz (56.4 kg)   SpO2 97%   BMI 21.35 kg/m  Wt Readings from Last 3 Encounters:  02/01/16 124 lb 6.4 oz (56.4 kg)  09/29/15 127 lb (57.6 kg)  08/03/15 123 lb 12 oz (56.1 kg)     Lab Results  Component Value Date   WBC 5.7 09/08/2015   HGB 12.3 09/08/2015   HCT 36.4 09/08/2015   PLT 203.0 09/08/2015   GLUCOSE 104 (H) 08/17/2015   CHOL 222 (H) 08/17/2015   TRIG 65.0 08/17/2015   HDL 79.50 08/17/2015   LDLCALC 129 (H) 08/17/2015   ALT 18 08/17/2015   AST 23 08/17/2015   NA 140 08/17/2015   K 4.0 08/17/2015   CL 106 08/17/2015   CREATININE 1.04 08/17/2015   BUN 15 08/17/2015   CO2 28 08/17/2015   TSH 2.47 08/17/2015    Mm Screening Breast Tomo Bilateral  Result Date: 06/10/2015 CLINICAL DATA:  Screening. EXAM: DIGITAL SCREENING BILATERAL MAMMOGRAM WITH 3D TOMO WITH CAD COMPARISON:  Previous exam(s). ACR Breast Density Category c: The breast tissue is heterogeneously dense, which may obscure small masses. FINDINGS: There are no findings suspicious for malignancy. Images were processed with CAD. IMPRESSION: No mammographic evidence of malignancy. A result letter of this screening mammogram will be mailed directly to the patient. RECOMMENDATION: Screening mammogram in one year. (Code:SM-B-01Y) BI-RADS CATEGORY  1: Negative. Electronically Signed   By: Margarette Canada M.D.   On: 06/10/2015 09:07       Assessment & Plan:   Problem List Items Addressed This Visit    Anemia    Follow cbc. B12 low end of normal on recent check.  Discussed the need to start oral b12.        Essential hypertension, benign    Blood pressure remains elevated.  She is not taking her blood pressure medication.  Restart lisinopril.  Check metabolic  panel in 2 weeks.  Follow.  Schedule f/u soon to reassess.       Relevant Medications   lisinopril (PRINIVIL,ZESTRIL) 10 MG tablet   Other Relevant Orders   Basic metabolic panel   Hepatic function panel   Lipid panel   Hypothyroidism    On thyroid replacement.  Follow tsh.       Relevant Orders   TSH   Smoking    Smokes marijuana occasionally.  Have discussed the need to stop.        Other Visit Diagnoses   None.      Einar Pheasant, MD

## 2016-02-01 NOTE — Progress Notes (Signed)
Pre visit review using our clinic review tool, if applicable. No additional management support is needed unless otherwise documented below in the visit note. 

## 2016-02-01 NOTE — Patient Instructions (Signed)
Vitamin B12 1000mcg per day °

## 2016-02-02 ENCOUNTER — Encounter: Payer: Self-pay | Admitting: Internal Medicine

## 2016-02-02 NOTE — Assessment & Plan Note (Addendum)
Blood pressure remains elevated.  She is not taking her blood pressure medication.  Restart lisinopril.  Check metabolic panel in 2 weeks.  Follow.  Schedule f/u soon to reassess.

## 2016-02-02 NOTE — Assessment & Plan Note (Signed)
Smokes marijuana occasionally.  Have discussed the need to stop.

## 2016-02-02 NOTE — Assessment & Plan Note (Signed)
On thyroid replacement.  Follow tsh.  

## 2016-02-02 NOTE — Assessment & Plan Note (Signed)
Follow cbc. B12 low end of normal on recent check.  Discussed the need to start oral b12.

## 2016-02-16 ENCOUNTER — Other Ambulatory Visit (INDEPENDENT_AMBULATORY_CARE_PROVIDER_SITE_OTHER): Payer: PPO

## 2016-02-16 DIAGNOSIS — E039 Hypothyroidism, unspecified: Secondary | ICD-10-CM

## 2016-02-16 DIAGNOSIS — I10 Essential (primary) hypertension: Secondary | ICD-10-CM

## 2016-02-16 LAB — HEPATIC FUNCTION PANEL
ALT: 15 U/L (ref 0–35)
AST: 22 U/L (ref 0–37)
Albumin: 4 g/dL (ref 3.5–5.2)
Alkaline Phosphatase: 67 U/L (ref 39–117)
Bilirubin, Direct: 0.1 mg/dL (ref 0.0–0.3)
Total Bilirubin: 0.4 mg/dL (ref 0.2–1.2)
Total Protein: 7 g/dL (ref 6.0–8.3)

## 2016-02-16 LAB — LIPID PANEL
Cholesterol: 222 mg/dL — ABNORMAL HIGH (ref 0–200)
HDL: 87.9 mg/dL (ref >39.00)
LDL Cholesterol: 122 mg/dL — ABNORMAL HIGH (ref 0–99)
NonHDL: 134.23
Total CHOL/HDL Ratio: 3
Triglycerides: 59 mg/dL (ref 0.0–149.0)
VLDL: 11.8 mg/dL (ref 0.0–40.0)

## 2016-02-16 LAB — BASIC METABOLIC PANEL
BUN: 16 mg/dL (ref 6–23)
CO2: 29 mEq/L (ref 19–32)
Calcium: 9.2 mg/dL (ref 8.4–10.5)
Chloride: 107 mEq/L (ref 96–112)
Creatinine, Ser: 1.03 mg/dL (ref 0.40–1.20)
GFR: 68.4 mL/min (ref >60.00)
Glucose, Bld: 92 mg/dL (ref 70–99)
Potassium: 4.2 mEq/L (ref 3.5–5.1)
Sodium: 141 mEq/L (ref 135–145)

## 2016-02-16 LAB — TSH: TSH: 3.7 u[IU]/mL (ref 0.35–4.50)

## 2016-02-17 ENCOUNTER — Telehealth: Payer: Self-pay | Admitting: Internal Medicine

## 2016-02-17 NOTE — Telephone Encounter (Signed)
Pt called returning your call regarding results. Pt will be out for 45 minutes. Thank you!  Call pt @ [phone]

## 2016-02-22 NOTE — Telephone Encounter (Signed)
Patient notified

## 2016-02-22 NOTE — Telephone Encounter (Signed)
Pt requested lab results  Pt contact [phone]

## 2016-03-20 ENCOUNTER — Encounter: Payer: Self-pay | Admitting: Internal Medicine

## 2016-03-20 ENCOUNTER — Ambulatory Visit (INDEPENDENT_AMBULATORY_CARE_PROVIDER_SITE_OTHER): Payer: PPO | Admitting: Internal Medicine

## 2016-03-20 DIAGNOSIS — I10 Essential (primary) hypertension: Secondary | ICD-10-CM

## 2016-03-20 DIAGNOSIS — E039 Hypothyroidism, unspecified: Secondary | ICD-10-CM

## 2016-03-20 NOTE — Progress Notes (Signed)
Pre visit review using our clinic review tool, if applicable. No additional management support is needed unless otherwise documented below in the visit note. 

## 2016-03-20 NOTE — Progress Notes (Signed)
[name], female   DOB: 06-19-1945, 70 y.o.   MRN: [id number]   Subjective:    [name], female    DOB: 07-22-1945, 70 y.o.   MRN: [id number]  HPI  Patient here for a scheduled follow up.  She reports she is doing well.  Feels good.  Stays active.  No chest pain.  No sob.  No acid reflux.  No abdominal pain or cramping.  Bowels stable.  Taking her blood pressure medication.  Tolerating.     Past Medical History:  Diagnosis Date  . Anemia    h/o 40 yrs ago  . GERD (gastroesophageal reflux disease)   . Goiter    s/p thyroidectomy  . H/O syncope   . Hypercholesterolemia   . Hypothyroidism   . Recurrent sinus infections    and allergy problems   Past Surgical History:  Procedure Laterality Date  . COLONOSCOPY WITH PROPOFOL N/A 11/03/2014   Procedure: COLONOSCOPY WITH PROPOFOL;  Surgeon: Manya Silvas, MD;  Location: [facility] ENDOSCOPY;  Service: Endoscopy;  Laterality: N/A;  . [place]  . ESOPHAGOGASTRODUODENOSCOPY N/A 11/03/2014   Procedure: ESOPHAGOGASTRODUODENOSCOPY (EGD);  Surgeon: Manya Silvas, MD;  Location: [facility] ENDOSCOPY;  Service: Endoscopy;  Laterality: N/A;  . LAPAROSCOPIC APPENDECTOMY N/A 11/26/2014   Procedure: APPENDECTOMY LAPAROSCOPIC;  Surgeon: Robert Bellow, MD;  Location: [facility] ORS;  Service: General;  Laterality: N/A;  . thyroidectomy  1979   goiter  . TUBAL LIGATION  1975   Family History  Problem Relation Age of Onset  . Tuberculosis Paternal Grandfather   . Alzheimer's disease Mother   . Goiter Mother    Social History   Social History  . Marital status: Married    Spouse name: N/A  . Number of children: N/A  . Years of education: N/A   Social History Main Topics  . Smoking status: Current Some Day Smoker    Types: Cigarettes  . Smokeless tobacco: Never Used  . Alcohol use 0.0 oz/week     Comment: occ  . Drug use:     Types: Marijuana     Comment: marijuana every day  .  Sexual activity: Not Asked   Other Topics Concern  . None   Social History Narrative  . None    Outpatient Encounter Prescriptions as of 03/20/2016  Medication Sig  . levothyroxine (SYNTHROID, LEVOTHROID) 50 MCG tablet Take 1 tablet (50 mcg total) by mouth daily.  Marland Kitchen lisinopril (PRINIVIL,ZESTRIL) 10 MG tablet Take 1 tablet (10 mg total) by mouth daily. Hold until pt is ready for refill.   No facility-administered encounter medications on file as of 03/20/2016.     Review of Systems  Constitutional: Negative for appetite change and unexpected weight change.  HENT: Negative for congestion and sinus pressure.   Respiratory: Negative for cough, chest tightness and shortness of breath.   Cardiovascular: Negative for chest pain, palpitations and leg swelling.  Gastrointestinal: Negative for abdominal pain, diarrhea and nausea.  Genitourinary: Negative for difficulty urinating and dysuria.  Musculoskeletal: Negative for back pain and joint swelling.  Skin: Negative for color change and rash.  Neurological: Negative for dizziness, light-headedness and headaches.  Psychiatric/Behavioral: Negative for agitation and dysphoric mood.       Objective:     Blood pressure rechecked by me:  138/84  Physical Exam  Constitutional: She appears well-developed and well-nourished. No distress.  HENT:  Nose: Nose normal.  Mouth/Throat: Oropharynx is  clear and moist.  Neck: Neck supple. No thyromegaly present.  Cardiovascular: Normal rate and regular rhythm.   Pulmonary/Chest: Breath sounds normal. No respiratory distress. She has no wheezes.  Abdominal: Soft. Bowel sounds are normal. There is no tenderness.  Musculoskeletal: She exhibits no edema or tenderness.  Lymphadenopathy:    She has no cervical adenopathy.  Skin: No rash noted. No erythema.  Psychiatric: She has a normal mood and affect. Her behavior is normal.    BP (!) 142/82   Pulse (!) 59   Temp 98.9 F (37.2 C) (Oral)   Ht 5'  4" (1.626 m)   Wt 128 lb (58.1 kg)   SpO2 98%   BMI 21.97 kg/m  Wt Readings from Last 3 Encounters:  03/20/16 128 lb (58.1 kg)  02/01/16 124 lb 6.4 oz (56.4 kg)  09/29/15 127 lb (57.6 kg)     Lab Results  Component Value Date   WBC 5.7 09/08/2015   HGB 12.3 09/08/2015   HCT 36.4 09/08/2015   PLT 203.0 09/08/2015   GLUCOSE 92 02/16/2016   CHOL 222 (H) 02/16/2016   TRIG 59.0 02/16/2016   HDL 87.90 02/16/2016   LDLCALC 122 (H) 02/16/2016   ALT 15 02/16/2016   AST 22 02/16/2016   NA 141 02/16/2016   K 4.2 02/16/2016   CL 107 02/16/2016   CREATININE 1.03 02/16/2016   BUN 16 02/16/2016   CO2 29 02/16/2016   TSH 3.70 02/16/2016    Mm Screening Breast Tomo Bilateral  Result Date: 06/10/2015 CLINICAL DATA:  Screening. EXAM: DIGITAL SCREENING BILATERAL MAMMOGRAM WITH 3D TOMO WITH CAD COMPARISON:  Previous exam(s). ACR Breast Density Category c: The breast tissue is heterogeneously dense, which may obscure small masses. FINDINGS: There are no findings suspicious for malignancy. Images were processed with CAD. IMPRESSION: No mammographic evidence of malignancy. A result letter of this screening mammogram will be mailed directly to the patient. RECOMMENDATION: Screening mammogram in one year. (Code:SM-B-01Y) BI-RADS CATEGORY  1: Negative. Electronically Signed   By: Margarette Canada M.D.   On: 06/10/2015 09:07       Assessment & Plan:   Problem List Items Addressed This Visit    Essential hypertension, benign    Blood pressure under better control.  Continue same medication regimen.  Follow pressures.  Follow metabolic panel.        Hypothyroidism    On thyroid replacement.  Follow tsh.            Jessica Pheasant, MD

## 2016-03-25 ENCOUNTER — Encounter: Payer: Self-pay | Admitting: Internal Medicine

## 2016-03-25 NOTE — Assessment & Plan Note (Addendum)
Blood pressure under better control.  Continue same medication regimen.  Follow pressures.  Follow metabolic panel.    

## 2016-03-25 NOTE — Assessment & Plan Note (Signed)
On thyroid replacement.  Follow tsh.  

## 2016-06-20 ENCOUNTER — Telehealth: Payer: Self-pay | Admitting: Internal Medicine

## 2016-06-20 NOTE — Telephone Encounter (Signed)
Pt will call back to schedule AWV. Did not want to schedule at this time.

## 2016-06-25 ENCOUNTER — Ambulatory Visit (INDEPENDENT_AMBULATORY_CARE_PROVIDER_SITE_OTHER): Payer: PPO | Admitting: Internal Medicine

## 2016-06-25 ENCOUNTER — Encounter: Payer: Self-pay | Admitting: Internal Medicine

## 2016-06-25 ENCOUNTER — Other Ambulatory Visit (HOSPITAL_COMMUNITY)
Admission: RE | Admit: 2016-06-25 | Discharge: 2016-06-25 | Disposition: A | Discharge status: Home / Self Care | Payer: PPO | Source: Ambulatory Visit | Attending: Internal Medicine | Admitting: Internal Medicine

## 2016-06-25 VITALS — BP 152/88 | HR 54 | Temp 98.6°F | Resp 16 | Ht 64.0 in | Wt 129.0 lb

## 2016-06-25 DIAGNOSIS — D649 Anemia, unspecified: Secondary | ICD-10-CM | POA: Diagnosis not present

## 2016-06-25 DIAGNOSIS — Z86010 Personal history of colonic polyps: Secondary | ICD-10-CM

## 2016-06-25 DIAGNOSIS — Z01419 Encounter for gynecological examination (general) (routine) without abnormal findings: Secondary | ICD-10-CM | POA: Insufficient documentation

## 2016-06-25 DIAGNOSIS — M545 Low back pain, unspecified: Secondary | ICD-10-CM

## 2016-06-25 DIAGNOSIS — Z0000 Encounter for general adult medical examination without abnormal findings: Secondary | ICD-10-CM

## 2016-06-25 DIAGNOSIS — E039 Hypothyroidism, unspecified: Secondary | ICD-10-CM

## 2016-06-25 DIAGNOSIS — I10 Essential (primary) hypertension: Secondary | ICD-10-CM

## 2016-06-25 DIAGNOSIS — Z124 Encounter for screening for malignant neoplasm of cervix: Secondary | ICD-10-CM | POA: Diagnosis not present

## 2016-06-25 DIAGNOSIS — Z1151 Encounter for screening for human papillomavirus (HPV): Secondary | ICD-10-CM | POA: Diagnosis not present

## 2016-06-25 LAB — BASIC METABOLIC PANEL
BUN: 15 mg/dL (ref 7–25)
CO2: 28 mmol/L (ref 20–31)
Calcium: 9.1 mg/dL (ref 8.6–10.4)
Chloride: 106 mmol/L (ref 98–110)
Creat: 1.06 mg/dL — ABNORMAL HIGH (ref 0.50–0.99)
Glucose, Bld: 90 mg/dL (ref 65–99)
Potassium: 4.6 mmol/L (ref 3.5–5.3)
Sodium: 141 mmol/L (ref 135–146)

## 2016-06-25 LAB — TSH: TSH: 5.13 mIU/L — ABNORMAL HIGH

## 2016-06-25 LAB — CBC WITH DIFFERENTIAL/PLATELET
Basophils Absolute: 0 cells/uL (ref 0–200)
Basophils Relative: 0 %
Eosinophils Absolute: 126 cells/uL (ref 15–500)
Eosinophils Relative: 2 %
HCT: 40.4 % (ref 35.0–45.0)
Hemoglobin: 13.2 g/dL (ref 11.7–15.5)
Lymphocytes Relative: 49 %
Lymphs Abs: 3087 cells/uL (ref 850–3900)
MCH: 31.3 pg (ref 27.0–33.0)
MCHC: 32.7 g/dL (ref 32.0–36.0)
MCV: 95.7 fL (ref 80.0–100.0)
MPV: 10.7 fL (ref 7.5–12.5)
Monocytes Absolute: 252 cells/uL (ref 200–950)
Monocytes Relative: 4 %
Neutro Abs: 2835 cells/uL (ref 1500–7800)
Neutrophils Relative %: 45 %
Platelets: 229 10*3/uL (ref 140–400)
RBC: 4.22 MIL/uL (ref 3.80–5.10)
RDW: 14.7 % (ref 11.0–15.0)
WBC: 6.3 10*3/uL (ref 3.8–10.8)

## 2016-06-25 LAB — HEPATIC FUNCTION PANEL
ALT: 14 U/L (ref 6–29)
AST: 21 U/L (ref 10–35)
Albumin: 4.2 g/dL (ref 3.6–5.1)
Alkaline Phosphatase: 68 U/L (ref 33–130)
Bilirubin, Direct: 0.1 mg/dL (ref <=0.2)
Indirect Bilirubin: 0.3 mg/dL (ref 0.2–1.2)
Total Bilirubin: 0.4 mg/dL (ref 0.2–1.2)
Total Protein: 7.1 g/dL (ref 6.1–8.1)

## 2016-06-25 LAB — LIPID PANEL
Cholesterol: 233 mg/dL — ABNORMAL HIGH (ref <200)
HDL: 89 mg/dL (ref >50)
LDL Cholesterol: 126 mg/dL — ABNORMAL HIGH (ref <100)
Total CHOL/HDL Ratio: 2.6 Ratio (ref <5.0)
Triglycerides: 92 mg/dL (ref <150)
VLDL: 18 mg/dL (ref <30)

## 2016-06-25 MED ORDER — LISINOPRIL 20 MG PO TABS: 20.0000 mg | ORAL_TABLET | Freq: Every day | ORAL | 2 refills | Status: DC

## 2016-06-25 NOTE — Patient Instructions (Signed)
Increase lisinopril to 20mg per day 

## 2016-06-25 NOTE — Progress Notes (Signed)
[name], female   DOB: May 25, 1945, 71 y.o.   MRN: [id number]   Subjective:    [name], female    DOB: 20-Sep-1945, 71 y.o.   MRN: [id number]  HPI  Patient with past history of hypercholesterolemia, hypertension and hypothyroidism.  She comes in today to follow up on these issues as well as for a complete physical exam.  She reports she has been doing well.  Was walking yesterday.  Noticed some low back pain.  Localized to her lower back.  No radiation of the pain.  No numbness or tingling.  No known injury.  Took advil.  Helped.  Is some better today.  States injured her back when she was little.  Has had no problems since.  Eating and drinking well.  Still skipping some of her blood pressure medications.  Is taking more than previous.  No chest pain.  No sob.  No acid reflux.  No abdominal pain.  Bowels moving.  Does have skin lesions on her neck.  Request referral to dermatology.     Past Medical History:  Diagnosis Date  . Anemia    h/o 40 yrs ago  . GERD (gastroesophageal reflux disease)   . Goiter    s/p thyroidectomy  . H/O syncope   . Hypercholesterolemia   . Hypothyroidism   . Recurrent sinus infections    and allergy problems   Past Surgical History:  Procedure Laterality Date  . COLONOSCOPY WITH PROPOFOL N/A 11/03/2014   Procedure: COLONOSCOPY WITH PROPOFOL;  Surgeon: Manya Silvas, MD;  Location: [facility] ENDOSCOPY;  Service: Endoscopy;  Laterality: N/A;  . [place]  . ESOPHAGOGASTRODUODENOSCOPY N/A 11/03/2014   Procedure: ESOPHAGOGASTRODUODENOSCOPY (EGD);  Surgeon: Manya Silvas, MD;  Location: [facility] ENDOSCOPY;  Service: Endoscopy;  Laterality: N/A;  . LAPAROSCOPIC APPENDECTOMY N/A 11/26/2014   Procedure: APPENDECTOMY LAPAROSCOPIC;  Surgeon: Robert Bellow, MD;  Location: [facility] ORS;  Service: General;  Laterality: N/A;  . thyroidectomy  1979   goiter  . TUBAL LIGATION  1975   Family History  Problem  Relation Age of Onset  . Tuberculosis Paternal Grandfather   . Alzheimer's disease Mother   . Goiter Mother    Social History   Social History  . Marital status: Married    Spouse name: N/A  . Number of children: N/A  . Years of education: N/A   Social History Main Topics  . Smoking status: Current Some Day Smoker    Types: Cigarettes  . Smokeless tobacco: Never Used  . Alcohol use 0.0 oz/week     Comment: occ  . Drug use: Yes    Types: Marijuana     Comment: marijuana every day  . Sexual activity: Not Asked   Other Topics Concern  . None   Social History Narrative  . None    Outpatient Encounter Prescriptions as of 06/25/2016  Medication Sig  . Cyanocobalamin (VITAMIN B 12 PO) Take by mouth.  . levothyroxine (SYNTHROID, LEVOTHROID) 50 MCG tablet Take 1 tablet (50 mcg total) by mouth daily.  . [DISCONTINUED] lisinopril (PRINIVIL,ZESTRIL) 10 MG tablet Take 1 tablet (10 mg total) by mouth daily. Hold until pt is ready for refill.  Marland Kitchen lisinopril (PRINIVIL,ZESTRIL) 20 MG tablet Take 1 tablet (20 mg total) by mouth daily.   No facility-administered encounter medications on file as of 06/25/2016.     Review of Systems  Constitutional: Negative for appetite change and unexpected weight  change.  HENT: Negative for congestion and sinus pressure.   Eyes: Negative for pain and visual disturbance.  Respiratory: Negative for cough, chest tightness and shortness of breath.   Cardiovascular: Negative for chest pain, palpitations and leg swelling.  Gastrointestinal: Negative for abdominal pain, diarrhea, nausea and vomiting.  Genitourinary: Negative for difficulty urinating and dysuria.  Musculoskeletal: Positive for back pain. Negative for joint swelling.  Skin: Negative for color change and rash.  Neurological: Negative for dizziness, light-headedness and headaches.  Hematological: Negative for adenopathy. Does not bruise/bleed easily.  Psychiatric/Behavioral: Negative for  agitation and dysphoric mood.       Objective:    Physical Exam  Constitutional: She is oriented to person, place, and time. She appears well-developed and well-nourished. No distress.  HENT:  Nose: Nose normal.  Mouth/Throat: Oropharynx is clear and moist.  Eyes: Right eye exhibits no discharge. Left eye exhibits no discharge. No scleral icterus.  Neck: Neck supple. No thyromegaly present.  Cardiovascular: Normal rate and regular rhythm.   Pulmonary/Chest: Breath sounds normal. No accessory muscle usage. No tachypnea. No respiratory distress. She has no decreased breath sounds. She has no wheezes. She has no rhonchi. Right breast exhibits no inverted nipple, no mass, no nipple discharge and no tenderness (no axillary adenopathy). Left breast exhibits no inverted nipple, no mass, no nipple discharge and no tenderness (no axilarry adenopathy).  Abdominal: Soft. Bowel sounds are normal. There is no tenderness.  Genitourinary:  Genitourinary Comments: Normal external genitalia.  Vaginal vault without lesions.  Cervix identified.  Pap smear performed.  Could not appreciate any adnexal masses or tenderness.    Musculoskeletal: She exhibits no edema or tenderness.  No tenderness to palpation lower back.  No significant pain with SLR.  Able to walk without difficulty.    Lymphadenopathy:    She has no cervical adenopathy.  Neurological: She is alert and oriented to person, place, and time.  Skin: Skin is warm. No rash noted. No erythema.  Psychiatric: She has a normal mood and affect. Her behavior is normal.    BP (!) 152/88 (BP Location: Left Arm, Patient Position: Sitting, Cuff Size: Large)   Pulse (!) 54   Temp 98.6 F (37 C) (Oral)   Resp 16   Ht 5\' 4"  (1.626 m)   Wt 129 lb (58.5 kg)   SpO2 98%   BMI 22.14 kg/m  Wt Readings from Last 3 Encounters:  06/25/16 129 lb (58.5 kg)  03/20/16 128 lb (58.1 kg)  02/01/16 124 lb 6.4 oz (56.4 kg)     Lab Results  Component Value Date    WBC 6.3 06/25/2016   HGB 13.2 06/25/2016   HCT 40.4 06/25/2016   PLT 229 06/25/2016   GLUCOSE 90 06/25/2016   CHOL 233 (H) 06/25/2016   TRIG 92 06/25/2016   HDL 89 06/25/2016   LDLCALC 126 (H) 06/25/2016   ALT 14 06/25/2016   AST 21 06/25/2016   NA 141 06/25/2016   K 4.6 06/25/2016   CL 106 06/25/2016   CREATININE 1.06 (H) 06/25/2016   BUN 15 06/25/2016   CO2 28 06/25/2016   TSH 5.13 (H) 06/25/2016    Mm Screening Breast Tomo Bilateral  Result Date: 06/10/2015 CLINICAL DATA:  Screening. EXAM: DIGITAL SCREENING BILATERAL MAMMOGRAM WITH 3D TOMO WITH CAD COMPARISON:  Previous exam(s). ACR Breast Density Category c: The breast tissue is heterogeneously dense, which may obscure small masses. FINDINGS: There are no findings suspicious for malignancy. Images were processed with CAD. IMPRESSION:  No mammographic evidence of malignancy. A result letter of this screening mammogram will be mailed directly to the patient. RECOMMENDATION: Screening mammogram in one year. (Code:SM-B-01Y) BI-RADS CATEGORY  1: Negative. Electronically Signed   By: Margarette Canada M.D.   On: 06/10/2015 09:07       Assessment & Plan:   Problem List Items Addressed This Visit    Anemia    Follow cbc.  Taking B12.       Relevant Medications   Cyanocobalamin (VITAMIN B 12 PO)   Other Relevant Orders   CBC w/Diff (Completed)   Essential hypertension, benign    Blood pressure remains elevated.  On lisinopril.  Doing better taking her medication.  Increased lisinopril to 20mg  q day.  Follow pressures.  Follow metabolic panel.  Get her back in soon to reassess.        Relevant Medications   lisinopril (PRINIVIL,ZESTRIL) 20 MG tablet   Other Relevant Orders   Hepatic function panel (Completed)   Basic Metabolic Panel (BMET) (Completed)   Health care maintenance    Physical today 06/25/16.  PAP 06/25/16.  Mammogram 06/10/15 - Birads I.  She declines [place] to schedule mammogram now.  Colonoscopy 10/2014 as outlined.         Relevant Orders   Lipid panel (Completed)   History of colonic polyps    Colonoscopy - 10/2014 as outlined.        Hypothyroidism    On thyroid replacement.  Follow tsh.       Relevant Orders   TSH (Completed)    Other Visit Diagnoses    Cervical cancer screening    -  Primary   Relevant Orders   Cytology - PAP   Acute midline low back pain without sciatica       Just started.  exam as outlined.  conservative measures.  tylenol.  follow.  noitfy me if persistent.         Einar Pheasant, MD

## 2016-06-25 NOTE — Progress Notes (Signed)
Pre-visit discussion using our clinic review tool. No additional management support is needed unless otherwise documented below in the visit note.  

## 2016-06-26 ENCOUNTER — Encounter: Payer: Self-pay | Admitting: Internal Medicine

## 2016-06-26 ENCOUNTER — Other Ambulatory Visit: Payer: Self-pay | Admitting: Internal Medicine

## 2016-06-26 DIAGNOSIS — E039 Hypothyroidism, unspecified: Secondary | ICD-10-CM

## 2016-06-26 MED ORDER — LEVOTHYROXINE SODIUM 75 MCG PO TABS: 75.0000 ug | ORAL_TABLET | Freq: Every day | ORAL | 1 refills | Status: DC

## 2016-06-26 NOTE — Assessment & Plan Note (Signed)
Blood pressure remains elevated.  On lisinopril.  Doing better taking her medication.  Increased lisinopril to 20mg  q day.  Follow pressures.  Follow metabolic panel.  Get her back in soon to reassess.

## 2016-06-26 NOTE — Assessment & Plan Note (Signed)
Follow cbc.  Taking B12.

## 2016-06-26 NOTE — Addendum Note (Signed)
Addended by: Francella Solian on: 06/26/2016 01:12 PM   Modules accepted: Orders

## 2016-06-26 NOTE — Progress Notes (Signed)
Order placed for f/u tsh.  

## 2016-06-26 NOTE — Assessment & Plan Note (Signed)
On thyroid replacement.  Follow tsh.  

## 2016-06-26 NOTE — Assessment & Plan Note (Signed)
Colonoscopy - 10/2014 as outlined.

## 2016-06-26 NOTE — Assessment & Plan Note (Signed)
Physical today 06/25/16.  PAP 06/25/16.  Mammogram 06/10/15 - Birads I.  She declines [place] to schedule mammogram now.  Colonoscopy 10/2014 as outlined.

## 2016-06-27 LAB — CYTOLOGY - PAP
Diagnosis: NEGATIVE
HPV: NOT DETECTED

## 2016-08-07 ENCOUNTER — Ambulatory Visit (INDEPENDENT_AMBULATORY_CARE_PROVIDER_SITE_OTHER): Payer: PPO | Admitting: Internal Medicine

## 2016-08-07 ENCOUNTER — Encounter: Payer: Self-pay | Admitting: Internal Medicine

## 2016-08-07 VITALS — BP 140/72 | HR 62 | Temp 98.6°F | Resp 12 | Ht 64.0 in | Wt 127.9 lb

## 2016-08-07 DIAGNOSIS — I10 Essential (primary) hypertension: Secondary | ICD-10-CM | POA: Diagnosis not present

## 2016-08-07 DIAGNOSIS — E039 Hypothyroidism, unspecified: Secondary | ICD-10-CM

## 2016-08-07 LAB — TSH: TSH: 0.24 u[IU]/mL — ABNORMAL LOW (ref 0.35–4.50)

## 2016-08-07 NOTE — Progress Notes (Signed)
[name], female   DOB: 09/10/1945, 71 y.o.   MRN: [id number]   Subjective:    [name], female    DOB: 1945-09-22, 71 y.o.   MRN: [id number]  HPI  Patient here for a scheduled follow up.  She is doing well. Eating well.  No chest pain.  No sob.  No acid reflux.  No abdominal pain.  Bowels moving.  Taking 20mg  lisinopril. Here to f/u on her blood pressure.       Past Medical History:  Diagnosis Date  . Anemia    h/o 40 yrs ago  . GERD (gastroesophageal reflux disease)   . Goiter    s/p thyroidectomy  . H/O syncope   . Hypercholesterolemia   . Hypothyroidism   . Recurrent sinus infections    and allergy problems   Past Surgical History:  Procedure Laterality Date  . COLONOSCOPY WITH PROPOFOL N/A 11/03/2014   Procedure: COLONOSCOPY WITH PROPOFOL;  Surgeon: Manya Silvas, MD;  Location: [facility] ENDOSCOPY;  Service: Endoscopy;  Laterality: N/A;  . [place]  . ESOPHAGOGASTRODUODENOSCOPY N/A 11/03/2014   Procedure: ESOPHAGOGASTRODUODENOSCOPY (EGD);  Surgeon: Manya Silvas, MD;  Location: [facility] ENDOSCOPY;  Service: Endoscopy;  Laterality: N/A;  . LAPAROSCOPIC APPENDECTOMY N/A 11/26/2014   Procedure: APPENDECTOMY LAPAROSCOPIC;  Surgeon: Robert Bellow, MD;  Location: [facility] ORS;  Service: General;  Laterality: N/A;  . thyroidectomy  1979   goiter  . TUBAL LIGATION  1975   Family History  Problem Relation Age of Onset  . Tuberculosis Paternal Grandfather   . Alzheimer's disease Mother   . Goiter Mother    Social History   Social History  . Marital status: Married    Spouse name: N/A  . Number of children: N/A  . Years of education: N/A   Social History Main Topics  . Smoking status: Current Some Day Smoker    Types: Cigarettes  . Smokeless tobacco: Never Used  . Alcohol use 0.0 oz/week     Comment: occ  . Drug use: Yes    Types: Marijuana     Comment: marijuana every day  . Sexual activity: Not Asked     Other Topics Concern  . None   Social History Narrative  . None    Outpatient Encounter Prescriptions as of 08/07/2016  Medication Sig  . Cyanocobalamin (VITAMIN B 12 PO) Take by mouth.  . levothyroxine (SYNTHROID) 75 MCG tablet Take 1 tablet (75 mcg total) by mouth daily before breakfast.  . lisinopril (PRINIVIL,ZESTRIL) 20 MG tablet Take 1 tablet (20 mg total) by mouth daily.  . [DISCONTINUED] levothyroxine (SYNTHROID, LEVOTHROID) 50 MCG tablet Take 1 tablet (50 mcg total) by mouth daily.   No facility-administered encounter medications on file as of 08/07/2016.     Review of Systems  Constitutional: Negative for appetite change and unexpected weight change.  HENT: Negative for congestion and sinus pressure.   Respiratory: Negative for cough, chest tightness and shortness of breath.   Cardiovascular: Negative for chest pain, palpitations and leg swelling.  Gastrointestinal: Negative for abdominal pain, diarrhea, nausea and vomiting.  Genitourinary: Negative for difficulty urinating and dysuria.  Musculoskeletal: Negative for back pain and joint swelling.  Skin: Negative for color change and rash.  Neurological: Negative for dizziness, light-headedness and headaches.  Psychiatric/Behavioral: Negative for agitation and dysphoric mood.       Objective:    Physical Exam  Constitutional: She appears well-developed and well-nourished.  No distress.  HENT:  Nose: Nose normal.  Mouth/Throat: Oropharynx is clear and moist.  Neck: Neck supple. No thyromegaly present.  Cardiovascular: Normal rate and regular rhythm.   Pulmonary/Chest: Breath sounds normal. No respiratory distress. She has no wheezes.  Abdominal: Soft. Bowel sounds are normal. There is no tenderness.  Musculoskeletal: She exhibits no edema or tenderness.  Lymphadenopathy:    She has no cervical adenopathy.  Skin: No rash noted. No erythema.  Psychiatric: She has a normal mood and affect. Her behavior is normal.     BP 140/72   Pulse 62   Temp 98.6 F (37 C) (Oral)   Resp 12   Ht 5\' 4"  (1.626 m)   Wt 127 lb 14.4 oz (58 kg)   SpO2 97%   BMI 21.95 kg/m  Wt Readings from Last 3 Encounters:  08/07/16 127 lb 14.4 oz (58 kg)  06/25/16 129 lb (58.5 kg)  03/20/16 128 lb (58.1 kg)     Lab Results  Component Value Date   WBC 6.3 06/25/2016   HGB 13.2 06/25/2016   HCT 40.4 06/25/2016   PLT 229 06/25/2016   GLUCOSE 90 06/25/2016   CHOL 233 (H) 06/25/2016   TRIG 92 06/25/2016   HDL 89 06/25/2016   LDLCALC 126 (H) 06/25/2016   ALT 14 06/25/2016   AST 21 06/25/2016   NA 141 06/25/2016   K 4.6 06/25/2016   CL 106 06/25/2016   CREATININE 1.06 (H) 06/25/2016   BUN 15 06/25/2016   CO2 28 06/25/2016   TSH 0.24 (L) 08/07/2016       Assessment & Plan:   Problem List Items Addressed This Visit    Essential hypertension, benign    On lisinopril 20mg  q day now.  Taking.  Blood pressure on recheck improved.  Have her spot check her pressure.  Follow metabolic panel.       Hypothyroidism - Primary    On thyroid medication.  Check tsh today.        Relevant Orders   TSH (Completed)       Einar Pheasant, MD

## 2016-08-07 NOTE — Progress Notes (Signed)
Pre-visit discussion using our clinic review tool. No additional management support is needed unless otherwise documented below in the visit note.  

## 2016-08-13 ENCOUNTER — Encounter: Payer: Self-pay | Admitting: Internal Medicine

## 2016-08-13 NOTE — Assessment & Plan Note (Signed)
On lisinopril 20mg  q day now.  Taking.  Blood pressure on recheck improved.  Have her spot check her pressure.  Follow metabolic panel.

## 2016-08-13 NOTE — Assessment & Plan Note (Signed)
On thyroid medication.  Check tsh today.

## 2016-09-26 ENCOUNTER — Other Ambulatory Visit (INDEPENDENT_AMBULATORY_CARE_PROVIDER_SITE_OTHER): Payer: PPO

## 2016-09-26 DIAGNOSIS — E039 Hypothyroidism, unspecified: Secondary | ICD-10-CM

## 2016-09-26 LAB — TSH: TSH: 3.93 u[IU]/mL (ref 0.35–4.50)

## 2016-12-26 ENCOUNTER — Other Ambulatory Visit: Payer: Self-pay | Admitting: Internal Medicine

## 2017-01-29 ENCOUNTER — Other Ambulatory Visit: Payer: Self-pay | Admitting: Internal Medicine

## 2017-04-03 ENCOUNTER — Other Ambulatory Visit: Payer: Self-pay

## 2017-04-03 MED ORDER — LISINOPRIL 20 MG PO TABS: 20.0000 mg | ORAL_TABLET | Freq: Every day | ORAL | 0 refills | Status: DC

## 2017-04-03 NOTE — Telephone Encounter (Signed)
Medication has been refilled.

## 2017-10-24 ENCOUNTER — Telehealth: Payer: Self-pay

## 2017-10-24 NOTE — Telephone Encounter (Signed)
Copied from [place] [id number]. Topic: Referral - Request >> Oct 24, 2017  3:40 PM Conception Chancy, NT wrote: Reason for CRM: patient is requesting a referral to get a colonoscopy done.

## 2017-10-24 NOTE — Telephone Encounter (Signed)
I can place order for referral.  She has a history of colon polyps.  Last colonoscopy 10/2014.  Is this referral just for routine f/u or is she having problems.  Thanks

## 2017-10-24 NOTE — Telephone Encounter (Signed)
Please advise 

## 2017-10-25 ENCOUNTER — Other Ambulatory Visit: Payer: Self-pay | Admitting: Internal Medicine

## 2017-10-25 DIAGNOSIS — Z86010 Personal history of colonic polyps: Secondary | ICD-10-CM

## 2017-10-25 NOTE — Telephone Encounter (Signed)
I called patient & she stated that this is just for a f/u. She said she was having no further issues.

## 2017-10-25 NOTE — Progress Notes (Signed)
Order for referral to GI placed.

## 2017-10-25 NOTE — Telephone Encounter (Signed)
Order for referral to GI placed.

## 2017-11-09 ENCOUNTER — Other Ambulatory Visit: Payer: Self-pay | Admitting: Internal Medicine

## 2017-11-19 DIAGNOSIS — Z86010 Personal history of colonic polyps: Secondary | ICD-10-CM | POA: Diagnosis not present

## 2018-02-10 ENCOUNTER — Telehealth: Payer: Self-pay | Admitting: Internal Medicine

## 2018-02-10 MED ORDER — LISINOPRIL 20 MG PO TABS: 20.0000 mg | ORAL_TABLET | Freq: Every day | ORAL | 0 refills | Status: DC

## 2018-02-10 NOTE — Telephone Encounter (Signed)
Copied from [place] [id number]. Topic: General - Other >> Feb 10, 2018 12:27 PM Lennox Solders wrote: Reason for CRM: [facility] [place] [address] [facility] the pt is new to them and needs new rx lisinopril 20 mg #90 w/refills

## 2018-02-11 NOTE — OR Nursing (Signed)
Spoke with Dr. Alvin Critchley regarding daily cannabinoid use, prior urine toxicology only positive for cannabinoid.  Dr. Kayleen Memos replied no urine drug screen is indicated.

## 2018-02-12 ENCOUNTER — Ambulatory Visit
Admission: RE | Admit: 2018-02-12 | Discharge: 2018-02-12 | Disposition: A | Discharge status: Home / Self Care | Payer: Medicare HMO | Source: Ambulatory Visit | Attending: Unknown Physician Specialty | Admitting: Unknown Physician Specialty

## 2018-02-12 ENCOUNTER — Encounter: Payer: Self-pay | Admitting: Anesthesiology

## 2018-02-12 ENCOUNTER — Ambulatory Visit: Payer: Medicare HMO | Admitting: Anesthesiology

## 2018-02-12 ENCOUNTER — Other Ambulatory Visit: Payer: Self-pay

## 2018-02-12 ENCOUNTER — Encounter
Admission: RE | Disposition: A | Discharge status: Home / Self Care | Payer: Self-pay | Source: Ambulatory Visit | Attending: Unknown Physician Specialty

## 2018-02-12 DIAGNOSIS — Z87891 Personal history of nicotine dependence: Secondary | ICD-10-CM | POA: Diagnosis not present

## 2018-02-12 DIAGNOSIS — K635 Polyp of colon: Secondary | ICD-10-CM | POA: Diagnosis not present

## 2018-02-12 DIAGNOSIS — D128 Benign neoplasm of rectum: Secondary | ICD-10-CM | POA: Insufficient documentation

## 2018-02-12 DIAGNOSIS — Z79899 Other long term (current) drug therapy: Secondary | ICD-10-CM | POA: Insufficient documentation

## 2018-02-12 DIAGNOSIS — D122 Benign neoplasm of ascending colon: Secondary | ICD-10-CM | POA: Insufficient documentation

## 2018-02-12 DIAGNOSIS — K219 Gastro-esophageal reflux disease without esophagitis: Secondary | ICD-10-CM | POA: Diagnosis not present

## 2018-02-12 DIAGNOSIS — E7800 Pure hypercholesterolemia, unspecified: Secondary | ICD-10-CM | POA: Insufficient documentation

## 2018-02-12 DIAGNOSIS — Z1211 Encounter for screening for malignant neoplasm of colon: Secondary | ICD-10-CM | POA: Insufficient documentation

## 2018-02-12 DIAGNOSIS — E039 Hypothyroidism, unspecified: Secondary | ICD-10-CM | POA: Diagnosis not present

## 2018-02-12 DIAGNOSIS — Z86010 Personal history of colonic polyps: Secondary | ICD-10-CM | POA: Insufficient documentation

## 2018-02-12 DIAGNOSIS — I10 Essential (primary) hypertension: Secondary | ICD-10-CM | POA: Diagnosis not present

## 2018-02-12 DIAGNOSIS — D125 Benign neoplasm of sigmoid colon: Secondary | ICD-10-CM | POA: Diagnosis not present

## 2018-02-12 DIAGNOSIS — D123 Benign neoplasm of transverse colon: Secondary | ICD-10-CM | POA: Diagnosis not present

## 2018-02-12 HISTORY — PX: COLONOSCOPY WITH PROPOFOL: SHX5780

## 2018-02-12 SURGERY — COLONOSCOPY WITH PROPOFOL
Anesthesia: General

## 2018-02-12 MED ORDER — PROPOFOL 500 MG/50ML IV EMUL
INTRAVENOUS | Status: AC
  Filled 2018-02-12: qty 50

## 2018-02-12 MED ORDER — PROPOFOL 10 MG/ML IV BOLUS
INTRAVENOUS | Status: DC | PRN
  Administered 2018-02-12: 20 mg via INTRAVENOUS
  Administered 2018-02-12: 30 mg via INTRAVENOUS
  Administered 2018-02-12: 50 mg via INTRAVENOUS

## 2018-02-12 MED ORDER — SODIUM CHLORIDE 0.9 % IV SOLN: INTRAVENOUS | Status: DC

## 2018-02-12 MED ORDER — PROPOFOL 500 MG/50ML IV EMUL
INTRAVENOUS | Status: DC | PRN
  Administered 2018-02-12: 80 ug/kg/min via INTRAVENOUS

## 2018-02-12 MED ORDER — SODIUM CHLORIDE 0.9 % IV SOLN
INTRAVENOUS | Status: DC
  Administered 2018-02-12 (×2): via INTRAVENOUS

## 2018-02-12 NOTE — Transfer of Care (Signed)
Immediate Anesthesia Transfer of Care Note  Patient: Jessica Wall  Procedure(s) Performed: COLONOSCOPY WITH PROPOFOL (N/A )  Patient Location: PACU  Anesthesia Type:General  Level of Consciousness: awake  Airway & Oxygen Therapy: Patient Spontanous Breathing  Post-op Assessment: Report given to RN  Post vital signs: stable  Last Vitals:  Vitals Value Taken Time  BP 122/72 02/12/2018 11:58 AM  Temp 36.2 C 02/12/2018 11:58 AM  Pulse 66 02/12/2018 11:59 AM  Resp 18 02/12/2018 11:59 AM  SpO2 100 % 02/12/2018 11:59 AM  Vitals shown include unvalidated device data.  Last Pain:  Vitals:   02/12/18 1158  TempSrc: Tympanic         Complications: No apparent anesthesia complications

## 2018-02-12 NOTE — H&P (Signed)
Primary Care Physician:  Einar Pheasant, MD Primary Gastroenterologist:  Dr. Vira Agar  Pre-Procedure History & Physical: HPI:  Jessica Wall is a 72 y.o. female is here for an colonoscopy.   Past Medical History:  Diagnosis Date  . GERD (gastroesophageal reflux disease)   . Goiter    s/p thyroidectomy  . H/O syncope   . Hypercholesterolemia   . Hypothyroidism   . Recurrent sinus infections    and allergy problems    Past Surgical History:  Procedure Laterality Date  . COLONOSCOPY WITH PROPOFOL N/A 11/03/2014   Procedure: COLONOSCOPY WITH PROPOFOL;  Surgeon: Manya Silvas, MD;  Location: [facility] ENDOSCOPY;  Service: Endoscopy;  Laterality: N/A;  . [place]  . ESOPHAGOGASTRODUODENOSCOPY N/A 11/03/2014   Procedure: ESOPHAGOGASTRODUODENOSCOPY (EGD);  Surgeon: Manya Silvas, MD;  Location: [facility] ENDOSCOPY;  Service: Endoscopy;  Laterality: N/A;  . LAPAROSCOPIC APPENDECTOMY N/A 11/26/2014   Procedure: APPENDECTOMY LAPAROSCOPIC;  Surgeon: Robert Bellow, MD;  Location: [facility] ORS;  Service: General;  Laterality: N/A;  . thyroidectomy  1979   goiter  . TUBAL LIGATION  1975    Prior to Admission medications   Medication Sig Start Date End Date Taking? Authorizing Provider  levothyroxine (SYNTHROID, LEVOTHROID) 50 MCG tablet Take 1 tablet (50 mcg total) by mouth daily. 01/29/17  Yes Einar Pheasant, MD  lisinopril (PRINIVIL,ZESTRIL) 20 MG tablet Take 1 tablet (20 mg total) by mouth daily. Needs to be seen for further refills. Last seen in 07/2016 02/10/18  Yes Einar Pheasant, MD  Cyanocobalamin (VITAMIN B 12 PO) Take by mouth.    [provider]  levothyroxine (SYNTHROID) 75 MCG tablet Take 1 tablet (75 mcg total) by mouth daily before breakfast. Patient not taking: Reported on 02/12/2018 06/26/16   Einar Pheasant, MD    Allergies as of 11/25/2017  . (No Known Allergies)    Family History  Problem Relation Age of Onset  .  Tuberculosis Paternal Grandfather   . Alzheimer's disease Mother   . Goiter Mother     Social History   Socioeconomic History  . Marital status: Married    Spouse name: Not on file  . Number of children: Not on file  . Years of education: Not on file  . Highest education level: Not on file  Occupational History  . Not on file  Social Needs  . Financial resource strain: Not on file  . Food insecurity:    Worry: Not on file    Inability: Not on file  . Transportation needs:    Medical: Not on file    Non-medical: Not on file  Tobacco Use  . Smoking status: Former Smoker    Types: Cigarettes    Last attempt to quit: 04/16/2009    Years since quitting: 8.8  . Smokeless tobacco: Never Used  Substance and Sexual Activity  . Alcohol use: Yes    Alcohol/week: 3.0 - 4.0 standard drinks    Types: 3 - 4 Standard drinks or equivalent per week    Comment: occ  . Drug use: Yes    Types: Marijuana    Comment: marijuana every day  . Sexual activity: Not on file  Lifestyle  . Physical activity:    Days per week: Not on file    Minutes per session: Not on file  . Stress: Not on file  Relationships  . Social connections:    Talks on phone: Not on file    Gets together:  Not on file    Attends religious service: Not on file    Active member of club or organization: Not on file    Attends meetings of clubs or organizations: Not on file    Relationship status: Not on file  . Intimate partner violence:    Fear of current or ex partner: Not on file    Emotionally abused: Not on file    Physically abused: Not on file    Forced sexual activity: Not on file  Other Topics Concern  . Not on file  Social History Narrative  . Not on file    Review of Systems: See HPI, otherwise negative ROS  Physical Exam: Pulse 68   Temp (!) 96.8 F (36 C) (Tympanic)   Resp 18   Ht 5' 3.5" (1.613 m)   Wt 56.7 kg   SpO2 100%   BMI 21.80 kg/m  General:   Alert,  pleasant and cooperative in  NAD Head:  Normocephalic and atraumatic. Neck:  Supple; no masses or thyromegaly. Lungs:  Clear throughout to auscultation.    Heart:  Regular rate and rhythm. Abdomen:  Soft, nontender and nondistended. Normal bowel sounds, without guarding, and without rebound.   Neurologic:  Alert and  oriented x4;  grossly normal neurologically.  Impression/Plan: Jessica Wall is here for an colonoscopy to be performed for [facility] colon polyps.  Risks, benefits, limitations, and alternatives regarding  colonoscopy have been reviewed with the patient.  Questions have been answered.  All parties agreeable.   Gaylyn Cheers, MD  02/12/2018, 11:08 AM

## 2018-02-12 NOTE — Anesthesia Preprocedure Evaluation (Signed)
Anesthesia Evaluation  Patient identified by MRN, date of birth, ID band Patient awake    Reviewed: Allergy & Precautions, NPO status , Patient's Chart, lab work & pertinent test results, reviewed documented beta blocker date and time   Airway Mallampati: II  TM Distance: >3 FB     Dental  (+) Chipped   Pulmonary former smoker,           Cardiovascular hypertension,      Neuro/Psych    GI/Hepatic GERD  ,  Endo/Other  Hypothyroidism   Renal/GU      Musculoskeletal  (+) Arthritis ,   Abdominal   Peds  Hematology  (+) anemia ,   Anesthesia Other Findings Smokes.  Reproductive/Obstetrics                             Anesthesia Physical Anesthesia Plan  ASA: III  Anesthesia Plan: General   Post-op Pain Management:    Induction: Intravenous  PONV Risk Score and Plan:   Airway Management Planned:   Additional Equipment:   Intra-op Plan:   Post-operative Plan:   Informed Consent: I have reviewed the patients History and Physical, chart, labs and discussed the procedure including the risks, benefits and alternatives for the proposed anesthesia with the patient or authorized representative who has indicated his/her understanding and acceptance.     Plan Discussed with: CRNA  Anesthesia Plan Comments:         Anesthesia Quick Evaluation

## 2018-02-12 NOTE — Anesthesia Post-op Follow-up Note (Signed)
Anesthesia QCDR form completed.        

## 2018-02-12 NOTE — Anesthesia Postprocedure Evaluation (Signed)
Anesthesia Post Note  Patient: Jessica Wall  Procedure(s) Performed: COLONOSCOPY WITH PROPOFOL (N/A )  Patient location during evaluation: Endoscopy Anesthesia Type: General Level of consciousness: awake and alert Pain management: pain level controlled Vital Signs Assessment: post-procedure vital signs reviewed and stable Respiratory status: spontaneous breathing, nonlabored ventilation, respiratory function stable and patient connected to nasal cannula oxygen Cardiovascular status: blood pressure returned to baseline and stable Postop Assessment: no apparent nausea or vomiting Anesthetic complications: no     Last Vitals:  Vitals:   02/12/18 1208 02/12/18 1218  BP: 138/85 (!) 153/76  Pulse: 60   Resp: 13 20  Temp:    SpO2: 100%     Last Pain:  Vitals:   02/12/18 1218  TempSrc:   PainSc: 0-No pain                 THOMAS,MATHAI S

## 2018-02-12 NOTE — Op Note (Signed)
[facility] Gastroenterology Patient Name: Jessica Wall Procedure Date: 02/12/2018 11:10 AM MRN: [id number] Account #: [id number] Date of Birth: 11-11-1945 Admit Type: Outpatient Age: 72 Room: [facility] ENDO ROOM 2 Gender: Female Note Status: Finalized Procedure:            Colonoscopy Indications:          High risk colon cancer surveillance: Personal history                        of colonic polyps Providers:            Manya Silvas, MD Referring MD:         Einar Pheasant, MD (Referring MD) Medicines:            Propofol per Anesthesia Complications:        No immediate complications. Procedure:            Pre-Anesthesia Assessment:                       - After reviewing the risks and benefits, the patient                        was deemed in satisfactory condition to undergo the                        procedure.                       After obtaining informed consent, the colonoscope was                        passed under direct vision. Throughout the procedure,                        the patient's blood pressure, pulse, and oxygen                        saturations were monitored continuously. The                        Colonoscope was introduced through the anus and                        advanced to the the cecum, identified by appendiceal                        orifice and ileocecal valve. The colonoscopy was                        performed without difficulty. The patient tolerated the                        procedure well. The quality of the bowel preparation                        was good. Findings:      A medium polyp was found in the ascending colon. The polyp was sessile.       The polyp was removed with a hot snare. Resection and retrieval were       complete. To prevent bleeding after the polypectomy, three hemostatic  clips were successfully placed. There was no bleeding at the end of the       procedure.      A small polyp was found in  the proximal ascending colon. The polyp was       sessile. The polyp was removed with a hot snare. Resection and retrieval       were complete.      A small polyp was found in the transverse colon. The polyp was sessile.       The polyp was removed with a hot snare. Resection and retrieval were       complete. To prevent bleeding after the polypectomy, one hemostatic clip       was successfully placed. There was no bleeding at the end of the       procedure.      A small polyp was found in the transverse colon. The polyp was sessile.       The polyp was removed with a hot snare. Resection and retrieval were       complete.      Six sessile polyps were found in the rectum. The polyps were small in       size. These polyps were removed with a jumbo cold forceps. Resection and       retrieval were complete. Impression:           - One medium polyp in the ascending colon, removed with                        a hot snare. Resected and retrieved. Clips were placed.                       - One small polyp in the proximal ascending colon,                        removed with a hot snare. Resected and retrieved.                       - One small polyp in the transverse colon, removed with                        a hot snare. Resected and retrieved. Clip was placed.                       - One small polyp in the transverse colon, removed with                        a hot snare. Resected and retrieved.                       - Six small polyps in the rectum, removed with a jumbo                        cold forceps. Resected and retrieved. Recommendation:       - Await pathology results. Manya Silvas, MD 02/12/2018 11:59:27 AM This report has been signed electronically. Number of Addenda: 0 Note Initiated On: 02/12/2018 11:10 AM Scope Withdrawal Time: 0 hours 33 minutes 35 seconds  Total Procedure Duration: 0 hours 38 minutes 24 seconds       [facility]

## 2018-02-13 ENCOUNTER — Encounter: Payer: Self-pay | Admitting: Unknown Physician Specialty

## 2018-02-15 LAB — SURGICAL PATHOLOGY

## 2018-02-18 ENCOUNTER — Ambulatory Visit (INDEPENDENT_AMBULATORY_CARE_PROVIDER_SITE_OTHER): Payer: Medicare HMO | Admitting: Internal Medicine

## 2018-02-18 ENCOUNTER — Encounter: Payer: Self-pay | Admitting: Internal Medicine

## 2018-02-18 VITALS — BP 156/72 | HR 64 | Temp 98.4°F | Resp 18 | Wt 125.6 lb

## 2018-02-18 DIAGNOSIS — Z1239 Encounter for other screening for malignant neoplasm of breast: Secondary | ICD-10-CM | POA: Diagnosis not present

## 2018-02-18 DIAGNOSIS — Z86010 Personal history of colonic polyps: Secondary | ICD-10-CM | POA: Diagnosis not present

## 2018-02-18 DIAGNOSIS — D649 Anemia, unspecified: Secondary | ICD-10-CM | POA: Diagnosis not present

## 2018-02-18 DIAGNOSIS — I10 Essential (primary) hypertension: Secondary | ICD-10-CM | POA: Diagnosis not present

## 2018-02-18 DIAGNOSIS — L989 Disorder of the skin and subcutaneous tissue, unspecified: Secondary | ICD-10-CM | POA: Diagnosis not present

## 2018-02-18 DIAGNOSIS — E039 Hypothyroidism, unspecified: Secondary | ICD-10-CM | POA: Diagnosis not present

## 2018-02-18 DIAGNOSIS — Z0000 Encounter for general adult medical examination without abnormal findings: Secondary | ICD-10-CM | POA: Diagnosis not present

## 2018-02-18 LAB — CBC WITH DIFFERENTIAL/PLATELET
Basophils Absolute: 0.1 10*3/uL (ref 0.0–0.1)
Basophils Relative: 2.5 % (ref 0.0–3.0)
Eosinophils Absolute: 0.1 10*3/uL (ref 0.0–0.7)
Eosinophils Relative: 2.4 % (ref 0.0–5.0)
HCT: 37.7 % (ref 36.0–46.0)
Hemoglobin: 12.5 g/dL (ref 12.0–15.0)
Lymphocytes Relative: 54.9 % — ABNORMAL HIGH (ref 12.0–46.0)
Lymphs Abs: 2.8 10*3/uL (ref 0.7–4.0)
MCHC: 33.3 g/dL (ref 30.0–36.0)
MCV: 98.9 fl (ref 78.0–100.0)
Monocytes Absolute: 0.3 10*3/uL (ref 0.1–1.0)
Monocytes Relative: 5.7 % (ref 3.0–12.0)
Neutro Abs: 1.8 10*3/uL (ref 1.4–7.7)
Neutrophils Relative %: 34.5 % — ABNORMAL LOW (ref 43.0–77.0)
Platelets: 243 10*3/uL (ref 150.0–400.0)
RBC: 3.81 Mil/uL — ABNORMAL LOW (ref 3.87–5.11)
RDW: 14.1 % (ref 11.5–15.5)
WBC: 5.2 10*3/uL (ref 4.0–10.5)

## 2018-02-18 LAB — LIPID PANEL
Cholesterol: 206 mg/dL — ABNORMAL HIGH (ref 0–200)
HDL: 80.5 mg/dL (ref >39.00)
LDL Cholesterol: 110 mg/dL — ABNORMAL HIGH (ref 0–99)
NonHDL: 125.02
Total CHOL/HDL Ratio: 3
Triglycerides: 74 mg/dL (ref 0.0–149.0)
VLDL: 14.8 mg/dL (ref 0.0–40.0)

## 2018-02-18 LAB — BASIC METABOLIC PANEL
BUN: 17 mg/dL (ref 6–23)
CO2: 30 mEq/L (ref 19–32)
Calcium: 9.1 mg/dL (ref 8.4–10.5)
Chloride: 105 mEq/L (ref 96–112)
Creatinine, Ser: 1.09 mg/dL (ref 0.40–1.20)
GFR: 63.7 mL/min (ref >60.00)
Glucose, Bld: 86 mg/dL (ref 70–99)
Potassium: 4 mEq/L (ref 3.5–5.1)
Sodium: 140 mEq/L (ref 135–145)

## 2018-02-18 LAB — HEPATIC FUNCTION PANEL
ALT: 11 U/L (ref 0–35)
AST: 19 U/L (ref 0–37)
Albumin: 4.1 g/dL (ref 3.5–5.2)
Alkaline Phosphatase: 64 U/L (ref 39–117)
Bilirubin, Direct: 0.1 mg/dL (ref 0.0–0.3)
Total Bilirubin: 0.5 mg/dL (ref 0.2–1.2)
Total Protein: 6.9 g/dL (ref 6.0–8.3)

## 2018-02-18 LAB — TSH: TSH: 3.88 u[IU]/mL (ref 0.35–4.50)

## 2018-02-18 MED ORDER — LISINOPRIL 40 MG PO TABS: 40.0000 mg | ORAL_TABLET | Freq: Every day | ORAL | 2 refills | Status: DC

## 2018-02-18 NOTE — Telephone Encounter (Signed)
Disregard message. Dr. Nicki Reaper has changed dose and sent in to [facility] in [place].

## 2018-02-18 NOTE — Progress Notes (Signed)
[name], female   DOB: August 06, 1945, 72 y.o.   MRN: [id number]   Subjective:    [name], female    DOB: [birth year], 72 y.o.   MRN: [id number]  HPI  Patient here for her physical exam.  She reports she feels good.  Stays active.  No chest pain.  No sob.  No acid reflux.  No abdominal pain.  Bowels moving.  No urine change.  Has persistent skin lesion on her thumb and finger.  Discussed dermatology referral.  Overdue mammogram.  Agreed to schedule.  Just had colonoscopy - 02/12/18.     Past Medical History:  Diagnosis Date  . GERD (gastroesophageal reflux disease)   . Goiter    s/p thyroidectomy  . H/O syncope   . Hypercholesterolemia   . Hypothyroidism   . Recurrent sinus infections    and allergy problems   Past Surgical History:  Procedure Laterality Date  . COLONOSCOPY WITH PROPOFOL N/A 11/03/2014   Procedure: COLONOSCOPY WITH PROPOFOL;  Surgeon: Manya Silvas, MD;  Location: [facility] ENDOSCOPY;  Service: Endoscopy;  Laterality: N/A;  . COLONOSCOPY WITH PROPOFOL N/A 02/12/2018   Procedure: COLONOSCOPY WITH PROPOFOL;  Surgeon: Manya Silvas, MD;  Location: [facility] ENDOSCOPY;  Service: Endoscopy;  Laterality: N/A;  . [place]  . ESOPHAGOGASTRODUODENOSCOPY N/A 11/03/2014   Procedure: ESOPHAGOGASTRODUODENOSCOPY (EGD);  Surgeon: Manya Silvas, MD;  Location: [facility] ENDOSCOPY;  Service: Endoscopy;  Laterality: N/A;  . LAPAROSCOPIC APPENDECTOMY N/A 11/26/2014   Procedure: APPENDECTOMY LAPAROSCOPIC;  Surgeon: Robert Bellow, MD;  Location: [facility] ORS;  Service: General;  Laterality: N/A;  . thyroidectomy  1979   goiter  . TUBAL LIGATION  1975   Family History  Problem Relation Age of Onset  . Tuberculosis Paternal Grandfather   . Alzheimer's disease Mother   . Goiter Mother    Social History   Socioeconomic History  . Marital status: Married    Spouse name: Not on file  . Number of children: Not on file  .  Years of education: Not on file  . Highest education level: Not on file  Occupational History  . Not on file  Social Needs  . Financial resource strain: Not on file  . Food insecurity:    Worry: Not on file    Inability: Not on file  . Transportation needs:    Medical: Not on file    Non-medical: Not on file  Tobacco Use  . Smoking status: Former Smoker    Types: Cigarettes    Last attempt to quit: 04/16/2009    Years since quitting: 8.8  . Smokeless tobacco: Never Used  Substance and Sexual Activity  . Alcohol use: Yes    Alcohol/week: 3.0 - 4.0 standard drinks    Types: 3 - 4 Standard drinks or equivalent per week    Comment: occ  . Drug use: Yes    Types: Marijuana    Comment: marijuana every day  . Sexual activity: Not on file  Lifestyle  . Physical activity:    Days per week: Not on file    Minutes per session: Not on file  . Stress: Not on file  Relationships  . Social connections:    Talks on phone: Not on file    Gets together: Not on file    Attends religious service: Not on file    Active member of club or organization: Not on file    Attends  meetings of clubs or organizations: Not on file    Relationship status: Not on file  Other Topics Concern  . Not on file  Social History Narrative  . Not on file    Outpatient Encounter Medications as of 02/18/2018  Medication Sig  . Cyanocobalamin (VITAMIN B 12 PO) Take by mouth.  . levothyroxine (SYNTHROID, LEVOTHROID) 50 MCG tablet Take 1 tablet (50 mcg total) by mouth daily.  Marland Kitchen lisinopril (PRINIVIL,ZESTRIL) 40 MG tablet Take 1 tablet (40 mg total) by mouth daily.  . [DISCONTINUED] levothyroxine (SYNTHROID) 75 MCG tablet Take 1 tablet (75 mcg total) by mouth daily before breakfast. (Patient not taking: Reported on 02/12/2018)  . [DISCONTINUED] lisinopril (PRINIVIL,ZESTRIL) 20 MG tablet Take 1 tablet (20 mg total) by mouth daily. Needs to be seen for further refills. Last seen in 07/2016   No facility-administered  encounter medications on file as of 02/18/2018.     Review of Systems  Constitutional: Negative for appetite change and unexpected weight change.  HENT: Negative for congestion and sinus pressure.   Eyes: Negative for pain and visual disturbance.  Respiratory: Negative for cough, chest tightness and shortness of breath.   Cardiovascular: Negative for chest pain, palpitations and leg swelling.  Gastrointestinal: Negative for abdominal pain, diarrhea, nausea and vomiting.  Genitourinary: Negative for difficulty urinating and dysuria.  Musculoskeletal: Negative for joint swelling and myalgias.  Skin: Negative for color change and rash.  Neurological: Negative for dizziness, light-headedness and headaches.  Hematological: Negative for adenopathy. Does not bruise/bleed easily.  Psychiatric/Behavioral: Negative for agitation and dysphoric mood.       Objective:    Physical Exam  Constitutional: She is oriented to person, place, and time. She appears well-developed and well-nourished. No distress.  HENT:  Nose: Nose normal.  Mouth/Throat: Oropharynx is clear and moist.  Eyes: Right eye exhibits no discharge. Left eye exhibits no discharge. No scleral icterus.  Neck: Neck supple. No thyromegaly present.  Cardiovascular: Normal rate and regular rhythm.  Pulmonary/Chest: Breath sounds normal. No accessory muscle usage. No tachypnea. No respiratory distress. She has no decreased breath sounds. She has no wheezes. She has no rhonchi. Right breast exhibits no inverted nipple, no mass, no nipple discharge and no tenderness (no axillary adenopathy). Left breast exhibits no inverted nipple, no mass, no nipple discharge and no tenderness (no axilarry adenopathy).  Abdominal: Soft. Bowel sounds are normal. There is no tenderness.  Musculoskeletal: She exhibits no edema or tenderness.  Lymphadenopathy:    She has no cervical adenopathy.  Neurological: She is alert and oriented to person, place, and  time.  Skin: No rash noted. No erythema.  Psychiatric: She has a normal mood and affect. Her behavior is normal.    BP (!) 156/72 (BP Location: Left Arm, Patient Position: Sitting, Cuff Size: Normal)   Pulse 64   Temp 98.4 F (36.9 C) (Oral)   Resp 18   Wt 125 lb 9.6 oz (57 kg)   SpO2 98%   BMI 21.90 kg/m  Wt Readings from Last 3 Encounters:  02/18/18 125 lb 9.6 oz (57 kg)  02/12/18 125 lb (56.7 kg)  08/07/16 127 lb 14.4 oz (58 kg)     Lab Results  Component Value Date   WBC 5.2 02/18/2018   HGB 12.5 02/18/2018   HCT 37.7 02/18/2018   PLT 243.0 02/18/2018   GLUCOSE 86 02/18/2018   CHOL 206 (H) 02/18/2018   TRIG 74.0 02/18/2018   HDL 80.50 02/18/2018   LDLCALC 110 (H) 02/18/2018  ALT 11 02/18/2018   AST 19 02/18/2018   NA 140 02/18/2018   K 4.0 02/18/2018   CL 105 02/18/2018   CREATININE 1.09 02/18/2018   BUN 17 02/18/2018   CO2 30 02/18/2018   TSH 3.88 02/18/2018       Assessment & Plan:   Problem List Items Addressed This Visit    Anemia    Taking B12.  Follow cbc.  Just had colonoscopy.        Relevant Orders   CBC with Differential/Platelet (Completed)   Essential hypertension, benign    Blood pressure elevated.  Increase lisinopril to 40mg  q day.  Follow pressures.  Check metabolic panel.        Relevant Medications   lisinopril (PRINIVIL,ZESTRIL) 40 MG tablet   Other Relevant Orders   Hepatic function panel (Completed)   Lipid panel (Completed)   Basic metabolic panel (Completed)   Health care maintenance    Physical today 02/18/18.  PAP 06/25/16 - negative with negative HPV.  Overdue mammogram.  Agreed to schedule.  Mammogram ordered.  Colonoscopy 02/12/18.        History of colonic polyps    Colonoscopy 02/12/18 as outlined.        Hypothyroidism    On thyroid replacement.  Follow tsh.       Relevant Orders   TSH (Completed)    Other Visit Diagnoses    Routine general medical examination at a health care facility    -  Primary    Breast cancer screening       Relevant Orders   MM 3D SCREEN BREAST BILATERAL   Skin lesion       Persistent lesions as outlined.  Request referral to dermatology.     Relevant Orders   Ambulatory referral to Dermatology       Einar Pheasant, MD

## 2018-02-19 ENCOUNTER — Other Ambulatory Visit: Payer: Self-pay

## 2018-02-19 MED ORDER — ROSUVASTATIN CALCIUM 10 MG PO TABS: 10.0000 mg | ORAL_TABLET | Freq: Every day | ORAL | 3 refills | Status: DC

## 2018-02-23 ENCOUNTER — Encounter: Payer: Self-pay | Admitting: Internal Medicine

## 2018-02-23 NOTE — Assessment & Plan Note (Signed)
Colonoscopy 02/12/18 as outlined.

## 2018-02-23 NOTE — Assessment & Plan Note (Signed)
Blood pressure elevated.  Increase lisinopril to 40mg  q day.  Follow pressures.  Check metabolic panel.

## 2018-02-23 NOTE — Assessment & Plan Note (Signed)
Physical today 02/18/18.  PAP 06/25/16 - negative with negative HPV.  Overdue mammogram.  Agreed to schedule.  Mammogram ordered.  Colonoscopy 02/12/18.

## 2018-02-23 NOTE — Assessment & Plan Note (Signed)
On thyroid replacement.  Follow tsh.  

## 2018-02-23 NOTE — Assessment & Plan Note (Signed)
Taking B12.  Follow cbc.  Just had colonoscopy.

## 2018-03-05 ENCOUNTER — Other Ambulatory Visit: Payer: Self-pay | Admitting: Internal Medicine

## 2018-03-05 MED ORDER — LEVOTHYROXINE SODIUM 50 MCG PO TABS: ORAL_TABLET | ORAL | 1 refills | Status: DC

## 2018-03-05 NOTE — Telephone Encounter (Signed)
Copied from [place] [phone]. Topic: Quick Communication - Rx Refill/Question >> Mar 05, 2018  9:49 AM Marin Olp L wrote: Medication: levothyroxine (SYNTHROID, LEVOTHROID) 50 MCG tablet   Has the patient contacted their pharmacy? Yes.   (Agent: If no, request that the patient contact the pharmacy for the refill.) (Agent: If yes, when and what did the pharmacy advise?)  Preferred Pharmacy (with phone number or street name): [id number] - [place], [address] [place] [address] Phone: [phone] Fax: [phone]  Agent: Please be advised that RX refills may take up to 3 business days. We ask that you follow-up with your pharmacy.

## 2018-03-12 ENCOUNTER — Other Ambulatory Visit: Payer: Self-pay | Admitting: Internal Medicine

## 2018-03-24 ENCOUNTER — Ambulatory Visit
Admission: RE | Admit: 2018-03-24 | Discharge: 2018-03-24 | Disposition: A | Discharge status: Home / Self Care | Payer: Medicare HMO | Source: Ambulatory Visit | Attending: Internal Medicine | Admitting: Internal Medicine

## 2018-03-24 DIAGNOSIS — Z1239 Encounter for other screening for malignant neoplasm of breast: Secondary | ICD-10-CM

## 2018-03-24 DIAGNOSIS — Z1231 Encounter for screening mammogram for malignant neoplasm of breast: Secondary | ICD-10-CM | POA: Insufficient documentation

## 2018-04-01 ENCOUNTER — Telehealth: Payer: Self-pay | Admitting: *Deleted

## 2018-04-01 DIAGNOSIS — E7800 Pure hypercholesterolemia, unspecified: Secondary | ICD-10-CM

## 2018-04-01 NOTE — Telephone Encounter (Signed)
Order placed for f/u liver panel.  

## 2018-04-01 NOTE — Telephone Encounter (Signed)
Pt has lab appt tomorrow afternoon (04/02/18).  Please place future lab orders.

## 2018-04-02 ENCOUNTER — Other Ambulatory Visit (INDEPENDENT_AMBULATORY_CARE_PROVIDER_SITE_OTHER): Payer: Medicare HMO

## 2018-04-02 DIAGNOSIS — E039 Hypothyroidism, unspecified: Secondary | ICD-10-CM | POA: Diagnosis not present

## 2018-04-02 DIAGNOSIS — E785 Hyperlipidemia, unspecified: Secondary | ICD-10-CM | POA: Diagnosis not present

## 2018-04-02 DIAGNOSIS — E7800 Pure hypercholesterolemia, unspecified: Secondary | ICD-10-CM

## 2018-04-02 DIAGNOSIS — I10 Essential (primary) hypertension: Secondary | ICD-10-CM | POA: Diagnosis not present

## 2018-04-02 DIAGNOSIS — Z7722 Contact with and (suspected) exposure to environmental tobacco smoke (acute) (chronic): Secondary | ICD-10-CM | POA: Diagnosis not present

## 2018-04-02 DIAGNOSIS — Z87891 Personal history of nicotine dependence: Secondary | ICD-10-CM | POA: Diagnosis not present

## 2018-04-02 DIAGNOSIS — Z8249 Family history of ischemic heart disease and other diseases of the circulatory system: Secondary | ICD-10-CM | POA: Diagnosis not present

## 2018-04-02 DIAGNOSIS — Z809 Family history of malignant neoplasm, unspecified: Secondary | ICD-10-CM | POA: Diagnosis not present

## 2018-04-02 LAB — HEPATIC FUNCTION PANEL
ALT: 16 U/L (ref 0–35)
AST: 20 U/L (ref 0–37)
Albumin: 3.9 g/dL (ref 3.5–5.2)
Alkaline Phosphatase: 63 U/L (ref 39–117)
Bilirubin, Direct: 0.1 mg/dL (ref 0.0–0.3)
Total Bilirubin: 0.3 mg/dL (ref 0.2–1.2)
Total Protein: 6.4 g/dL (ref 6.0–8.3)

## 2018-04-22 DIAGNOSIS — B078 Other viral warts: Secondary | ICD-10-CM | POA: Diagnosis not present

## 2018-04-22 DIAGNOSIS — D239 Other benign neoplasm of skin, unspecified: Secondary | ICD-10-CM | POA: Diagnosis not present

## 2018-04-22 DIAGNOSIS — L821 Other seborrheic keratosis: Secondary | ICD-10-CM | POA: Diagnosis not present

## 2018-04-22 DIAGNOSIS — D485 Neoplasm of uncertain behavior of skin: Secondary | ICD-10-CM | POA: Diagnosis not present

## 2018-04-22 DIAGNOSIS — L720 Epidermal cyst: Secondary | ICD-10-CM | POA: Diagnosis not present

## 2018-04-24 ENCOUNTER — Encounter: Payer: Self-pay | Admitting: Internal Medicine

## 2018-04-24 ENCOUNTER — Ambulatory Visit (INDEPENDENT_AMBULATORY_CARE_PROVIDER_SITE_OTHER): Payer: Medicare HMO | Admitting: Internal Medicine

## 2018-04-24 DIAGNOSIS — E7800 Pure hypercholesterolemia, unspecified: Secondary | ICD-10-CM | POA: Diagnosis not present

## 2018-04-24 DIAGNOSIS — D649 Anemia, unspecified: Secondary | ICD-10-CM

## 2018-04-24 DIAGNOSIS — I10 Essential (primary) hypertension: Secondary | ICD-10-CM | POA: Diagnosis not present

## 2018-04-24 DIAGNOSIS — E039 Hypothyroidism, unspecified: Secondary | ICD-10-CM

## 2018-04-24 NOTE — Progress Notes (Signed)
[name], female   DOB: 05-Jan-1946, 73 y.o.   MRN: [id number]   Subjective:    [name], female    DOB: 1946-03-12, 73 y.o.   MRN: [id number]  HPI  Patient here for a scheduled follow up.  States she is feeling good.  No chest pain.  No sob.  Stays active.  No acid reflux.  No abdominal pain.  Bowels moving.  Just saw dermatology.  Had lesion removed from thumb and frozen off nose.  Taking her medication.  Blood pressure doing better.     Past Medical History:  Diagnosis Date  . GERD (gastroesophageal reflux disease)   . Goiter    s/p thyroidectomy  . H/O syncope   . Hypercholesterolemia   . Hypothyroidism   . Recurrent sinus infections    and allergy problems   Past Surgical History:  Procedure Laterality Date  . COLONOSCOPY WITH PROPOFOL N/A 11/03/2014   Procedure: COLONOSCOPY WITH PROPOFOL;  Surgeon: Manya Silvas, MD;  Location: [facility] ENDOSCOPY;  Service: Endoscopy;  Laterality: N/A;  . COLONOSCOPY WITH PROPOFOL N/A 02/12/2018   Procedure: COLONOSCOPY WITH PROPOFOL;  Surgeon: Manya Silvas, MD;  Location: [facility] ENDOSCOPY;  Service: Endoscopy;  Laterality: N/A;  . [place]  . ESOPHAGOGASTRODUODENOSCOPY N/A 11/03/2014   Procedure: ESOPHAGOGASTRODUODENOSCOPY (EGD);  Surgeon: Manya Silvas, MD;  Location: [facility] ENDOSCOPY;  Service: Endoscopy;  Laterality: N/A;  . LAPAROSCOPIC APPENDECTOMY N/A 11/26/2014   Procedure: APPENDECTOMY LAPAROSCOPIC;  Surgeon: Robert Bellow, MD;  Location: [facility] ORS;  Service: General;  Laterality: N/A;  . thyroidectomy  1979   goiter  . TUBAL LIGATION  1975   Family History  Problem Relation Age of Onset  . Tuberculosis Paternal Grandfather   . Alzheimer's disease Mother   . Goiter Mother    Social History   Socioeconomic History  . Marital status: Married    Spouse name: Not on file  . Number of children: Not on file  . Years of education: Not on file  . Highest  education level: Not on file  Occupational History  . Not on file  Social Needs  . Financial resource strain: Not on file  . Food insecurity:    Worry: Not on file    Inability: Not on file  . Transportation needs:    Medical: Not on file    Non-medical: Not on file  Tobacco Use  . Smoking status: Former Smoker    Types: Cigarettes    Last attempt to quit: 04/16/2009    Years since quitting: 9.0  . Smokeless tobacco: Never Used  Substance and Sexual Activity  . Alcohol use: Yes    Alcohol/week: 3.0 - 4.0 standard drinks    Types: 3 - 4 Standard drinks or equivalent per week    Comment: occ  . Drug use: Yes    Types: Marijuana    Comment: marijuana every day  . Sexual activity: Not on file  Lifestyle  . Physical activity:    Days per week: Not on file    Minutes per session: Not on file  . Stress: Not on file  Relationships  . Social connections:    Talks on phone: Not on file    Gets together: Not on file    Attends religious service: Not on file    Active member of club or organization: Not on file    Attends meetings of clubs or organizations: Not on  file    Relationship status: Not on file  Other Topics Concern  . Not on file  Social History Narrative  . Not on file    Outpatient Encounter Medications as of 04/24/2018  Medication Sig  . Cyanocobalamin (VITAMIN B 12 PO) Take by mouth.  . levothyroxine (SYNTHROID, LEVOTHROID) 50 MCG tablet Take 1 tablet (50 mcg total) by mouth daily.  Marland Kitchen lisinopril (PRINIVIL,ZESTRIL) 40 MG tablet TAKE 1 TABLET BY MOUTH EVERY DAY  . rosuvastatin (CRESTOR) 10 MG tablet Take 1 tablet (10 mg total) by mouth daily.   No facility-administered encounter medications on file as of 04/24/2018.     Review of Systems  Constitutional: Negative for appetite change and unexpected weight change.  HENT: Negative for congestion and sinus pressure.   Respiratory: Negative for cough, chest tightness and shortness of breath.   Cardiovascular: Negative  for chest pain, palpitations and leg swelling.  Gastrointestinal: Negative for abdominal pain, diarrhea, nausea and vomiting.  Genitourinary: Negative for difficulty urinating and dysuria.  Musculoskeletal: Negative for joint swelling and myalgias.  Skin: Negative for color change and rash.  Neurological: Negative for dizziness, light-headedness and headaches.  Psychiatric/Behavioral: Negative for agitation and dysphoric mood.       Objective:    Physical Exam Constitutional:      General: She is not in acute distress.    Appearance: Normal appearance.  HENT:     Nose: Nose normal. No congestion.     Mouth/Throat:     Pharynx: No oropharyngeal exudate or posterior oropharyngeal erythema.  Neck:     Musculoskeletal: Neck supple. No muscular tenderness.     Thyroid: No thyromegaly.  Cardiovascular:     Rate and Rhythm: Normal rate and regular rhythm.  Pulmonary:     Effort: No respiratory distress.     Breath sounds: Normal breath sounds. No wheezing.  Abdominal:     General: Bowel sounds are normal.     Palpations: Abdomen is soft.     Tenderness: There is no abdominal tenderness.  Musculoskeletal:        General: No swelling or tenderness.  Lymphadenopathy:     Cervical: No cervical adenopathy.  Skin:    Findings: No erythema or rash.  Neurological:     Mental Status: She is alert.  Psychiatric:        Mood and Affect: Mood normal.        Behavior: Behavior normal.     BP 122/74 (BP Location: Left Arm, Patient Position: Sitting, Cuff Size: Normal)   Pulse 68   Temp 98.4 F (36.9 C) (Oral)   Resp 16   Wt 124 lb 12.8 oz (56.6 kg)   SpO2 98%   BMI 21.76 kg/m  Wt Readings from Last 3 Encounters:  04/24/18 124 lb 12.8 oz (56.6 kg)  02/18/18 125 lb 9.6 oz (57 kg)  02/12/18 125 lb (56.7 kg)     Lab Results  Component Value Date   WBC 5.2 02/18/2018   HGB 12.5 02/18/2018   HCT 37.7 02/18/2018   PLT 243.0 02/18/2018   GLUCOSE 86 02/18/2018   CHOL 206 (H)  02/18/2018   TRIG 74.0 02/18/2018   HDL 80.50 02/18/2018   LDLCALC 110 (H) 02/18/2018   ALT 16 04/02/2018   AST 20 04/02/2018   NA 140 02/18/2018   K 4.0 02/18/2018   CL 105 02/18/2018   CREATININE 1.09 02/18/2018   BUN 17 02/18/2018   CO2 30 02/18/2018   TSH 3.88 02/18/2018  Mm 3d Screen Breast Bilateral  Result Date: 03/24/2018 CLINICAL DATA:  Screening. EXAM: DIGITAL SCREENING BILATERAL MAMMOGRAM WITH TOMO AND CAD COMPARISON:  Previous exam(s). ACR Breast Density Category d: The breast tissue is extremely dense, which lowers the sensitivity of mammography FINDINGS: There are no findings suspicious for malignancy. Images were processed with CAD. IMPRESSION: No mammographic evidence of malignancy. A result letter of this screening mammogram will be mailed directly to the patient. RECOMMENDATION: Screening mammogram in one year. (Code:SM-B-01Y) BI-RADS CATEGORY  1: Negative. Electronically Signed   By: Franki Cabot M.D.   On: 03/24/2018 16:11       Assessment & Plan:   Problem List Items Addressed This Visit    Anemia    Follow cbc.       Relevant Orders   CBC with Differential/Platelet   Essential hypertension, benign    Blood pressure under good control.  Continue same medication regimen.  Follow pressures.  Follow metabolic panel.        Relevant Orders   Basic metabolic panel   Hypercholesterolemia    Low cholesterol diet and exercise.  On crestor.  Follow lipid panel and liver function tests.        Relevant Orders   Hepatic function panel   Lipid panel   Hypothyroidism    On thyroid replacement.  Follow tsh.            Einar Pheasant, MD

## 2018-04-27 ENCOUNTER — Encounter: Payer: Self-pay | Admitting: Internal Medicine

## 2018-04-27 DIAGNOSIS — E7800 Pure hypercholesterolemia, unspecified: Secondary | ICD-10-CM | POA: Insufficient documentation

## 2018-04-27 NOTE — Assessment & Plan Note (Signed)
Low cholesterol diet and exercise.  On crestor.  Follow lipid panel and liver function tests.  

## 2018-04-27 NOTE — Assessment & Plan Note (Signed)
Follow cbc.  

## 2018-04-27 NOTE — Assessment & Plan Note (Signed)
On thyroid replacement.  Follow tsh.  

## 2018-04-27 NOTE — Assessment & Plan Note (Signed)
Blood pressure under good control.  Continue same medication regimen.  Follow pressures.  Follow metabolic panel.   

## 2018-05-27 DIAGNOSIS — B078 Other viral warts: Secondary | ICD-10-CM | POA: Diagnosis not present

## 2018-06-09 ENCOUNTER — Other Ambulatory Visit: Payer: Self-pay | Admitting: Internal Medicine

## 2018-07-01 DIAGNOSIS — B078 Other viral warts: Secondary | ICD-10-CM | POA: Diagnosis not present

## 2018-08-20 ENCOUNTER — Other Ambulatory Visit: Payer: Self-pay

## 2018-08-20 ENCOUNTER — Other Ambulatory Visit (INDEPENDENT_AMBULATORY_CARE_PROVIDER_SITE_OTHER): Payer: Medicare HMO

## 2018-08-20 DIAGNOSIS — D649 Anemia, unspecified: Secondary | ICD-10-CM

## 2018-08-20 DIAGNOSIS — I10 Essential (primary) hypertension: Secondary | ICD-10-CM | POA: Diagnosis not present

## 2018-08-20 DIAGNOSIS — E7800 Pure hypercholesterolemia, unspecified: Secondary | ICD-10-CM

## 2018-08-20 LAB — LIPID PANEL
Cholesterol: 157 mg/dL (ref 0–200)
HDL: 81.4 mg/dL (ref >39.00)
LDL Cholesterol: 63 mg/dL (ref 0–99)
NonHDL: 75.83
Total CHOL/HDL Ratio: 2
Triglycerides: 62 mg/dL (ref 0.0–149.0)
VLDL: 12.4 mg/dL (ref 0.0–40.0)

## 2018-08-20 LAB — HEPATIC FUNCTION PANEL
ALT: 33 U/L (ref 0–35)
AST: 32 U/L (ref 0–37)
Albumin: 4.1 g/dL (ref 3.5–5.2)
Alkaline Phosphatase: 71 U/L (ref 39–117)
Bilirubin, Direct: 0.1 mg/dL (ref 0.0–0.3)
Total Bilirubin: 0.4 mg/dL (ref 0.2–1.2)
Total Protein: 6.9 g/dL (ref 6.0–8.3)

## 2018-08-20 LAB — CBC WITH DIFFERENTIAL/PLATELET
Basophils Absolute: 0 10*3/uL (ref 0.0–0.1)
Basophils Relative: 0.8 % (ref 0.0–3.0)
Eosinophils Absolute: 0.1 10*3/uL (ref 0.0–0.7)
Eosinophils Relative: 1.3 % (ref 0.0–5.0)
HCT: 36.7 % (ref 36.0–46.0)
Hemoglobin: 12.5 g/dL (ref 12.0–15.0)
Lymphocytes Relative: 45.2 % (ref 12.0–46.0)
Lymphs Abs: 2.7 10*3/uL (ref 0.7–4.0)
MCHC: 34.2 g/dL (ref 30.0–36.0)
MCV: 98.1 fl (ref 78.0–100.0)
Monocytes Absolute: 0.3 10*3/uL (ref 0.1–1.0)
Monocytes Relative: 5.1 % (ref 3.0–12.0)
Neutro Abs: 2.8 10*3/uL (ref 1.4–7.7)
Neutrophils Relative %: 47.6 % (ref 43.0–77.0)
Platelets: 193 10*3/uL (ref 150.0–400.0)
RBC: 3.74 Mil/uL — ABNORMAL LOW (ref 3.87–5.11)
RDW: 14.5 % (ref 11.5–15.5)
WBC: 5.9 10*3/uL (ref 4.0–10.5)

## 2018-08-20 LAB — BASIC METABOLIC PANEL
BUN: 13 mg/dL (ref 6–23)
CO2: 27 mEq/L (ref 19–32)
Calcium: 8.9 mg/dL (ref 8.4–10.5)
Chloride: 106 mEq/L (ref 96–112)
Creatinine, Ser: 1.14 mg/dL (ref 0.40–1.20)
GFR: 56.83 mL/min — ABNORMAL LOW (ref >60.00)
Glucose, Bld: 114 mg/dL — ABNORMAL HIGH (ref 70–99)
Potassium: 4.4 mEq/L (ref 3.5–5.1)
Sodium: 140 mEq/L (ref 135–145)

## 2018-08-27 ENCOUNTER — Encounter: Payer: Self-pay | Admitting: Internal Medicine

## 2018-08-27 ENCOUNTER — Other Ambulatory Visit: Payer: Self-pay

## 2018-08-27 ENCOUNTER — Ambulatory Visit (INDEPENDENT_AMBULATORY_CARE_PROVIDER_SITE_OTHER): Payer: Medicare HMO | Admitting: Internal Medicine

## 2018-08-27 DIAGNOSIS — E039 Hypothyroidism, unspecified: Secondary | ICD-10-CM

## 2018-08-27 DIAGNOSIS — E7800 Pure hypercholesterolemia, unspecified: Secondary | ICD-10-CM | POA: Diagnosis not present

## 2018-08-27 DIAGNOSIS — I10 Essential (primary) hypertension: Secondary | ICD-10-CM | POA: Diagnosis not present

## 2018-08-27 DIAGNOSIS — D649 Anemia, unspecified: Secondary | ICD-10-CM

## 2018-08-27 MED ORDER — LISINOPRIL 40 MG PO TABS: 40.0000 mg | ORAL_TABLET | Freq: Every day | ORAL | 1 refills | Status: DC

## 2018-08-27 MED ORDER — LEVOTHYROXINE SODIUM 50 MCG PO TABS: ORAL_TABLET | ORAL | 1 refills | Status: DC

## 2018-08-27 NOTE — Progress Notes (Signed)
[name], female   DOB: [birth year], 73 y.o.   MRN: [id number]   Virtual Visit via telephone Note  This visit type was conducted due to national recommendations for restrictions regarding the COVID-19 pandemic (e.g. social distancing).  This format is felt to be most appropriate for this patient at this time.  All issues noted in this document were discussed and addressed.  No physical exam was performed (except for noted visual exam findings with Video Visits).   I connected with Jessica Wall telephone and verified that I am speaking with the correct person using two identifiers. Location patient: home Location provider: work Persons participating in the telephone visit: patient, provider  I discussed the limitations, risks, security and privacy concerns of performing an evaluation and management service by telephone and the availability of in person appointments. The patient expressed understanding and agreed to proceed.   Reason for visit: scheduled follow up.   HPI: She reports she is doing well.  Feels good.  Staying active  No chest pain. No sob.  No acid reflux.  No abdominal pain.  Bowels moving.  No urine change.  Discussed recent labs.  Cholesterol improved.  Tolerating crestor.  States her blood pressure has been doing well.  Feels good.     ROS: See pertinent positives and negatives per HPI.  Past Medical History:  Diagnosis Date  . GERD (gastroesophageal reflux disease)   . Goiter    s/p thyroidectomy  . H/O syncope   . Hypercholesterolemia   . Hypothyroidism   . Recurrent sinus infections    and allergy problems    Past Surgical History:  Procedure Laterality Date  . COLONOSCOPY WITH PROPOFOL N/A 11/03/2014   Procedure: COLONOSCOPY WITH PROPOFOL;  Surgeon: Manya Silvas, MD;  Location: [facility] ENDOSCOPY;  Service: Endoscopy;  Laterality: N/A;  . COLONOSCOPY WITH PROPOFOL N/A 02/12/2018   Procedure: COLONOSCOPY WITH PROPOFOL;  Surgeon: Manya Silvas, MD;  Location: [facility] ENDOSCOPY;  Service: Endoscopy;  Laterality: N/A;  . [place]  . ESOPHAGOGASTRODUODENOSCOPY N/A 11/03/2014   Procedure: ESOPHAGOGASTRODUODENOSCOPY (EGD);  Surgeon: Manya Silvas, MD;  Location: [facility] ENDOSCOPY;  Service: Endoscopy;  Laterality: N/A;  . LAPAROSCOPIC APPENDECTOMY N/A 11/26/2014   Procedure: APPENDECTOMY LAPAROSCOPIC;  Surgeon: Robert Bellow, MD;  Location: [facility] ORS;  Service: General;  Laterality: N/A;  . thyroidectomy  1979   goiter  . TUBAL LIGATION  1975    Family History  Problem Relation Age of Onset  . Tuberculosis Paternal Grandfather   . Alzheimer's disease Mother   . Goiter Mother     SOCIAL HX: reviewed.    Current Outpatient Medications:  .  Cyanocobalamin (VITAMIN B 12 PO), Take by mouth., Disp: , Rfl:  .  levothyroxine (SYNTHROID) 50 MCG tablet, Take 1 tablet (50 mcg total) by mouth daily., Disp: 90 tablet, Rfl: 1 .  lisinopril (ZESTRIL) 40 MG tablet, Take 1 tablet (40 mg total) by mouth daily., Disp: 90 tablet, Rfl: 1 .  rosuvastatin (CRESTOR) 10 MG tablet, Take 1 tablet (10 mg total) by mouth daily., Disp: 90 tablet, Rfl: 3  EXAM:  GENERAL: alert.  Answering questions appropriately.  Sounds to be in no acute distress.    PSYCH/NEURO: pleasant and cooperative, no obvious depression or anxiety, speech and thought processing grossly intact  ASSESSMENT AND PLAN:  Discussed the following assessment and plan:  Anemia, unspecified type  Essential hypertension, benign  Hypercholesterolemia  Hypothyroidism, unspecified type  Anemia hgb just checked 12.5.  Follow.    Essential hypertension, benign Blood pressure has been under good control.  Continue current medication regimen.  Follow pressures.  Follow metabolic panel.   Hypercholesterolemia On crestor.  Low cholesterol diet and exercise.  Follow lipid panel and liver function tests.   Lab Results  Component Value Date   CHOL  157 08/20/2018   HDL 81.40 08/20/2018   LDLCALC 63 08/20/2018   TRIG 62.0 08/20/2018   CHOLHDL 2 08/20/2018    Hypothyroidism On thyroid replacement.  Follow tsh.     I discussed the assessment and treatment plan with the patient. The patient was provided an opportunity to ask questions and all were answered. The patient agreed with the plan and demonstrated an understanding of the instructions.   The patient was advised to call back or seek an in-person evaluation if the symptoms worsen or if the condition fails to improve as anticipated.  I provided 15 minutes of non-face-to-face time during this encounter.   Einar Pheasant, MD

## 2018-08-31 ENCOUNTER — Encounter: Payer: Self-pay | Admitting: Internal Medicine

## 2018-08-31 NOTE — Assessment & Plan Note (Signed)
On thyroid replacement.  Follow tsh.  

## 2018-08-31 NOTE — Assessment & Plan Note (Signed)
hgb just checked 12.5.  Follow.

## 2018-08-31 NOTE — Assessment & Plan Note (Signed)
Blood pressure has been under good control.  Continue current medication regimen.  Follow pressures.  Follow metabolic panel.  

## 2018-08-31 NOTE — Assessment & Plan Note (Signed)
On crestor.  Low cholesterol diet and exercise.  Follow lipid panel and liver function tests.   Lab Results  Component Value Date   CHOL 157 08/20/2018   HDL 81.40 08/20/2018   LDLCALC 63 08/20/2018   TRIG 62.0 08/20/2018   CHOLHDL 2 08/20/2018

## 2019-02-12 ENCOUNTER — Other Ambulatory Visit: Payer: Self-pay | Admitting: Internal Medicine

## 2019-02-16 ENCOUNTER — Other Ambulatory Visit: Payer: Self-pay | Admitting: Internal Medicine

## 2019-02-20 ENCOUNTER — Other Ambulatory Visit: Payer: Self-pay | Admitting: Internal Medicine

## 2019-02-22 ENCOUNTER — Other Ambulatory Visit: Payer: Self-pay | Admitting: Internal Medicine

## 2019-03-03 ENCOUNTER — Telehealth: Payer: Self-pay

## 2019-03-03 DIAGNOSIS — E7800 Pure hypercholesterolemia, unspecified: Secondary | ICD-10-CM

## 2019-03-03 DIAGNOSIS — E039 Hypothyroidism, unspecified: Secondary | ICD-10-CM

## 2019-03-03 NOTE — Telephone Encounter (Signed)
Orders placed for labs

## 2019-03-03 NOTE — Telephone Encounter (Signed)
Please place future orders for patient's upcoming appointment. Thanks

## 2019-03-05 ENCOUNTER — Other Ambulatory Visit: Payer: Self-pay

## 2019-03-09 ENCOUNTER — Other Ambulatory Visit (INDEPENDENT_AMBULATORY_CARE_PROVIDER_SITE_OTHER): Payer: Medicare HMO

## 2019-03-09 ENCOUNTER — Other Ambulatory Visit: Payer: Self-pay

## 2019-03-09 DIAGNOSIS — E7800 Pure hypercholesterolemia, unspecified: Secondary | ICD-10-CM | POA: Diagnosis not present

## 2019-03-09 DIAGNOSIS — E039 Hypothyroidism, unspecified: Secondary | ICD-10-CM

## 2019-03-09 LAB — HEPATIC FUNCTION PANEL
ALT: 18 U/L (ref 0–35)
AST: 23 U/L (ref 0–37)
Albumin: 3.7 g/dL (ref 3.5–5.2)
Alkaline Phosphatase: 71 U/L (ref 39–117)
Bilirubin, Direct: 0.1 mg/dL (ref 0.0–0.3)
Total Bilirubin: 0.3 mg/dL (ref 0.2–1.2)
Total Protein: 6.7 g/dL (ref 6.0–8.3)

## 2019-03-09 LAB — TSH: TSH: 1.56 u[IU]/mL (ref 0.35–4.50)

## 2019-03-09 LAB — BASIC METABOLIC PANEL
BUN: 16 mg/dL (ref 6–23)
CO2: 26 mEq/L (ref 19–32)
Calcium: 8.7 mg/dL (ref 8.4–10.5)
Chloride: 109 mEq/L (ref 96–112)
Creatinine, Ser: 1.06 mg/dL (ref 0.40–1.20)
GFR: 61.71 mL/min (ref >60.00)
Glucose, Bld: 92 mg/dL (ref 70–99)
Potassium: 4.6 mEq/L (ref 3.5–5.1)
Sodium: 141 mEq/L (ref 135–145)

## 2019-03-09 LAB — LIPID PANEL
Cholesterol: 152 mg/dL (ref 0–200)
HDL: 74.5 mg/dL (ref >39.00)
LDL Cholesterol: 64 mg/dL (ref 0–99)
NonHDL: 77.77
Total CHOL/HDL Ratio: 2
Triglycerides: 70 mg/dL (ref 0.0–149.0)
VLDL: 14 mg/dL (ref 0.0–40.0)

## 2019-03-11 ENCOUNTER — Other Ambulatory Visit: Payer: Self-pay

## 2019-03-11 ENCOUNTER — Ambulatory Visit (INDEPENDENT_AMBULATORY_CARE_PROVIDER_SITE_OTHER): Payer: Medicare HMO | Admitting: Internal Medicine

## 2019-03-11 VITALS — BP 128/70 | HR 63 | Temp 98.3°F | Resp 16 | Ht 63.0 in | Wt 133.0 lb

## 2019-03-11 DIAGNOSIS — E039 Hypothyroidism, unspecified: Secondary | ICD-10-CM

## 2019-03-11 DIAGNOSIS — F17200 Nicotine dependence, unspecified, uncomplicated: Secondary | ICD-10-CM

## 2019-03-11 DIAGNOSIS — Z1231 Encounter for screening mammogram for malignant neoplasm of breast: Secondary | ICD-10-CM

## 2019-03-11 DIAGNOSIS — I10 Essential (primary) hypertension: Secondary | ICD-10-CM

## 2019-03-11 DIAGNOSIS — D649 Anemia, unspecified: Secondary | ICD-10-CM

## 2019-03-11 DIAGNOSIS — E7800 Pure hypercholesterolemia, unspecified: Secondary | ICD-10-CM | POA: Diagnosis not present

## 2019-03-11 DIAGNOSIS — R69 Illness, unspecified: Secondary | ICD-10-CM | POA: Diagnosis not present

## 2019-03-11 DIAGNOSIS — Z0000 Encounter for general adult medical examination without abnormal findings: Secondary | ICD-10-CM

## 2019-03-11 NOTE — Progress Notes (Signed)
[name], female   DOB: 11/22/1945, 73 y.o.   MRN: [id number]   Subjective:    [name], female    DOB: 1945/10/07, 73 y.o.   MRN: [id number]  HPI  Patient here for her physical exam.  She reports she is doing well.  Feels good.  Stays active.  No chest pain.  No sob.  No acid reflux.  No abdominal pain.  Bowels moving.  Trying to stay in due to covid restrictions.  Blood pressure doing well.  Discussed the need for colon screening.  Discussed immunizations.  She declines all immunizations.     Past Medical History:  Diagnosis Date  . GERD (gastroesophageal reflux disease)   . Goiter    s/p thyroidectomy  . H/O syncope   . Hypercholesterolemia   . Hypothyroidism   . Recurrent sinus infections    and allergy problems   Past Surgical History:  Procedure Laterality Date  . COLONOSCOPY WITH PROPOFOL N/A 11/03/2014   Procedure: COLONOSCOPY WITH PROPOFOL;  Surgeon: Manya Silvas, MD;  Location: [facility] ENDOSCOPY;  Service: Endoscopy;  Laterality: N/A;  . COLONOSCOPY WITH PROPOFOL N/A 02/12/2018   Procedure: COLONOSCOPY WITH PROPOFOL;  Surgeon: Manya Silvas, MD;  Location: [facility] ENDOSCOPY;  Service: Endoscopy;  Laterality: N/A;  . [place]  . ESOPHAGOGASTRODUODENOSCOPY N/A 11/03/2014   Procedure: ESOPHAGOGASTRODUODENOSCOPY (EGD);  Surgeon: Manya Silvas, MD;  Location: [facility] ENDOSCOPY;  Service: Endoscopy;  Laterality: N/A;  . LAPAROSCOPIC APPENDECTOMY N/A 11/26/2014   Procedure: APPENDECTOMY LAPAROSCOPIC;  Surgeon: Robert Bellow, MD;  Location: [facility] ORS;  Service: General;  Laterality: N/A;  . thyroidectomy  1979   goiter  . TUBAL LIGATION  1975   Family History  Problem Relation Age of Onset  . Tuberculosis Paternal Grandfather   . Alzheimer's disease Mother   . Goiter Mother    Social History   Socioeconomic History  . Marital status: Married    Spouse name: Not on file  . Number of children: Not  on file  . Years of education: Not on file  . Highest education level: Not on file  Occupational History  . Not on file  Social Needs  . Financial resource strain: Not on file  . Food insecurity    Worry: Not on file    Inability: Not on file  . Transportation needs    Medical: Not on file    Non-medical: Not on file  Tobacco Use  . Smoking status: Former Smoker    Types: Cigarettes    Quit date: 04/16/2009    Years since quitting: 9.9  . Smokeless tobacco: Never Used  Substance and Sexual Activity  . Alcohol use: Yes    Alcohol/week: 3.0 - 4.0 standard drinks    Types: 3 - 4 Standard drinks or equivalent per week    Comment: occ  . Drug use: Yes    Types: Marijuana    Comment: marijuana every day  . Sexual activity: Not on file  Lifestyle  . Physical activity    Days per week: Not on file    Minutes per session: Not on file  . Stress: Not on file  Relationships  . Social Herbalist on phone: Not on file    Gets together: Not on file    Attends religious service: Not on file    Active member of club or organization: Not on file  Attends meetings of clubs or organizations: Not on file    Relationship status: Not on file  Other Topics Concern  . Not on file  Social History Narrative  . Not on file    Outpatient Encounter Medications as of 03/11/2019  Medication Sig  . Cyanocobalamin (VITAMIN B 12 PO) Take by mouth.  . levothyroxine (SYNTHROID) 50 MCG tablet TAKE 1 TABLET BY MOUTH EVERY DAY  . lisinopril (ZESTRIL) 40 MG tablet TAKE 1 TABLET BY MOUTH EVERY DAY  . rosuvastatin (CRESTOR) 10 MG tablet TAKE 1 TABLET BY MOUTH EVERY DAY   No facility-administered encounter medications on file as of 03/11/2019.    Review of Systems  Constitutional: Negative for appetite change and unexpected weight change.  HENT: Negative for congestion and sinus pressure.   Eyes: Negative for pain and visual disturbance.  Respiratory: Negative for cough, chest tightness and  shortness of breath.   Cardiovascular: Negative for chest pain, palpitations and leg swelling.  Gastrointestinal: Negative for abdominal pain, diarrhea, nausea and vomiting.  Genitourinary: Negative for difficulty urinating and dysuria.  Musculoskeletal: Negative for joint swelling and myalgias.  Skin: Negative for color change and rash.  Neurological: Negative for dizziness, light-headedness and headaches.  Hematological: Negative for adenopathy. Does not bruise/bleed easily.  Psychiatric/Behavioral: Negative for agitation and dysphoric mood.       Objective:    Physical Exam Constitutional:      General: She is not in acute distress.    Appearance: Normal appearance. She is well-developed.  HENT:     Head: Normocephalic and atraumatic.     Right Ear: External ear normal.     Left Ear: External ear normal.  Eyes:     General: No scleral icterus.       Right eye: No discharge.        Left eye: No discharge.     Conjunctiva/sclera: Conjunctivae normal.  Neck:     Musculoskeletal: Neck supple. No muscular tenderness.     Thyroid: No thyromegaly.  Cardiovascular:     Rate and Rhythm: Normal rate and regular rhythm.  Pulmonary:     Effort: No tachypnea, accessory muscle usage or respiratory distress.     Breath sounds: Normal breath sounds. No decreased breath sounds or wheezing.  Chest:     Breasts:        Right: No inverted nipple, mass, nipple discharge or tenderness (no axillary adenopathy).        Left: No inverted nipple, mass, nipple discharge or tenderness (no axilarry adenopathy).  Abdominal:     General: Bowel sounds are normal.     Palpations: Abdomen is soft.     Tenderness: There is no abdominal tenderness.  Musculoskeletal:        General: No swelling or tenderness.  Lymphadenopathy:     Cervical: No cervical adenopathy.  Skin:    Findings: No erythema or rash.  Neurological:     Mental Status: She is alert and oriented to person, place, and time.   Psychiatric:        Mood and Affect: Mood normal.        Behavior: Behavior normal.     BP 128/70   Pulse 63   Temp 98.3 F (36.8 C)   Resp 16   Ht 5\' 3"  (1.6 m)   Wt 133 lb (60.3 kg)   SpO2 99%   BMI 23.56 kg/m  Wt Readings from Last 3 Encounters:  03/11/19 133 lb (60.3 kg)  04/24/18 124 lb  12.8 oz (56.6 kg)  02/18/18 125 lb 9.6 oz (57 kg)     Lab Results  Component Value Date   WBC 5.9 08/20/2018   HGB 12.5 08/20/2018   HCT 36.7 08/20/2018   PLT 193.0 08/20/2018   GLUCOSE 92 03/09/2019   CHOL 152 03/09/2019   TRIG 70.0 03/09/2019   HDL 74.50 03/09/2019   LDLCALC 64 03/09/2019   ALT 18 03/09/2019   AST 23 03/09/2019   NA 141 03/09/2019   K 4.6 03/09/2019   CL 109 03/09/2019   CREATININE 1.06 03/09/2019   BUN 16 03/09/2019   CO2 26 03/09/2019   TSH 1.56 03/09/2019    Mm 3d Screen Breast Bilateral  Result Date: 03/24/2018 CLINICAL DATA:  Screening. EXAM: DIGITAL SCREENING BILATERAL MAMMOGRAM WITH TOMO AND CAD COMPARISON:  Previous exam(s). ACR Breast Density Category d: The breast tissue is extremely dense, which lowers the sensitivity of mammography FINDINGS: There are no findings suspicious for malignancy. Images were processed with CAD. IMPRESSION: No mammographic evidence of malignancy. A result letter of this screening mammogram will be mailed directly to the patient. RECOMMENDATION: Screening mammogram in one year. (Code:SM-B-01Y) BI-RADS CATEGORY  1: Negative. Electronically Signed   By: Franki Cabot M.D.   On: 03/24/2018 16:11       Assessment & Plan:   Problem List Items Addressed This Visit    Anemia    Follow cbc.       Essential hypertension, benign    Blood pressure under good control.  Continue same medication regimen.  Follow pressures.  Follow metabolic panel.        Relevant Orders   CBC with Differential/Platelet   Basic metabolic panel (future)   Health care maintenance    Physical today 03/11/19.  PAP 06/25/16 - negative with  negative HPV.  Mammogram 03/24/18 - Birads I.  Order placed for mammogram.  She will schedule.  Declines immunizations.  Colonoscopy 02/12/18.  Due f/u colonoscopy. Order placed for referral.        Hypercholesterolemia    On crestor.  Low cholesterol diet and exercise.  Follow lipid panel and liver function tests.        Relevant Orders   Hepatic function panel   Lipid panel   Hypothyroidism    On thyroid replacement.  Follow tsh.       Smoking    Smokes marijuana occasionally.  Does not smoke cigarettes.  Discussed the need to not smoke.  Follow.         Other Visit Diagnoses    Visit for screening mammogram    -  Primary   Relevant Orders   MM 3D SCREEN BREAST BILATERAL       Einar Pheasant, MD

## 2019-03-12 ENCOUNTER — Encounter: Payer: Self-pay | Admitting: Internal Medicine

## 2019-03-12 NOTE — Assessment & Plan Note (Signed)
On thyroid replacement.  Follow tsh.  

## 2019-03-12 NOTE — Assessment & Plan Note (Signed)
Follow cbc.  

## 2019-03-12 NOTE — Assessment & Plan Note (Signed)
Physical today 03/11/19.  PAP 06/25/16 - negative with negative HPV.  Mammogram 03/24/18 - Birads I.  Order placed for mammogram.  She will schedule.  Declines immunizations.  Colonoscopy 02/12/18.  Due f/u colonoscopy. Order placed for referral.

## 2019-03-12 NOTE — Assessment & Plan Note (Signed)
Smokes marijuana occasionally.  Does not smoke cigarettes.  Discussed the need to not smoke.  Follow.

## 2019-03-12 NOTE — Assessment & Plan Note (Signed)
Blood pressure under good control.  Continue same medication regimen.  Follow pressures.  Follow metabolic panel.   

## 2019-03-12 NOTE — Assessment & Plan Note (Signed)
On crestor.  Low cholesterol diet and exercise.  Follow lipid panel and liver function tests.   

## 2019-03-18 ENCOUNTER — Ambulatory Visit (INDEPENDENT_AMBULATORY_CARE_PROVIDER_SITE_OTHER): Payer: Medicare HMO

## 2019-03-18 ENCOUNTER — Other Ambulatory Visit: Payer: Self-pay

## 2019-03-18 DIAGNOSIS — Z0000 Encounter for general adult medical examination without abnormal findings: Secondary | ICD-10-CM | POA: Diagnosis not present

## 2019-03-18 NOTE — Patient Instructions (Addendum)
  Jessica Wall , Thank you for taking time to come for your Medicare Wellness Visit. I appreciate your ongoing commitment to your health goals. Please review the following plan we discussed and let me know if I can assist you in the future.   These are the goals we discussed: Goals    . Follow up with Primary Care Provider     As needed       This is a list of the screening recommended for you and due dates:  Health Maintenance  Topic Date Due  .  Hepatitis C: One time screening is recommended by Center for Disease Control  (CDC) for  adults born from [birth year] through 1963.   03/17/2020*  . DEXA scan (bone density measurement)  05/19/2024*  . Tetanus Vaccine  05/19/2024*  . Mammogram  03/25/2019  . Colon Cancer Screening  02/12/2021  . Flu Shot  Discontinued  . Pneumonia vaccines  Discontinued  *Topic was postponed. The date shown is not the original due date.

## 2019-03-18 NOTE — Progress Notes (Addendum)
Subjective:   Jessica Wall is a 73 y.o. female who presents for an Initial Medicare Annual Wellness Visit.  Review of Systems    No ROS.  Medicare Wellness Virtual Visit.  Visual/audio telehealth visit, UTA vital signs.   See social history for additional risk factors.    Cardiac Risk Factors include: advanced age (>68men, >82 women);hypertension     Objective:    Today's Vitals   There is no height or weight on file to calculate BMI.  Advanced Directives 03/18/2019 02/12/2018 11/26/2014  Does Patient Have a Medical Advance Directive? No No No  Would patient like information on creating a medical advance directive? Yes (MAU/Ambulatory/Procedural Areas - Information given) Yes (MAU/Ambulatory/Procedural Areas - Information given) No - patient declined information    Current Medications (verified) Outpatient Encounter Medications as of 03/18/2019  Medication Sig  . Cyanocobalamin (VITAMIN B 12 PO) Take by mouth.  . levothyroxine (SYNTHROID) 50 MCG tablet TAKE 1 TABLET BY MOUTH EVERY DAY  . lisinopril (ZESTRIL) 40 MG tablet TAKE 1 TABLET BY MOUTH EVERY DAY  . rosuvastatin (CRESTOR) 10 MG tablet TAKE 1 TABLET BY MOUTH EVERY DAY   No facility-administered encounter medications on file as of 03/18/2019.     Allergies (verified) Patient has no known allergies.   History: Past Medical History:  Diagnosis Date  . GERD (gastroesophageal reflux disease)   . Goiter    s/p thyroidectomy  . H/O syncope   . Hypercholesterolemia   . Hypothyroidism   . Recurrent sinus infections    and allergy problems   Past Surgical History:  Procedure Laterality Date  . COLONOSCOPY WITH PROPOFOL N/A 11/03/2014   Procedure: COLONOSCOPY WITH PROPOFOL;  Surgeon: Manya Silvas, MD;  Location: [facility] ENDOSCOPY;  Service: Endoscopy;  Laterality: N/A;  . COLONOSCOPY WITH PROPOFOL N/A 02/12/2018   Procedure: COLONOSCOPY WITH PROPOFOL;  Surgeon: Manya Silvas, MD;  Location: [facility] ENDOSCOPY;   Service: Endoscopy;  Laterality: N/A;  . [place]  . ESOPHAGOGASTRODUODENOSCOPY N/A 11/03/2014   Procedure: ESOPHAGOGASTRODUODENOSCOPY (EGD);  Surgeon: Manya Silvas, MD;  Location: [facility] ENDOSCOPY;  Service: Endoscopy;  Laterality: N/A;  . LAPAROSCOPIC APPENDECTOMY N/A 11/26/2014   Procedure: APPENDECTOMY LAPAROSCOPIC;  Surgeon: Robert Bellow, MD;  Location: [facility] ORS;  Service: General;  Laterality: N/A;  . thyroidectomy  1979   goiter  . TUBAL LIGATION  1975   Family History  Problem Relation Age of Onset  . Tuberculosis Paternal Grandfather   . Alzheimer's disease Mother   . Goiter Mother   . Cancer Brother   . Cancer Brother   . Cancer Brother   . Cancer Brother    Social History   Socioeconomic History  . Marital status: Married    Spouse name: Not on file  . Number of children: Not on file  . Years of education: Not on file  . Highest education level: Not on file  Occupational History  . Not on file  Social Needs  . Financial resource strain: Not hard at all  . Food insecurity    Worry: Never true    Inability: Never true  . Transportation needs    Medical: No    Non-medical: No  Tobacco Use  . Smoking status: Former Smoker    Types: Cigarettes    Quit date: 04/16/2009    Years since quitting: 9.9  . Smokeless tobacco: Never Used  Substance and Sexual Activity  . Alcohol use: Yes  Alcohol/week: 3.0 - 4.0 standard drinks    Types: 3 - 4 Standard drinks or equivalent per week    Comment: occ  . Drug use: Yes    Types: Marijuana    Comment: marijuana every day  . Sexual activity: Not on file  Lifestyle  . Physical activity    Days per week: 3 days    Minutes per session: 30 min  . Stress: Not at all  Relationships  . Social Herbalist on phone: Not on file    Gets together: Not on file    Attends religious service: Not on file    Active member of club or organization: Not on file    Attends meetings  of clubs or organizations: Not on file    Relationship status: Not on file  Other Topics Concern  . Not on file  Social History Narrative  . Not on file    Tobacco Counseling Counseling given: Not Answered   Clinical Intake:  Pre-visit preparation completed: Yes        Diabetes: No  How often do you need to have someone help you when you read instructions, pamphlets, or other written materials from your doctor or pharmacy?: 1 - Never  Interpreter Needed?: No      Activities of Daily Living In your present state of health, do you have any difficulty performing the following activities: 03/18/2019  Hearing? N  Vision? N  Difficulty concentrating or making decisions? N  Walking or climbing stairs? N  Dressing or bathing? N  Doing errands, shopping? N  Preparing Food and eating ? N  Using the Toilet? N  In the past six months, have you accidently leaked urine? N  Do you have problems with loss of bowel control? N  Managing your Medications? N  Managing your Finances? N  Housekeeping or managing your Housekeeping? N  Some recent data might be hidden     Immunizations and Health Maintenance  There is no immunization history on file for this patient. There are no preventive care reminders to display for this patient.  Patient Care Team: Einar Pheasant, MD as PCP - General (Internal Medicine) Einar Pheasant, MD (Internal Medicine) Bary Castilla Forest Gleason, MD (General Surgery)  Indicate any recent Medical Services you may have received from other than [facility] providers in the past year (date may be approximate).     Assessment:   This is a routine wellness examination for [place].  Nurse connected with patient 03/18/19 at  9:30 AM EST by a telephone enabled telemedicine application and verified that I am speaking with the correct person using two identifiers. Patient stated full name and DOB. Patient gave permission to continue with virtual visit. Patient's location  was at home and Nurse's location was at [place] office.   Health Maintenance Due: -Mammogram- ordered -Hepatitis C Screening- postponed per patient preference Update all pending maintenance due as appropriate.   See completed HM at the end of note.   Eye: Visual acuity not assessed. Virtual visit. Wears corrective lenses. Followed by their ophthalmologist every 12 months.   Dental: Visits every 6 months.    Hearing: Demonstrates normal hearing during visit.  Safety:  Patient feels safe at home- yes Patient does have smoke detectors at home- yes Patient does wear sunscreen or protective clothing when in direct sunlight - yes Patient does wear seat belt when in a moving vehicle - yes Patient drives- yes Adequate lighting in walkways free from debris- yes Grab  bars and handrails used as appropriate- yes Ambulates with no assistive device Cell phone on person when ambulating outside of the home- yes  Social: Alcohol intake - yes      Smoking history- former    Smokers in home? none Substance use? none  Depression: PHQ 2 &9 complete. See screening below. Denies irritability, anhedonia, sadness/tearfullness.    Falls: See screening below.    Medication: Taking as directed and without issues.   Covid-19: Precautions and sickness symptoms discussed. Wears mask, social distancing, hand hygiene as appropriate.   Activities of Daily Living Patient denies needing assistance with: household chores, feeding themselves, getting from bed to chair, getting to the toilet, bathing/showering, dressing, managing money, or preparing meals.   Memory: Patient is alert. Patient denies difficulty focusing or concentrating. Correctly identified the president of the [place], season and recall. Patient likes to read, play computer games, complete puzzles for brain stimulation.   BMI- discussed the importance of a healthy diet, water intake and the benefits of aerobic exercise.  Educational  material provided.  Physical activity- active around the home and in the community, no routine.  Diet:  Low Cholesterol Water: good intake  Other Providers Patient Care Team: Einar Pheasant, MD as PCP - General (Internal Medicine) Einar Pheasant, MD (Internal Medicine) Bary Castilla Forest Gleason, MD (General Surgery)  Hearing/Vision screen  Hearing Screening   125Hz  250Hz  500Hz  1000Hz  2000Hz  3000Hz  4000Hz  6000Hz  8000Hz   Right ear:           Left ear:           Comments: Patient is able to hear conversational tones without difficulty.  No issues reported.  Vision Screening Comments: Visual acuity not assessed, virtual visit.  They have seen their ophthalmologist in the last 12 months.     Dietary issues and exercise activities discussed: Current Exercise Habits: Home exercise routine, Type of exercise: walking, Intensity: Mild  Goals    . Follow up with Primary Care Provider     As needed      Depression Screen PHQ 2/9 Scores 03/18/2019 08/27/2018 03/20/2016 02/01/2016 05/24/2015 05/19/2014 09/04/2012  PHQ - 2 Score 0 0 0 0 0 0 0    Fall Risk Fall Risk  03/18/2019 03/20/2016 02/01/2016 05/24/2015 05/19/2014  Falls in the past year? 0 No No No No  Follow up Education provided;Falls prevention discussed - - - -   Timed Get Up and Go Performed no, virtual visit  Cognitive Function:     6CIT Screen 03/18/2019  What Year? 0 points  What month? 0 points  What time? 0 points  Count back from 20 0 points  Months in reverse 0 points  Repeat phrase 0 points  Total Score 0    Screening Tests Health Maintenance  Topic Date Due  . Hepatitis C Screening  03/17/2020 (Originally May 19, 1945)  . DEXA SCAN  05/19/2024 (Originally 06/27/2012)  . TETANUS/TDAP  05/19/2024 (Originally 06/28/1966)  . MAMMOGRAM  03/25/2019  . COLONOSCOPY  02/12/2021  . INFLUENZA VACCINE  Discontinued  . PNA vac Low Risk Adult  Discontinued     Plan:   Keep all routine maintenance appointments.   Next scheduled  lab 09/03/19 @ 8:15  Follow up 09/08/19  Medicare Attestation I have personally reviewed: The patient's medical and social history Their use of alcohol, tobacco or illicit drugs Their current medications and supplements The patient's functional ability including ADLs,fall risks, home safety risks, cognitive, and hearing and visual impairment Diet and physical activities Evidence for  depression   In addition, I have reviewed and discussed with patient certain preventive protocols, quality metrics, and best practice recommendations. A written personalized care plan for preventive services as well as general preventive health recommendations were provided to patient via mail.     Varney Biles, LPN   [id number]    Reviewed above information.  Agree with assessment and plan.    Dr Nicki Reaper

## 2019-03-27 ENCOUNTER — Encounter: Payer: Self-pay | Admitting: Internal Medicine

## 2019-04-20 IMAGING — MG DIGITAL SCREENING BILATERAL MAMMOGRAM WITH TOMO AND CAD
8 series · 9 of 24 positions shown · non-contrast
Comparison: Previous exam(s).

CLINICAL DATA: Screening.

EXAM:
DIGITAL SCREENING BILATERAL MAMMOGRAM WITH TOMO AND CAD

[L CC synth-2D]
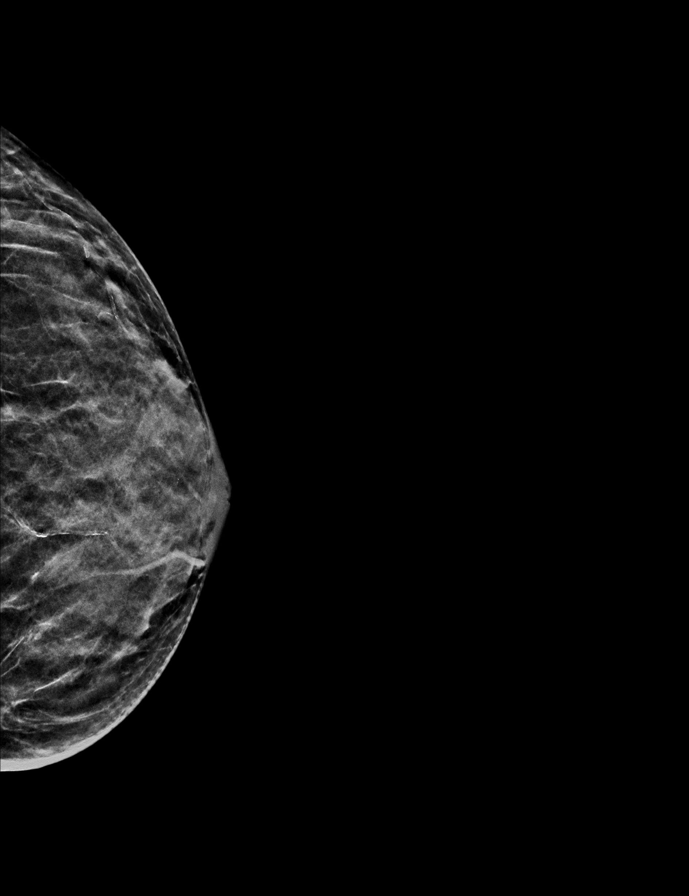

[R MLO synth-2D]
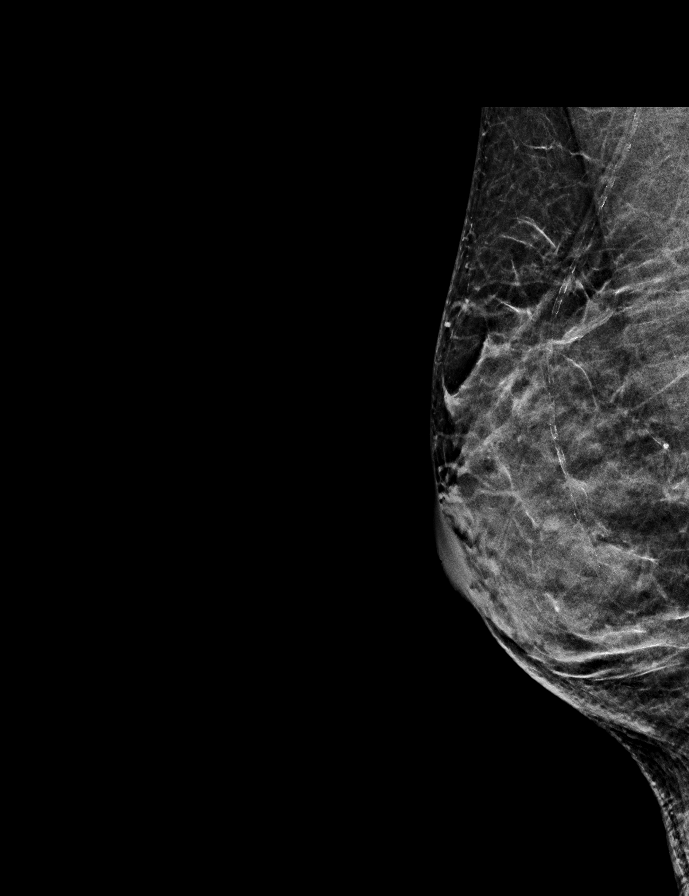

[R CC synth-2D]
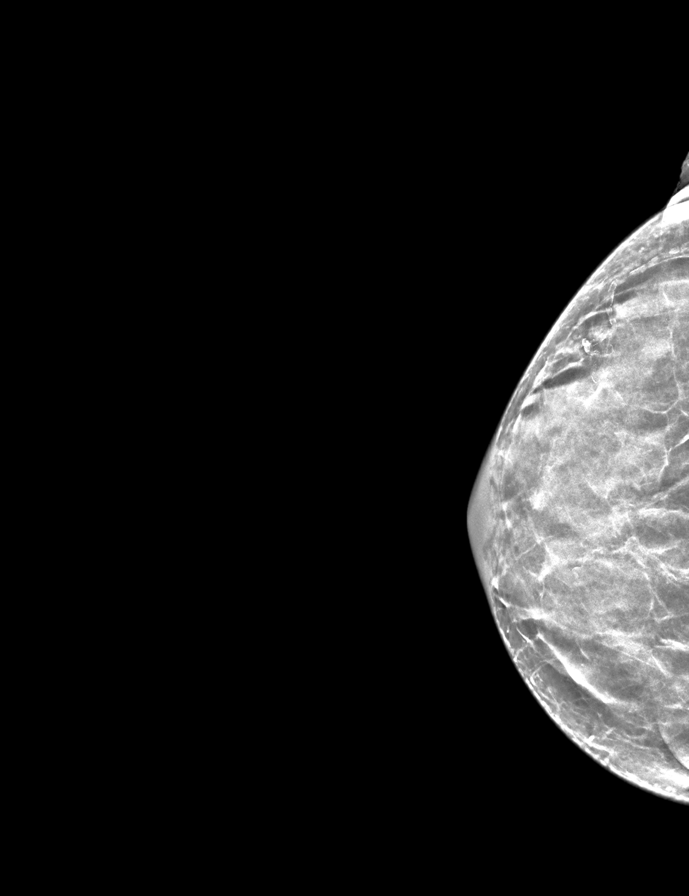

[L MLO synth-2D]
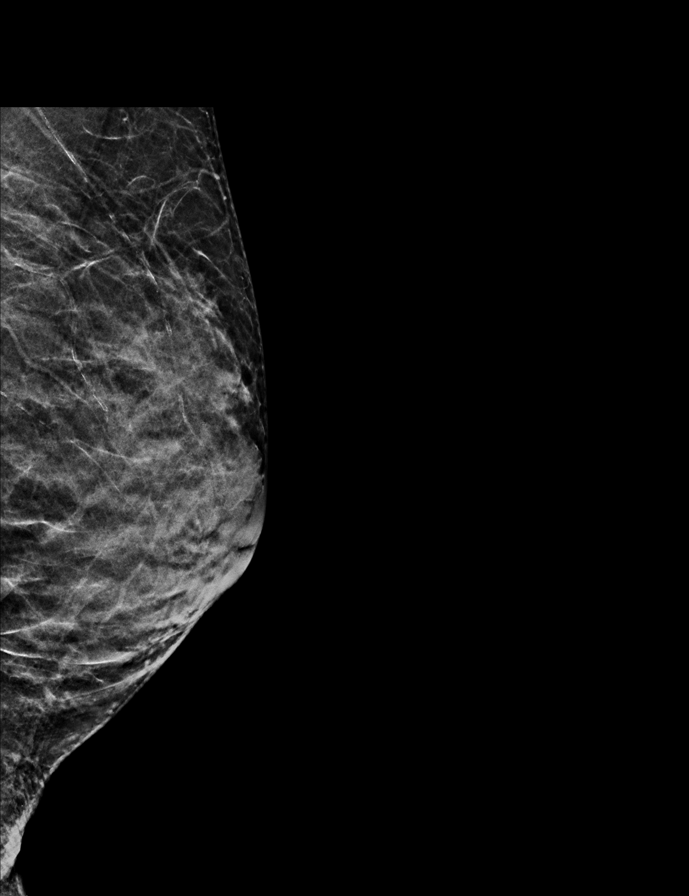

[R MLO tomo · 2 of 40 frames shown]
[frame 13/40]
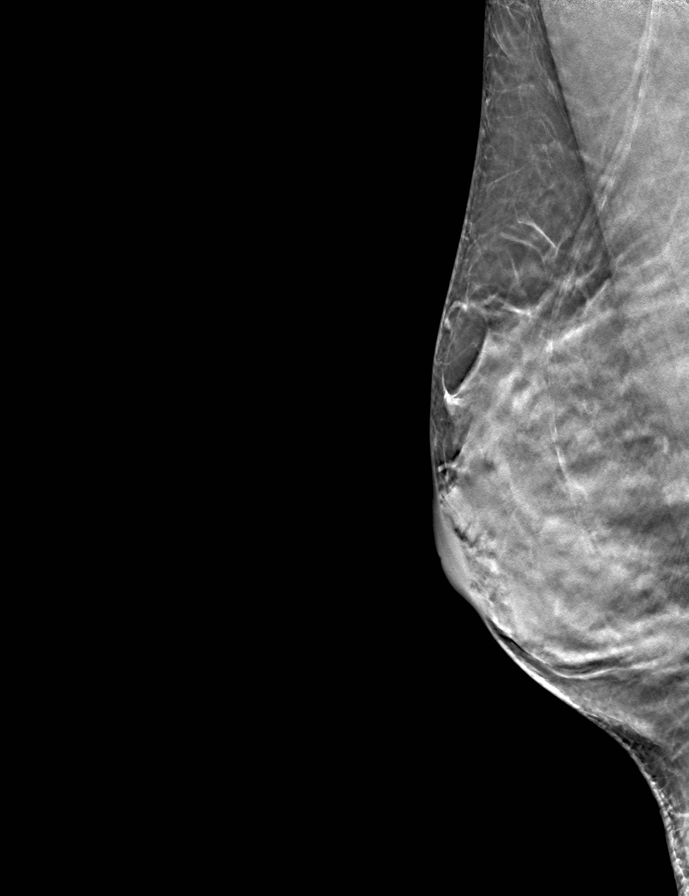
[frame 21/40]
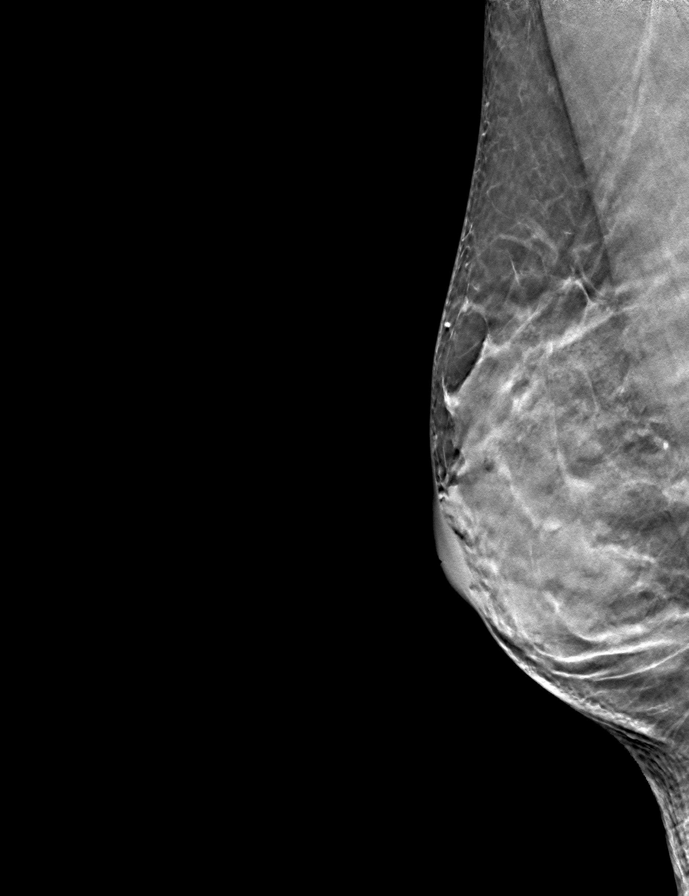

[L MLO tomo · tomo slice 19/37.0]
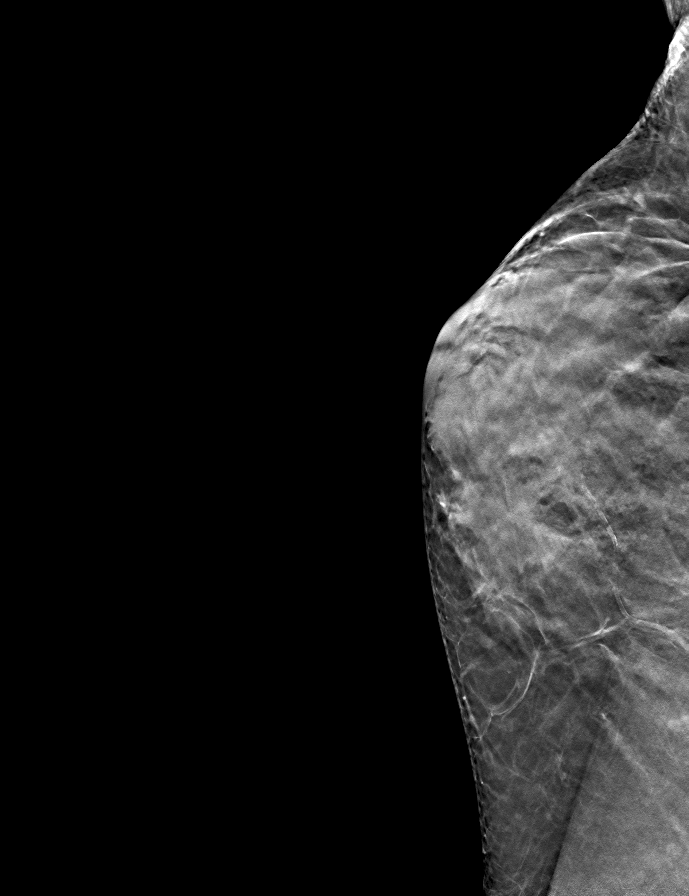

[L CC tomo · tomo slice 19/38.0]
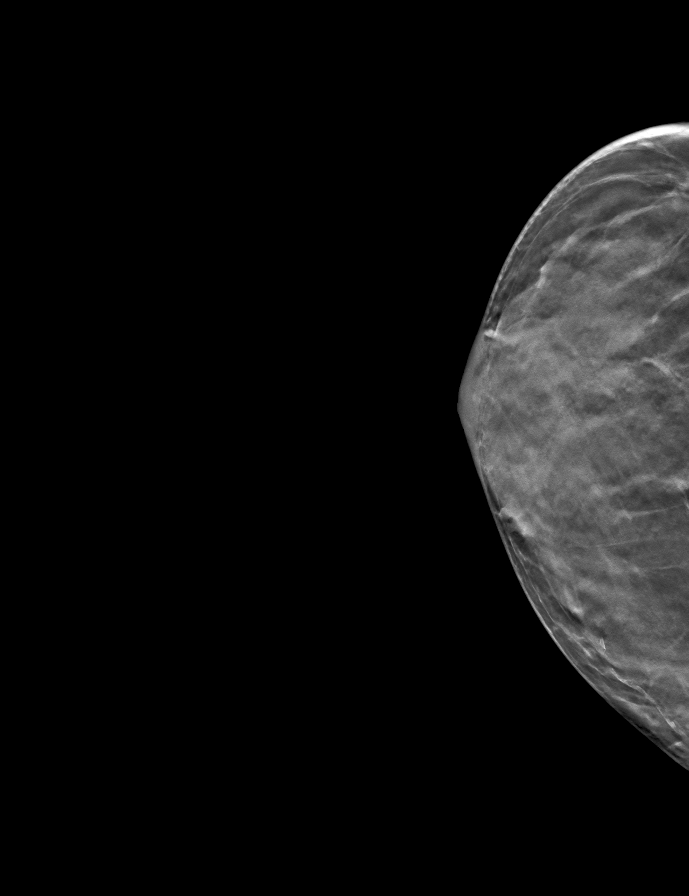

[R CC tomo · tomo slice 23/45.0]
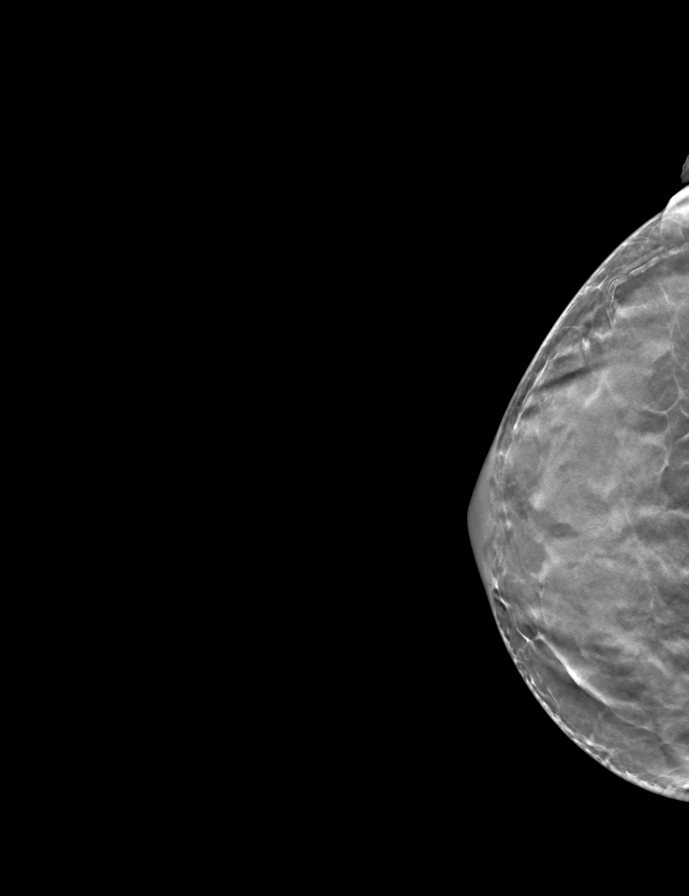

[9 of 24 positions shown; findings below may reference images not displayed]

ACR Breast Density Category d: The breast tissue is extremely dense,
which lowers the sensitivity of mammography
FINDINGS: There are no findings suspicious for malignancy. Images were
processed with CAD.
IMPRESSION: No mammographic evidence of malignancy. A result letter of this
screening mammogram will be mailed directly to the patient.

RECOMMENDATION:
Screening mammogram in one year. (Code:WO-[id number])

BI-RADS CATEGORY  1: Negative.

## 2019-08-19 ENCOUNTER — Other Ambulatory Visit: Payer: Self-pay | Admitting: Internal Medicine

## 2019-09-03 ENCOUNTER — Other Ambulatory Visit: Payer: Self-pay

## 2019-09-03 ENCOUNTER — Other Ambulatory Visit (INDEPENDENT_AMBULATORY_CARE_PROVIDER_SITE_OTHER): Payer: Medicare Other

## 2019-09-03 DIAGNOSIS — I10 Essential (primary) hypertension: Secondary | ICD-10-CM | POA: Diagnosis not present

## 2019-09-03 DIAGNOSIS — E7800 Pure hypercholesterolemia, unspecified: Secondary | ICD-10-CM | POA: Diagnosis not present

## 2019-09-03 LAB — CBC WITH DIFFERENTIAL/PLATELET
Basophils Absolute: 0 10*3/uL (ref 0.0–0.1)
Basophils Relative: 0.6 % (ref 0.0–3.0)
Eosinophils Absolute: 0.1 10*3/uL (ref 0.0–0.7)
Eosinophils Relative: 2.3 % (ref 0.0–5.0)
HCT: 38.9 % (ref 36.0–46.0)
Hemoglobin: 13.1 g/dL (ref 12.0–15.0)
Lymphocytes Relative: 50.1 % — ABNORMAL HIGH (ref 12.0–46.0)
Lymphs Abs: 3.1 10*3/uL (ref 0.7–4.0)
MCHC: 33.6 g/dL (ref 30.0–36.0)
MCV: 98.6 fl (ref 78.0–100.0)
Monocytes Absolute: 0.4 10*3/uL (ref 0.1–1.0)
Monocytes Relative: 5.9 % (ref 3.0–12.0)
Neutro Abs: 2.5 10*3/uL (ref 1.4–7.7)
Neutrophils Relative %: 41.1 % — ABNORMAL LOW (ref 43.0–77.0)
Platelets: 213 10*3/uL (ref 150.0–400.0)
RBC: 3.94 Mil/uL (ref 3.87–5.11)
RDW: 14.9 % (ref 11.5–15.5)
WBC: 6.2 10*3/uL (ref 4.0–10.5)

## 2019-09-03 LAB — LIPID PANEL
Cholesterol: 159 mg/dL (ref 0–200)
HDL: 72.6 mg/dL (ref >39.00)
LDL Cholesterol: 68 mg/dL (ref 0–99)
NonHDL: 86.51
Total CHOL/HDL Ratio: 2
Triglycerides: 94 mg/dL (ref 0.0–149.0)
VLDL: 18.8 mg/dL (ref 0.0–40.0)

## 2019-09-03 LAB — BASIC METABOLIC PANEL
BUN: 26 mg/dL — ABNORMAL HIGH (ref 6–23)
CO2: 27 mEq/L (ref 19–32)
Calcium: 9.5 mg/dL (ref 8.4–10.5)
Chloride: 106 mEq/L (ref 96–112)
Creatinine, Ser: 1.18 mg/dL (ref 0.40–1.20)
GFR: 54.45 mL/min — ABNORMAL LOW (ref >60.00)
Glucose, Bld: 93 mg/dL (ref 70–99)
Potassium: 4.3 mEq/L (ref 3.5–5.1)
Sodium: 137 mEq/L (ref 135–145)

## 2019-09-03 LAB — HEPATIC FUNCTION PANEL
ALT: 23 U/L (ref 0–35)
AST: 29 U/L (ref 0–37)
Albumin: 4.3 g/dL (ref 3.5–5.2)
Alkaline Phosphatase: 73 U/L (ref 39–117)
Bilirubin, Direct: 0 mg/dL (ref 0.0–0.3)
Total Bilirubin: 0.4 mg/dL (ref 0.2–1.2)
Total Protein: 7.1 g/dL (ref 6.0–8.3)

## 2019-09-08 ENCOUNTER — Ambulatory Visit (INDEPENDENT_AMBULATORY_CARE_PROVIDER_SITE_OTHER): Payer: Medicare Other | Admitting: Internal Medicine

## 2019-09-08 ENCOUNTER — Other Ambulatory Visit: Payer: Self-pay

## 2019-09-08 ENCOUNTER — Encounter: Payer: Self-pay | Admitting: Internal Medicine

## 2019-09-08 DIAGNOSIS — Z86010 Personal history of colonic polyps: Secondary | ICD-10-CM

## 2019-09-08 DIAGNOSIS — E039 Hypothyroidism, unspecified: Secondary | ICD-10-CM

## 2019-09-08 DIAGNOSIS — I10 Essential (primary) hypertension: Secondary | ICD-10-CM

## 2019-09-08 DIAGNOSIS — E7800 Pure hypercholesterolemia, unspecified: Secondary | ICD-10-CM | POA: Diagnosis not present

## 2019-09-08 MED ORDER — AMLODIPINE BESYLATE 5 MG PO TABS: 5.0000 mg | ORAL_TABLET | Freq: Every day | ORAL | 2 refills | Status: DC

## 2019-09-08 NOTE — Progress Notes (Signed)
[name], female   DOB: August 10, 1945, 74 y.o.   MRN: [id number]   Subjective:    [name], female    DOB: Dec 05, 1945, 74 y.o.   MRN: [id number]  HPI This visit occurred during the SARS-CoV-2 public health emergency.  Safety protocols were in place, including screening questions prior to the visit, additional usage of staff PPE, and extensive cleaning of exam room while observing appropriate contact time as indicated for disinfecting solutions.  Patient here for a scheduled follow up.  Here to follow up regarding her blood pressure, cholesterol and thyroid.  She states she is doing well.  Feels good.  No chest pain or tightness. No sob.  No acid reflux. No abdominal pain.  Bowels moving.  Handling stress.  Taking her medication.  Has cut down significantly on her alcohol intake.  Still smokes marijuana.    Past Medical History:  Diagnosis Date  . GERD (gastroesophageal reflux disease)   . Goiter    s/p thyroidectomy  . H/O syncope   . Hypercholesterolemia   . Hypothyroidism   . Recurrent sinus infections    and allergy problems   Past Surgical History:  Procedure Laterality Date  . COLONOSCOPY WITH PROPOFOL N/A 11/03/2014   Procedure: COLONOSCOPY WITH PROPOFOL;  Surgeon: Manya Silvas, MD;  Location: [facility] ENDOSCOPY;  Service: Endoscopy;  Laterality: N/A;  . COLONOSCOPY WITH PROPOFOL N/A 02/12/2018   Procedure: COLONOSCOPY WITH PROPOFOL;  Surgeon: Manya Silvas, MD;  Location: [facility] ENDOSCOPY;  Service: Endoscopy;  Laterality: N/A;  . [place]  . ESOPHAGOGASTRODUODENOSCOPY N/A 11/03/2014   Procedure: ESOPHAGOGASTRODUODENOSCOPY (EGD);  Surgeon: Manya Silvas, MD;  Location: [facility] ENDOSCOPY;  Service: Endoscopy;  Laterality: N/A;  . LAPAROSCOPIC APPENDECTOMY N/A 11/26/2014   Procedure: APPENDECTOMY LAPAROSCOPIC;  Surgeon: Robert Bellow, MD;  Location: [facility] ORS;  Service: General;  Laterality: N/A;  .  thyroidectomy  1979   goiter  . TUBAL LIGATION  1975   Family History  Problem Relation Age of Onset  . Tuberculosis Paternal Grandfather   . Alzheimer's disease Mother   . Goiter Mother   . Cancer Brother   . Cancer Brother   . Cancer Brother   . Cancer Brother    Social History   Socioeconomic History  . Marital status: Married    Spouse name: Not on file  . Number of children: Not on file  . Years of education: Not on file  . Highest education level: Not on file  Occupational History  . Not on file  Tobacco Use  . Smoking status: Former Smoker    Types: Cigarettes    Quit date: 04/16/2009    Years since quitting: 10.4  . Smokeless tobacco: Never Used  Substance and Sexual Activity  . Alcohol use: Yes    Alcohol/week: 3.0 - 4.0 standard drinks    Types: 3 - 4 Standard drinks or equivalent per week    Comment: occ  . Drug use: Yes    Types: Marijuana    Comment: marijuana every day  . Sexual activity: Not on file  Other Topics Concern  . Not on file  Social History Narrative  . Not on file   Social Determinants of Health   Financial Resource Strain: Low Risk   . Difficulty of Paying Living Expenses: Not hard at all  Food Insecurity: No Food Insecurity  . Worried About Charity fundraiser in the Last  Year: Never true  . Ran Out of Food in the Last Year: Never true  Transportation Needs: No Transportation Needs  . Lack of Transportation (Medical): No  . Lack of Transportation (Non-Medical): No  Physical Activity: Insufficiently Active  . Days of Exercise per Week: 3 days  . Minutes of Exercise per Session: 30 min  Stress: No Stress Concern Present  . Feeling of Stress : Not at all  Social Connections:   . Frequency of Communication with Friends and Family:   . Frequency of Social Gatherings with Friends and Family:   . Attends Religious Services:   . Active Member of Clubs or Organizations:   . Attends Archivist Meetings:   Marland Kitchen Marital Status:       Outpatient Encounter Medications as of 09/08/2019  Medication Sig  . Cyanocobalamin (VITAMIN B 12 PO) Take by mouth.  . levothyroxine (SYNTHROID) 50 MCG tablet TAKE 1 TABLET BY MOUTH EVERY DAY  . lisinopril (ZESTRIL) 40 MG tablet TAKE 1 TABLET BY MOUTH EVERY DAY  . rosuvastatin (CRESTOR) 10 MG tablet TAKE 1 TABLET BY MOUTH EVERY DAY  . amLODipine (NORVASC) 5 MG tablet Take 1 tablet (5 mg total) by mouth daily.   No facility-administered encounter medications on file as of 09/08/2019.    Review of Systems  Constitutional: Negative for appetite change and unexpected weight change.  HENT: Negative for congestion and sinus pressure.   Respiratory: Negative for cough, chest tightness and shortness of breath.   Cardiovascular: Negative for chest pain, palpitations and leg swelling.  Gastrointestinal: Negative for abdominal pain, diarrhea, nausea and vomiting.  Genitourinary: Negative for difficulty urinating and dysuria.  Musculoskeletal: Negative for joint swelling and myalgias.  Skin: Negative for color change and rash.  Neurological: Negative for dizziness, light-headedness and headaches.  Psychiatric/Behavioral: Negative for agitation and dysphoric mood.       Objective:    Physical Exam Vitals reviewed.  Constitutional:      General: She is not in acute distress.    Appearance: Normal appearance.  HENT:     Head: Normocephalic and atraumatic.     Right Ear: External ear normal.     Left Ear: External ear normal.  Eyes:     General:        Right eye: No discharge.        Left eye: No discharge.     Conjunctiva/sclera: Conjunctivae normal.  Neck:     Thyroid: No thyromegaly.  Cardiovascular:     Rate and Rhythm: Normal rate and regular rhythm.  Pulmonary:     Effort: No respiratory distress.     Breath sounds: Normal breath sounds. No wheezing.  Abdominal:     General: Bowel sounds are normal.     Palpations: Abdomen is soft.     Tenderness: There is no abdominal  tenderness.  Musculoskeletal:        General: No swelling or tenderness.     Cervical back: Neck supple. No tenderness.  Lymphadenopathy:     Cervical: No cervical adenopathy.  Skin:    Findings: No erythema or rash.  Neurological:     Mental Status: She is alert.  Psychiatric:        Mood and Affect: Mood normal.        Behavior: Behavior normal.     BP (!) 150/88 (BP Location: Left Arm, Patient Position: Sitting)   Pulse 100   Temp 98 F (36.7 C)   Ht 5' 2.99" (1.6 m)  Wt 127 lb 12.8 oz (58 kg)   SpO2 99%   BMI 22.64 kg/m  Wt Readings from Last 3 Encounters:  09/08/19 127 lb 12.8 oz (58 kg)  03/11/19 133 lb (60.3 kg)  04/24/18 124 lb 12.8 oz (56.6 kg)     Lab Results  Component Value Date   WBC 6.2 09/03/2019   HGB 13.1 09/03/2019   HCT 38.9 09/03/2019   PLT 213.0 09/03/2019   GLUCOSE 93 09/03/2019   CHOL 159 09/03/2019   TRIG 94.0 09/03/2019   HDL 72.60 09/03/2019   LDLCALC 68 09/03/2019   ALT 23 09/03/2019   AST 29 09/03/2019   NA 137 09/03/2019   K 4.3 09/03/2019   CL 106 09/03/2019   CREATININE 1.18 09/03/2019   BUN 26 (H) 09/03/2019   CO2 27 09/03/2019   TSH 1.56 03/09/2019    MM 3D SCREEN BREAST BILATERAL  Result Date: 03/24/2018 CLINICAL DATA:  Screening. EXAM: DIGITAL SCREENING BILATERAL MAMMOGRAM WITH TOMO AND CAD COMPARISON:  Previous exam(s). ACR Breast Density Category d: The breast tissue is extremely dense, which lowers the sensitivity of mammography FINDINGS: There are no findings suspicious for malignancy. Images were processed with CAD. IMPRESSION: No mammographic evidence of malignancy. A result letter of this screening mammogram will be mailed directly to the patient. RECOMMENDATION: Screening mammogram in one year. (Code:SM-B-01Y) BI-RADS CATEGORY  1: Negative. Electronically Signed   By: Franki Cabot M.D.   On: 03/24/2018 16:11       Assessment & Plan:   Problem List Items Addressed This Visit    Essential hypertension, benign     Blood pressure elevated.  Add norvasc 5mg  q day.  Continue lisinopril.  Follow pressures.  Get her back in soon to reassess.  Follow metabolic panel.       Relevant Medications   amLODipine (NORVASC) 5 MG tablet   History of colonic polyps    Colonoscopy 01/2018.  Discussed f/u.  Get blood pressure under better control.        Hypercholesterolemia    On crestor.  Low cholesterol diet and exercise.  Follow lipid panel and liver function tests.        Relevant Medications   amLODipine (NORVASC) 5 MG tablet   Hypothyroidism    On thyroid replacement.  Follow tsh.           Einar Pheasant, MD

## 2019-09-13 ENCOUNTER — Encounter: Payer: Self-pay | Admitting: Internal Medicine

## 2019-09-13 NOTE — Assessment & Plan Note (Signed)
On thyroid replacement.  Follow tsh.  

## 2019-09-13 NOTE — Assessment & Plan Note (Signed)
Blood pressure elevated.  Add norvasc 5mg  q day.  Continue lisinopril.  Follow pressures.  Get her back in soon to reassess.  Follow metabolic panel.

## 2019-09-13 NOTE — Assessment & Plan Note (Addendum)
Colonoscopy 01/2018.  Discussed f/u.  Get blood pressure under better control.

## 2019-09-13 NOTE — Assessment & Plan Note (Signed)
On crestor.  Low cholesterol diet and exercise.  Follow lipid panel and liver function tests.   

## 2019-10-06 ENCOUNTER — Ambulatory Visit (INDEPENDENT_AMBULATORY_CARE_PROVIDER_SITE_OTHER): Payer: Medicare Other | Admitting: Internal Medicine

## 2019-10-06 ENCOUNTER — Other Ambulatory Visit: Payer: Self-pay

## 2019-10-06 ENCOUNTER — Encounter: Payer: Self-pay | Admitting: Internal Medicine

## 2019-10-06 VITALS — BP 114/62 | HR 67 | Temp 97.5°F | Resp 16 | Ht 63.0 in | Wt 128.0 lb

## 2019-10-06 DIAGNOSIS — E7800 Pure hypercholesterolemia, unspecified: Secondary | ICD-10-CM

## 2019-10-06 DIAGNOSIS — Z1231 Encounter for screening mammogram for malignant neoplasm of breast: Secondary | ICD-10-CM

## 2019-10-06 DIAGNOSIS — D649 Anemia, unspecified: Secondary | ICD-10-CM | POA: Diagnosis not present

## 2019-10-06 DIAGNOSIS — I10 Essential (primary) hypertension: Secondary | ICD-10-CM

## 2019-10-06 DIAGNOSIS — Z86010 Personal history of colonic polyps: Secondary | ICD-10-CM

## 2019-10-06 DIAGNOSIS — E039 Hypothyroidism, unspecified: Secondary | ICD-10-CM | POA: Diagnosis not present

## 2019-10-06 NOTE — Progress Notes (Addendum)
[name], female   DOB: 09/08/1945, 74 y.o.   MRN: [id number]   Subjective:    [name], female    DOB: [birth year], 74 y.o.   MRN: [id number]  HPI This visit occurred during the SARS-CoV-2 public health emergency.  Safety protocols were in place, including screening questions prior to the visit, additional usage of staff PPE, and extensive cleaning of exam room while observing appropriate contact time as indicated for disinfecting solutions.  Patient here for a scheduled follow up.  Here to f/u regarding her blood pressure.  Amlodipine added last visit.  States after first dose, she felt "woozy".  Skipped one day and then started taking regularly.  No further problems.  Tolerating them medication.  Stays active.  No chest pain or sob.  No acid reflux.  No abdominal pain.  Bowels moving.  Taking in the evening.  Takes her lisinopril in the morning.  Overall feels good.    Past Medical History:  Diagnosis Date  . GERD (gastroesophageal reflux disease)   . Goiter    s/p thyroidectomy  . H/O syncope   . Hypercholesterolemia   . Hypothyroidism   . Recurrent sinus infections    and allergy problems   Past Surgical History:  Procedure Laterality Date  . COLONOSCOPY WITH PROPOFOL N/A 11/03/2014   Procedure: COLONOSCOPY WITH PROPOFOL;  Surgeon: Manya Silvas, MD;  Location: [facility] ENDOSCOPY;  Service: Endoscopy;  Laterality: N/A;  . COLONOSCOPY WITH PROPOFOL N/A 02/12/2018   Procedure: COLONOSCOPY WITH PROPOFOL;  Surgeon: Manya Silvas, MD;  Location: [facility] ENDOSCOPY;  Service: Endoscopy;  Laterality: N/A;  . [place]  . ESOPHAGOGASTRODUODENOSCOPY N/A 11/03/2014   Procedure: ESOPHAGOGASTRODUODENOSCOPY (EGD);  Surgeon: Manya Silvas, MD;  Location: [facility] ENDOSCOPY;  Service: Endoscopy;  Laterality: N/A;  . LAPAROSCOPIC APPENDECTOMY N/A 11/26/2014   Procedure: APPENDECTOMY LAPAROSCOPIC;  Surgeon: Robert Bellow, MD;   Location: [facility] ORS;  Service: General;  Laterality: N/A;  . thyroidectomy  1979   goiter  . TUBAL LIGATION  1975   Family History  Problem Relation Age of Onset  . Tuberculosis Paternal Grandfather   . Alzheimer's disease Mother   . Goiter Mother   . Cancer Brother   . Cancer Brother   . Cancer Brother   . Cancer Brother    Social History   Socioeconomic History  . Marital status: Married    Spouse name: Not on file  . Number of children: Not on file  . Years of education: Not on file  . Highest education level: Not on file  Occupational History  . Not on file  Tobacco Use  . Smoking status: Former Smoker    Types: Cigarettes    Quit date: 04/16/2009    Years since quitting: 10.4  . Smokeless tobacco: Never Used  Substance and Sexual Activity  . Alcohol use: Yes    Alcohol/week: 3.0 - 4.0 standard drinks    Types: 3 - 4 Standard drinks or equivalent per week    Comment: occ  . Drug use: Yes    Types: Marijuana    Comment: marijuana every day  . Sexual activity: Not on file  Other Topics Concern  . Not on file  Social History Narrative  . Not on file   Social Determinants of Health   Financial Resource Strain: Low Risk   . Difficulty of Paying Living Expenses: Not hard at all  Food Insecurity:  No Food Insecurity  . Worried About Charity fundraiser in the Last Year: Never true  . Ran Out of Food in the Last Year: Never true  Transportation Needs: No Transportation Needs  . Lack of Transportation (Medical): No  . Lack of Transportation (Non-Medical): No  Physical Activity: Insufficiently Active  . Days of Exercise per Week: 3 days  . Minutes of Exercise per Session: 30 min  Stress: No Stress Concern Present  . Feeling of Stress : Not at all  Social Connections:   . Frequency of Communication with Friends and Family:   . Frequency of Social Gatherings with Friends and Family:   . Attends Religious Services:   . Active Member of Clubs or Organizations:   .  Attends Archivist Meetings:   Marland Kitchen Marital Status:     Outpatient Encounter Medications as of 10/06/2019  Medication Sig  . amLODipine (NORVASC) 5 MG tablet Take 1 tablet (5 mg total) by mouth daily.  . Cyanocobalamin (VITAMIN B 12 PO) Take by mouth.  . levothyroxine (SYNTHROID) 50 MCG tablet TAKE 1 TABLET BY MOUTH EVERY DAY  . lisinopril (ZESTRIL) 40 MG tablet TAKE 1 TABLET BY MOUTH EVERY DAY  . rosuvastatin (CRESTOR) 10 MG tablet TAKE 1 TABLET BY MOUTH EVERY DAY   No facility-administered encounter medications on file as of 10/06/2019.    Review of Systems  Constitutional: Negative for appetite change and unexpected weight change.  HENT: Negative for congestion and sinus pressure.   Respiratory: Negative for cough, chest tightness and shortness of breath.   Cardiovascular: Negative for chest pain, palpitations and leg swelling.  Gastrointestinal: Negative for abdominal pain, diarrhea, nausea and vomiting.  Genitourinary: Negative for difficulty urinating and dysuria.  Musculoskeletal: Negative for joint swelling and myalgias.  Skin: Negative for color change and rash.  Neurological: Negative for dizziness, light-headedness and headaches.  Psychiatric/Behavioral: Negative for agitation and dysphoric mood.       Objective:    Physical Exam Vitals reviewed.  Constitutional:      General: She is not in acute distress.    Appearance: Normal appearance.  HENT:     Head: Normocephalic and atraumatic.     Right Ear: External ear normal.     Left Ear: External ear normal.  Eyes:     General: No scleral icterus.       Right eye: No discharge.        Left eye: No discharge.     Conjunctiva/sclera: Conjunctivae normal.  Neck:     Thyroid: No thyromegaly.  Cardiovascular:     Rate and Rhythm: Normal rate and regular rhythm.  Pulmonary:     Effort: No respiratory distress.     Breath sounds: Normal breath sounds. No wheezing.  Abdominal:     General: Bowel sounds are  normal.     Palpations: Abdomen is soft.     Tenderness: There is no abdominal tenderness.  Musculoskeletal:        General: No swelling or tenderness.     Cervical back: Neck supple. No tenderness.  Lymphadenopathy:     Cervical: No cervical adenopathy.  Skin:    Findings: No erythema or rash.  Neurological:     Mental Status: She is alert.  Psychiatric:        Mood and Affect: Mood normal.        Behavior: Behavior normal.     BP 114/62   Pulse 67   Temp (!) 97.5 F (36.4 C)  Resp 16   Ht 5\' 3"  (1.6 m)   Wt 128 lb (58.1 kg)   SpO2 99%   BMI 22.67 kg/m  Wt Readings from Last 3 Encounters:  10/06/19 128 lb (58.1 kg)  09/08/19 127 lb 12.8 oz (58 kg)  03/11/19 133 lb (60.3 kg)     Lab Results  Component Value Date   WBC 6.2 09/03/2019   HGB 13.1 09/03/2019   HCT 38.9 09/03/2019   PLT 213.0 09/03/2019   GLUCOSE 89 10/06/2019   CHOL 159 09/03/2019   TRIG 94.0 09/03/2019   HDL 72.60 09/03/2019   LDLCALC 68 09/03/2019   ALT 23 09/03/2019   AST 29 09/03/2019   NA 137 10/06/2019   K 4.3 10/06/2019   CL 105 10/06/2019   CREATININE 1.19 10/06/2019   BUN 22 10/06/2019   CO2 29 10/06/2019   TSH 1.56 03/09/2019    MM 3D SCREEN BREAST BILATERAL  Result Date: 03/24/2018 CLINICAL DATA:  Screening. EXAM: DIGITAL SCREENING BILATERAL MAMMOGRAM WITH TOMO AND CAD COMPARISON:  Previous exam(s). ACR Breast Density Category d: The breast tissue is extremely dense, which lowers the sensitivity of mammography FINDINGS: There are no findings suspicious for malignancy. Images were processed with CAD. IMPRESSION: No mammographic evidence of malignancy. A result letter of this screening mammogram will be mailed directly to the patient. RECOMMENDATION: Screening mammogram in one year. (Code:SM-B-01Y) BI-RADS CATEGORY  1: Negative. Electronically Signed   By: Franki Cabot M.D.   On: 03/24/2018 16:11       Assessment & Plan:   Problem List Items Addressed This Visit    Anemia     Follow cbc.        Breast cancer screening    Pt wants to hold on scheduling.        Essential hypertension, benign - Primary    On amlodipine and lisinopril.  Blood pressure better.  Continue current medication.  Follow pressures.  Follow metabolic panel.        Relevant Orders   Basic metabolic panel (Completed)   History of colonic polyps    Colonoscopy 01/2018 - multiple polyps.  Recommended f/u in one year.  Overdue.  Need to schedule.        Hypercholesterolemia    On crestor.  Low cholesterol diet and exercise.  Follow lipid panel and liver function tests.        Hypothyroidism    On thyroid replacement.  Follow tsh.           Einar Pheasant, MD

## 2019-10-07 LAB — BASIC METABOLIC PANEL
BUN: 22 mg/dL (ref 6–23)
CO2: 29 mEq/L (ref 19–32)
Calcium: 9.1 mg/dL (ref 8.4–10.5)
Chloride: 105 mEq/L (ref 96–112)
Creatinine, Ser: 1.19 mg/dL (ref 0.40–1.20)
GFR: 53.91 mL/min — ABNORMAL LOW (ref >60.00)
Glucose, Bld: 89 mg/dL (ref 70–99)
Potassium: 4.3 mEq/L (ref 3.5–5.1)
Sodium: 137 mEq/L (ref 135–145)

## 2019-10-11 ENCOUNTER — Telehealth: Payer: Self-pay | Admitting: Internal Medicine

## 2019-10-11 ENCOUNTER — Encounter: Payer: Self-pay | Admitting: Internal Medicine

## 2019-10-11 DIAGNOSIS — Z1239 Encounter for other screening for malignant neoplasm of breast: Secondary | ICD-10-CM | POA: Insufficient documentation

## 2019-10-11 NOTE — Assessment & Plan Note (Signed)
Colonoscopy 01/2018 - multiple polyps.  Recommended f/u in one year.  Overdue.  Need to schedule.

## 2019-10-11 NOTE — Telephone Encounter (Signed)
Per our records, pt had colonoscopy 01/2018 - multiple polyps. Recommended f/u in 01/2019.  I do not have copy of this colonoscopy report.  Need if she had.  If not done, need to refer back to GI.  Let me know if agreeable.

## 2019-10-11 NOTE — Assessment & Plan Note (Signed)
On amlodipine and lisinopril.  Blood pressure better.  Continue current medication.  Follow pressures.  Follow metabolic panel.

## 2019-10-11 NOTE — Assessment & Plan Note (Signed)
Pt wants to hold on scheduling.

## 2019-10-11 NOTE — Assessment & Plan Note (Signed)
On thyroid replacement.  Follow tsh.  

## 2019-10-11 NOTE — Assessment & Plan Note (Signed)
On crestor.  Low cholesterol diet and exercise.  Follow lipid panel and liver function tests.   

## 2019-10-11 NOTE — Assessment & Plan Note (Signed)
Follow cbc.  

## 2019-10-21 NOTE — Telephone Encounter (Signed)
LMTCB

## 2019-10-22 ENCOUNTER — Telehealth: Payer: Self-pay | Admitting: Internal Medicine

## 2019-10-22 NOTE — Telephone Encounter (Signed)
See other note

## 2019-10-22 NOTE — Telephone Encounter (Signed)
Pt was returning call from Wednesday 10-21-19

## 2019-10-26 NOTE — Telephone Encounter (Signed)
Pt is returning your call

## 2019-10-27 NOTE — Telephone Encounter (Signed)
See other note

## 2019-10-27 NOTE — Telephone Encounter (Signed)
Left detailed message for patient. Just need to know if she had colonoscopy done in 2020. If so, need to request report. If no, need referral back to GI.

## 2019-10-29 NOTE — Telephone Encounter (Signed)
Called patient. Call was disconnected.

## 2019-10-29 NOTE — Telephone Encounter (Signed)
Patient stated she has not had this done per her request. She did not want to have this procedure done during the pandemic. Pt stated she would like to hold on GI referral until she sees you in September.

## 2019-11-30 ENCOUNTER — Other Ambulatory Visit: Payer: Self-pay | Admitting: Internal Medicine

## 2020-01-07 ENCOUNTER — Other Ambulatory Visit: Payer: Self-pay

## 2020-01-08 ENCOUNTER — Ambulatory Visit (INDEPENDENT_AMBULATORY_CARE_PROVIDER_SITE_OTHER): Payer: Medicare Other | Admitting: Internal Medicine

## 2020-01-08 VITALS — BP 114/70 | HR 77 | Temp 97.7°F | Resp 16 | Ht 63.0 in | Wt 126.0 lb

## 2020-01-08 DIAGNOSIS — E039 Hypothyroidism, unspecified: Secondary | ICD-10-CM | POA: Diagnosis not present

## 2020-01-08 DIAGNOSIS — E7800 Pure hypercholesterolemia, unspecified: Secondary | ICD-10-CM | POA: Diagnosis not present

## 2020-01-08 DIAGNOSIS — E870 Hyperosmolality and hypernatremia: Secondary | ICD-10-CM | POA: Diagnosis not present

## 2020-01-08 DIAGNOSIS — Z86010 Personal history of colonic polyps: Secondary | ICD-10-CM

## 2020-01-08 DIAGNOSIS — I10 Essential (primary) hypertension: Secondary | ICD-10-CM | POA: Diagnosis not present

## 2020-01-08 DIAGNOSIS — D649 Anemia, unspecified: Secondary | ICD-10-CM

## 2020-01-08 NOTE — Progress Notes (Signed)
[name], female   DOB: Dec 07, 1945, 74 y.o.   MRN: [id number]   Subjective:    [name], female    DOB: 10-Nov-1945, 74 y.o.   MRN: [id number]  HPI This visit occurred during the SARS-CoV-2 public health emergency.  Safety protocols were in place, including screening questions prior to the visit, additional usage of staff PPE, and extensive cleaning of exam room while observing appropriate contact time as indicated for disinfecting solutions.  Patient here for a scheduled follow up.  Here to follow up regarding her blood pressure.  She reports she is doing well.  Feels good.  Stays active.  Handling stress.  No chest pain or sob.  No acid reflux or abdominal pain.  Bowels moving.  Taking her medication.    Past Medical History:  Diagnosis Date  . GERD (gastroesophageal reflux disease)   . Goiter    s/p thyroidectomy  . H/O syncope   . Hypercholesterolemia   . Hypothyroidism   . Recurrent sinus infections    and allergy problems   Past Surgical History:  Procedure Laterality Date  . COLONOSCOPY WITH PROPOFOL N/A 11/03/2014   Procedure: COLONOSCOPY WITH PROPOFOL;  Surgeon: Manya Silvas, MD;  Location: [facility] ENDOSCOPY;  Service: Endoscopy;  Laterality: N/A;  . COLONOSCOPY WITH PROPOFOL N/A 02/12/2018   Procedure: COLONOSCOPY WITH PROPOFOL;  Surgeon: Manya Silvas, MD;  Location: [facility] ENDOSCOPY;  Service: Endoscopy;  Laterality: N/A;  . [place]  . ESOPHAGOGASTRODUODENOSCOPY N/A 11/03/2014   Procedure: ESOPHAGOGASTRODUODENOSCOPY (EGD);  Surgeon: Manya Silvas, MD;  Location: [facility] ENDOSCOPY;  Service: Endoscopy;  Laterality: N/A;  . LAPAROSCOPIC APPENDECTOMY N/A 11/26/2014   Procedure: APPENDECTOMY LAPAROSCOPIC;  Surgeon: Robert Bellow, MD;  Location: [facility] ORS;  Service: General;  Laterality: N/A;  . thyroidectomy  1979   goiter  . TUBAL LIGATION  1975   Family History  Problem Relation Age of Onset  .  Tuberculosis Paternal Grandfather   . Alzheimer's disease Mother   . Goiter Mother   . Cancer Brother   . Cancer Brother   . Cancer Brother   . Cancer Brother    Social History   Socioeconomic History  . Marital status: Married    Spouse name: Not on file  . Number of children: Not on file  . Years of education: Not on file  . Highest education level: Not on file  Occupational History  . Not on file  Tobacco Use  . Smoking status: Former Smoker    Types: Cigarettes    Quit date: 04/16/2009    Years since quitting: 10.7  . Smokeless tobacco: Never Used  Substance and Sexual Activity  . Alcohol use: Yes    Alcohol/week: 3.0 - 4.0 standard drinks    Types: 3 - 4 Standard drinks or equivalent per week    Comment: occ  . Drug use: Yes    Types: Marijuana    Comment: marijuana every day  . Sexual activity: Not on file  Other Topics Concern  . Not on file  Social History Narrative  . Not on file   Social Determinants of Health   Financial Resource Strain: Low Risk   . Difficulty of Paying Living Expenses: Not hard at all  Food Insecurity: No Food Insecurity  . Worried About Charity fundraiser in the Last Year: Never true  . Ran Out of Food in the Last Year: Never true  Transportation Needs: No Transportation Needs  . Lack of Transportation (Medical): No  . Lack of Transportation (Non-Medical): No  Physical Activity: Insufficiently Active  . Days of Exercise per Week: 3 days  . Minutes of Exercise per Session: 30 min  Stress: No Stress Concern Present  . Feeling of Stress : Not at all  Social Connections:   . Frequency of Communication with Friends and Family: Not on file  . Frequency of Social Gatherings with Friends and Family: Not on file  . Attends Religious Services: Not on file  . Active Member of Clubs or Organizations: Not on file  . Attends Archivist Meetings: Not on file  . Marital Status: Not on file    Outpatient Encounter Medications as of  01/08/2020  Medication Sig  . amLODipine (NORVASC) 5 MG tablet TAKE 1 TABLET BY MOUTH EVERY DAY  . Cyanocobalamin (VITAMIN B 12 PO) Take by mouth.  . levothyroxine (SYNTHROID) 50 MCG tablet TAKE 1 TABLET BY MOUTH EVERY DAY  . lisinopril (ZESTRIL) 40 MG tablet TAKE 1 TABLET BY MOUTH EVERY DAY  . rosuvastatin (CRESTOR) 10 MG tablet TAKE 1 TABLET BY MOUTH EVERY DAY   No facility-administered encounter medications on file as of 01/08/2020.    Review of Systems  Constitutional: Negative for appetite change and unexpected weight change.  HENT: Negative for congestion and sinus pressure.   Respiratory: Negative for cough, chest tightness and shortness of breath.   Cardiovascular: Negative for chest pain, palpitations and leg swelling.  Gastrointestinal: Negative for abdominal pain, diarrhea, nausea and vomiting.  Genitourinary: Negative for difficulty urinating and dysuria.  Musculoskeletal: Negative for joint swelling and myalgias.  Skin: Negative for color change and rash.  Neurological: Negative for dizziness, light-headedness and headaches.  Psychiatric/Behavioral: Negative for agitation and dysphoric mood.       Objective:    Physical Exam Vitals reviewed.  Constitutional:      General: She is not in acute distress.    Appearance: Normal appearance.  HENT:     Head: Normocephalic and atraumatic.     Right Ear: External ear normal.     Left Ear: External ear normal.  Eyes:     General: No scleral icterus.       Right eye: No discharge.        Left eye: No discharge.     Conjunctiva/sclera: Conjunctivae normal.  Neck:     Thyroid: No thyromegaly.  Cardiovascular:     Rate and Rhythm: Normal rate and regular rhythm.  Pulmonary:     Effort: No respiratory distress.     Breath sounds: Normal breath sounds. No wheezing.  Abdominal:     General: Bowel sounds are normal.     Palpations: Abdomen is soft.     Tenderness: There is no abdominal tenderness.  Musculoskeletal:         General: No swelling or tenderness.     Cervical back: Neck supple. No tenderness.  Lymphadenopathy:     Cervical: No cervical adenopathy.  Skin:    Findings: No erythema or rash.  Neurological:     Mental Status: She is alert.  Psychiatric:        Mood and Affect: Mood normal.        Behavior: Behavior normal.     BP 114/70   Pulse 77   Temp 97.7 F (36.5 C) (Oral)   Resp 16   Ht 5\' 3"  (1.6 m)   Wt 126 lb (57.2 kg)  SpO2 99%   BMI 22.32 kg/m  Wt Readings from Last 3 Encounters:  01/08/20 126 lb (57.2 kg)  10/06/19 128 lb (58.1 kg)  09/08/19 127 lb 12.8 oz (58 kg)     Lab Results  Component Value Date   WBC 6.2 09/03/2019   HGB 13.1 09/03/2019   HCT 38.9 09/03/2019   PLT 213.0 09/03/2019   GLUCOSE 94 01/08/2020   CHOL 145 01/08/2020   TRIG 58 01/08/2020   HDL 77 01/08/2020   LDLCALC 55 01/08/2020   ALT 11 01/08/2020   AST 20 01/08/2020   NA 140 01/12/2020   K 4.4 01/08/2020   CL 113 (H) 01/08/2020   CREATININE 1.15 (H) 01/08/2020   BUN 14 01/08/2020   CO2 21 01/08/2020   TSH 1.98 01/08/2020    MM 3D SCREEN BREAST BILATERAL  Result Date: 03/24/2018 CLINICAL DATA:  Screening. EXAM: DIGITAL SCREENING BILATERAL MAMMOGRAM WITH TOMO AND CAD COMPARISON:  Previous exam(s). ACR Breast Density Category d: The breast tissue is extremely dense, which lowers the sensitivity of mammography FINDINGS: There are no findings suspicious for malignancy. Images were processed with CAD. IMPRESSION: No mammographic evidence of malignancy. A result letter of this screening mammogram will be mailed directly to the patient. RECOMMENDATION: Screening mammogram in one year. (Code:SM-B-01Y) BI-RADS CATEGORY  1: Negative. Electronically Signed   By: Franki Cabot M.D.   On: 03/24/2018 16:11       Assessment & Plan:   Problem List Items Addressed This Visit    Hypothyroidism    On thyroid replacement.  Follow tsh.       Relevant Orders   TSH (Completed)   Hypercholesterolemia      On crestor.  Low cholesterol diet and exercise.  Follow lipid panel and liver function tests.        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  Recommended f/u colonoscopy in one year.  She is ready now for f/u.        Relevant Orders   Ambulatory referral to Gastroenterology   Essential hypertension, benign    Blood pressure better.  Continue lisinopril and amlodipine.  Follow pressures.  Follow metabolic panel.        Relevant Orders   Basic metabolic panel (Completed)   Anemia    Follow cbc.        Other Visit Diagnoses    Hypernatremia    -  Primary   Relevant Orders   Sodium (Completed)       Einar Pheasant, MD

## 2020-01-09 LAB — HEPATIC FUNCTION PANEL
AG Ratio: 1.6 (calc) (ref 1.0–2.5)
ALT: 11 U/L (ref 6–29)
AST: 20 U/L (ref 10–35)
Albumin: 4.4 g/dL (ref 3.6–5.1)
Alkaline phosphatase (APISO): 74 U/L (ref 37–153)
Bilirubin, Direct: 0.1 mg/dL (ref 0.0–0.2)
Globulin: 2.8 g/dL (calc) (ref 1.9–3.7)
Indirect Bilirubin: 0.1 mg/dL (calc) — ABNORMAL LOW (ref 0.2–1.2)
Total Bilirubin: 0.2 mg/dL (ref 0.2–1.2)
Total Protein: 7.2 g/dL (ref 6.1–8.1)

## 2020-01-09 LAB — LIPID PANEL
Cholesterol: 145 mg/dL (ref <200)
HDL: 77 mg/dL (ref >=50)
LDL Cholesterol (Calc): 55 mg/dL (calc)
Non-HDL Cholesterol (Calc): 68 mg/dL (calc) (ref <130)
Total CHOL/HDL Ratio: 1.9 (calc) (ref <5.0)
Triglycerides: 58 mg/dL (ref <150)

## 2020-01-09 LAB — BASIC METABOLIC PANEL
BUN/Creatinine Ratio: 12 (calc) (ref 6–22)
BUN: 14 mg/dL (ref 7–25)
CO2: 21 mmol/L (ref 20–32)
Calcium: 9.9 mg/dL (ref 8.6–10.4)
Chloride: 113 mmol/L — ABNORMAL HIGH (ref 98–110)
Creat: 1.15 mg/dL — ABNORMAL HIGH (ref 0.60–0.93)
Glucose, Bld: 94 mg/dL (ref 65–99)
Potassium: 4.4 mmol/L (ref 3.5–5.3)
Sodium: 154 mmol/L — ABNORMAL HIGH (ref 135–146)

## 2020-01-09 LAB — TSH: TSH: 1.98 mIU/L (ref 0.40–4.50)

## 2020-01-12 ENCOUNTER — Other Ambulatory Visit (INDEPENDENT_AMBULATORY_CARE_PROVIDER_SITE_OTHER): Payer: Medicare Other

## 2020-01-12 ENCOUNTER — Other Ambulatory Visit: Payer: Self-pay

## 2020-01-12 DIAGNOSIS — E870 Hyperosmolality and hypernatremia: Secondary | ICD-10-CM

## 2020-01-13 LAB — SODIUM: Sodium: 140 mEq/L (ref 135–145)

## 2020-01-17 ENCOUNTER — Encounter: Payer: Self-pay | Admitting: Internal Medicine

## 2020-01-17 NOTE — Assessment & Plan Note (Signed)
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  Recommended f/u colonoscopy in one year.  She is ready now for f/u.

## 2020-01-17 NOTE — Assessment & Plan Note (Signed)
On crestor.  Low cholesterol diet and exercise.  Follow lipid panel and liver function tests.   

## 2020-01-17 NOTE — Assessment & Plan Note (Signed)
Follow cbc.  

## 2020-01-17 NOTE — Assessment & Plan Note (Signed)
Blood pressure better.  Continue lisinopril and amlodipine.  Follow pressures.  Follow metabolic panel.

## 2020-01-17 NOTE — Assessment & Plan Note (Signed)
On thyroid replacement.  Follow tsh.  

## 2020-01-19 ENCOUNTER — Other Ambulatory Visit: Payer: Medicare Other

## 2020-01-22 ENCOUNTER — Other Ambulatory Visit: Payer: Self-pay | Admitting: Internal Medicine

## 2020-01-26 ENCOUNTER — Telehealth: Payer: Self-pay

## 2020-01-26 NOTE — Telephone Encounter (Signed)
Pt said she went to [facility] this morning because her left jaw is swollen and interfering with her speech and swallowing. She needs Dr. Nicki Reaper to look at her information and she is concerned because they don't know why it is swelling. They put her on medication and she is just worried. She said needs a call from Dr. Nicki Reaper.

## 2020-01-26 NOTE — Telephone Encounter (Signed)
Where was she seen.  Need records asap.  Need to confirm no angioedema.  Confirm with pt exactly where she is swelling.  Any lip or tongue swelling.  Any sob or filling like throat closing up?  When was she seen?  How is it now compared to when first occurred.

## 2020-01-26 NOTE — Telephone Encounter (Signed)
Need copy of records for urgent care visit.

## 2020-01-26 NOTE — Telephone Encounter (Signed)
Called patient to confirm doing ok. She was seen at [facility] in [place] and was given Augmentin BID for 7 days, Prednisone 20 mg- 2 tabs q day x 3 days, and magic mouthwash. I have requested records and advised her to keep [place] posted. She thanked [place] for calling and will let us know if symptoms do not improve

## 2020-01-26 NOTE — Telephone Encounter (Signed)
Was seen at [facility] in [place] this morning. No lip or tongue swelling. Swelling is below her left jaw line. Able to eat and drink. She has only had one dose of meds so not much change but is not worsening. She will go to ED if develops any new acute symptoms.

## 2020-01-26 NOTE — Telephone Encounter (Signed)
Requested.

## 2020-02-03 ENCOUNTER — Other Ambulatory Visit: Payer: Self-pay

## 2020-02-03 ENCOUNTER — Ambulatory Visit
Admission: RE | Admit: 2020-02-03 | Discharge: 2020-02-03 | Disposition: A | Discharge status: Home / Self Care | Payer: Medicare Other | Source: Ambulatory Visit | Attending: Internal Medicine | Admitting: Internal Medicine

## 2020-02-03 DIAGNOSIS — Z1231 Encounter for screening mammogram for malignant neoplasm of breast: Secondary | ICD-10-CM | POA: Diagnosis present

## 2020-02-04 NOTE — Telephone Encounter (Signed)
Requested again via fax and phone

## 2020-02-15 ENCOUNTER — Other Ambulatory Visit: Payer: Self-pay | Admitting: Internal Medicine

## 2020-03-18 ENCOUNTER — Ambulatory Visit (INDEPENDENT_AMBULATORY_CARE_PROVIDER_SITE_OTHER): Payer: Medicare Other

## 2020-03-18 VITALS — Ht 63.0 in | Wt 126.0 lb

## 2020-03-18 DIAGNOSIS — Z0000 Encounter for general adult medical examination without abnormal findings: Secondary | ICD-10-CM

## 2020-03-18 DIAGNOSIS — Z1159 Encounter for screening for other viral diseases: Secondary | ICD-10-CM | POA: Diagnosis not present

## 2020-03-18 NOTE — Patient Instructions (Addendum)
Jessica Wall , Thank you for taking time to come for your Medicare Wellness Visit. I appreciate your ongoing commitment to your health goals. Please review the following plan we discussed and let me know if I can assist you in the future.   These are the goals we discussed: Goals    . Follow up with Primary Care Provider     As needed       This is a list of the screening recommended for you and due dates:  Health Maintenance  Topic Date Due  .  Hepatitis C: One time screening is recommended by Center for Disease Control  (CDC) for  adults born from [birth year] through 1963.   Never done  . DEXA scan (bone density measurement)  05/19/2024*  . Tetanus Vaccine  05/19/2024*  . Mammogram  02/02/2021  . Colon Cancer Screening  02/12/2021  . Flu Shot  Discontinued  . COVID-19 Vaccine  Discontinued  . Pneumonia vaccines  Discontinued  *Topic was postponed. The date shown is not the original due date.   Immunizations  There is no immunization history on file for this patient.  CPE 05/11/20 @ 10:00; fasting for labs.   Advanced directives: End of life planning; Advance aging; Advanced directives discussed.  Copy of current HCPOA/Living Will requested.    Conditions/risks identified: none new.   Follow up in one year for your annual wellness visit.   Preventive Care 73 Years and Older, Female Preventive care refers to lifestyle choices and visits with your health care provider that can promote health and wellness. What does preventive care include?  A yearly physical exam. This is also called an annual well check.  Dental exams once or twice a year.  Routine eye exams. Ask your health care provider how often you should have your eyes checked.  Personal lifestyle choices, including:  Daily care of your teeth and gums.  Regular physical activity.  Eating a healthy diet.  Avoiding tobacco and drug use.  Limiting alcohol use.  Practicing safe sex.  Taking low-dose aspirin every  day.  Taking vitamin and mineral supplements as recommended by your health care provider. What happens during an annual well check? The services and screenings done by your health care provider during your annual well check will depend on your age, overall health, lifestyle risk factors, and family history of disease. Counseling  Your health care provider may ask you questions about your:  Alcohol use.  Tobacco use.  Drug use.  Emotional well-being.  Home and relationship well-being.  Sexual activity.  Eating habits.  History of falls.  Memory and ability to understand (cognition).  Work and work Statistician.  Reproductive health. Screening  You may have the following tests or measurements:  Height, weight, and BMI.  Blood pressure.  Lipid and cholesterol levels. These may be checked every 5 years, or more frequently if you are over 39 years old.  Skin check.  Lung cancer screening. You may have this screening every year starting at age 89 if you have a 30-pack-year history of smoking and currently smoke or have quit within the past 15 years.  Fecal occult blood test (FOBT) of the stool. You may have this test every year starting at age 72.  Flexible sigmoidoscopy or colonoscopy. You may have a sigmoidoscopy every 5 years or a colonoscopy every 10 years starting at age 25.  Hepatitis C blood test.  Hepatitis B blood test.  Sexually transmitted disease (STD) testing.  Diabetes screening. This  is done by checking your blood sugar (glucose) after you have not eaten for a while (fasting). You may have this done every 1-3 years.  Bone density scan. This is done to screen for osteoporosis. You may have this done starting at age 33.  Mammogram. This may be done every 1-2 years. Talk to your health care provider about how often you should have regular mammograms. Talk with your health care provider about your test results, treatment options, and if necessary, the need  for more tests. Vaccines  Your health care provider may recommend certain vaccines, such as:  Influenza vaccine. This is recommended every year.  Tetanus, diphtheria, and acellular pertussis (Tdap, Td) vaccine. You may need a Td booster every 10 years.  Zoster vaccine. You may need this after age 6.  Pneumococcal 13-valent conjugate (PCV13) vaccine. One dose is recommended after age 54.  Pneumococcal polysaccharide (PPSV23) vaccine. One dose is recommended after age 32. Talk to your health care provider about which screenings and vaccines you need and how often you need them. This information is not intended to replace advice given to you by your health care provider. Make sure you discuss any questions you have with your health care provider. Document Released: 04/29/2015 Document Revised: 12/21/2015 Document Reviewed: 02/01/2015 Elsevier Interactive Patient Education  2017 [place] Prevention in the Home Falls can cause injuries. They can happen to people of all ages. There are many things you can do to make your home safe and to help prevent falls. What can I do on the outside of my home?  Regularly fix the edges of walkways and driveways and fix any cracks.  Remove anything that might make you trip as you walk through a door, such as a raised step or threshold.  Trim any bushes or trees on the path to your home.  Use bright outdoor lighting.  Clear any walking paths of anything that might make someone trip, such as rocks or tools.  Regularly check to see if handrails are loose or broken. Make sure that both sides of any steps have handrails.  Any raised decks and porches should have guardrails on the edges.  Have any leaves, snow, or ice cleared regularly.  Use sand or salt on walking paths during winter.  Clean up any spills in your garage right away. This includes oil or grease spills. What can I do in the bathroom?  Use night lights.  Install grab bars  by the toilet and in the tub and shower. Do not use towel bars as grab bars.  Use non-skid mats or decals in the tub or shower.  If you need to sit down in the shower, use a plastic, non-slip stool.  Keep the floor dry. Clean up any water that spills on the floor as soon as it happens.  Remove soap buildup in the tub or shower regularly.  Attach bath mats securely with double-sided non-slip rug tape.  Do not have throw rugs and other things on the floor that can make you trip. What can I do in the bedroom?  Use night lights.  Make sure that you have a light by your bed that is easy to reach.  Do not use any sheets or blankets that are too big for your bed. They should not hang down onto the floor.  Have a firm chair that has side arms. You can use this for support while you get dressed.  Do not have throw rugs and other  things on the floor that can make you trip. What can I do in the kitchen?  Clean up any spills right away.  Avoid walking on wet floors.  Keep items that you use a lot in easy-to-reach places.  If you need to reach something above you, use a strong step stool that has a grab bar.  Keep electrical cords out of the way.  Do not use floor polish or wax that makes floors slippery. If you must use wax, use non-skid floor wax.  Do not have throw rugs and other things on the floor that can make you trip. What can I do with my stairs?  Do not leave any items on the stairs.  Make sure that there are handrails on both sides of the stairs and use them. Fix handrails that are broken or loose. Make sure that handrails are as long as the stairways.  Check any carpeting to make sure that it is firmly attached to the stairs. Fix any carpet that is loose or worn.  Avoid having throw rugs at the top or bottom of the stairs. If you do have throw rugs, attach them to the floor with carpet tape.  Make sure that you have a light switch at the top of the stairs and the  bottom of the stairs. If you do not have them, ask someone to add them for you. What else can I do to help prevent falls?  Wear shoes that:  Do not have high heels.  Have rubber bottoms.  Are comfortable and fit you well.  Are closed at the toe. Do not wear sandals.  If you use a stepladder:  Make sure that it is fully opened. Do not climb a closed stepladder.  Make sure that both sides of the stepladder are locked into place.  Ask someone to hold it for you, if possible.  Clearly mark and make sure that you can see:  Any grab bars or handrails.  First and last steps.  Where the edge of each step is.  Use tools that help you move around (mobility aids) if they are needed. These include:  Canes.  Walkers.  Scooters.  Crutches.  Turn on the lights when you go into a dark area. Replace any light bulbs as soon as they burn out.  Set up your furniture so you have a clear path. Avoid moving your furniture around.  If any of your floors are uneven, fix them.  If there are any pets around you, be aware of where they are.  Review your medicines with your doctor. Some medicines can make you feel dizzy. This can increase your chance of falling. Ask your doctor what other things that you can do to help prevent falls. This information is not intended to replace advice given to you by your health care provider. Make sure you discuss any questions you have with your health care provider. Document Released: 01/27/2009 Document Revised: 09/08/2015 Document Reviewed: 05/07/2014 Elsevier Interactive Patient Education  2017 [employer].

## 2020-03-18 NOTE — Progress Notes (Addendum)
Subjective:   Jessica Wall is a 74 y.o. female who presents for Medicare Annual (Subsequent) preventive examination.  Review of Systems    No ROS.  Medicare Wellness Virtual Visit.   Cardiac Risk Factors include: advanced age (>60men, >72 women);hypertension     Objective:    Today's Vitals   03/18/20 0934  Weight: 126 lb (57.2 kg)  Height: 5\' 3"  (1.6 m)   Body mass index is 22.32 kg/m.  Advanced Directives 03/18/2020 03/18/2019 02/12/2018 11/26/2014  Does Patient Have a Medical Advance Directive? No No No No  Would patient like information on creating a medical advance directive? No - Patient declined Yes (MAU/Ambulatory/Procedural Areas - Information given) Yes (MAU/Ambulatory/Procedural Areas - Information given) No - patient declined information    Current Medications (verified) Outpatient Encounter Medications as of 03/18/2020  Medication Sig  . amLODipine (NORVASC) 5 MG tablet TAKE 1 TABLET BY MOUTH EVERY DAY  . Cyanocobalamin (VITAMIN B 12 PO) Take by mouth.  . levothyroxine (SYNTHROID) 50 MCG tablet TAKE 1 TABLET BY MOUTH EVERY DAY  . lisinopril (ZESTRIL) 40 MG tablet TAKE 1 TABLET BY MOUTH EVERY DAY  . rosuvastatin (CRESTOR) 10 MG tablet TAKE 1 TABLET BY MOUTH EVERY DAY   No facility-administered encounter medications on file as of 03/18/2020.    Allergies (verified) Patient has no known allergies.   History: Past Medical History:  Diagnosis Date  . GERD (gastroesophageal reflux disease)   . Goiter    s/p thyroidectomy  . H/O syncope   . Hypercholesterolemia   . Hypothyroidism   . Recurrent sinus infections    and allergy problems   Past Surgical History:  Procedure Laterality Date  . BREAST BIOPSY    . COLONOSCOPY WITH PROPOFOL N/A 11/03/2014   Procedure: COLONOSCOPY WITH PROPOFOL;  Surgeon: Manya Silvas, MD;  Location: [facility] ENDOSCOPY;  Service: Endoscopy;  Laterality: N/A;  . COLONOSCOPY WITH PROPOFOL N/A 02/12/2018   Procedure: COLONOSCOPY WITH  PROPOFOL;  Surgeon: Manya Silvas, MD;  Location: [facility] ENDOSCOPY;  Service: Endoscopy;  Laterality: N/A;  . [place]  . ESOPHAGOGASTRODUODENOSCOPY N/A 11/03/2014   Procedure: ESOPHAGOGASTRODUODENOSCOPY (EGD);  Surgeon: Manya Silvas, MD;  Location: [facility] ENDOSCOPY;  Service: Endoscopy;  Laterality: N/A;  . LAPAROSCOPIC APPENDECTOMY N/A 11/26/2014   Procedure: APPENDECTOMY LAPAROSCOPIC;  Surgeon: Robert Bellow, MD;  Location: [facility] ORS;  Service: General;  Laterality: N/A;  . thyroidectomy  1979   goiter  . TUBAL LIGATION  1975   Family History  Problem Relation Age of Onset  . Tuberculosis Paternal Grandfather   . Alzheimer's disease Mother   . Goiter Mother   . Cancer Brother   . Cancer Brother   . Cancer Brother   . Cancer Brother    Social History   Socioeconomic History  . Marital status: Married    Spouse name: Not on file  . Number of children: Not on file  . Years of education: Not on file  . Highest education level: Not on file  Occupational History  . Not on file  Tobacco Use  . Smoking status: Former Smoker    Types: Cigarettes    Quit date: 04/16/2009    Years since quitting: 10.9  . Smokeless tobacco: Never Used  Substance and Sexual Activity  . Alcohol use: Yes    Alcohol/week: 3.0 - 4.0 standard drinks    Types: 3 - 4 Standard drinks or equivalent per week  Comment: occ  . Drug use: Yes    Types: Marijuana    Comment: marijuana every day  . Sexual activity: Not on file  Other Topics Concern  . Not on file  Social History Narrative  . Not on file   Social Determinants of Health   Financial Resource Strain: Low Risk   . Difficulty of Paying Living Expenses: Not hard at all  Food Insecurity: No Food Insecurity  . Worried About Charity fundraiser in the Last Year: Never true  . Ran Out of Food in the Last Year: Never true  Transportation Needs: No Transportation Needs  . Lack of Transportation (Medical):  No  . Lack of Transportation (Non-Medical): No  Physical Activity:   . Days of Exercise per Week: Not on file  . Minutes of Exercise per Session: Not on file  Stress: No Stress Concern Present  . Feeling of Stress : Not at all  Social Connections: Unknown  . Frequency of Communication with Friends and Family: More than three times a week  . Frequency of Social Gatherings with Friends and Family: More than three times a week  . Attends Religious Services: Not on file  . Active Member of Clubs or Organizations: Not on file  . Attends Archivist Meetings: Not on file  . Marital Status: Not on file    Tobacco Counseling Counseling given: Not Answered   Clinical Intake:  Pre-visit preparation completed: Yes        Diabetes: No  How often do you need to have someone help you when you read instructions, pamphlets, or other written materials from your doctor or pharmacy?: 1 - Never  Interpreter Needed?: No    Activities of Daily Living In your present state of health, do you have any difficulty performing the following activities: 03/18/2020  Hearing? N  Vision? N  Difficulty concentrating or making decisions? N  Walking or climbing stairs? N  Dressing or bathing? N  Doing errands, shopping? N  Preparing Food and eating ? N  Using the Toilet? N  In the past six months, have you accidently leaked urine? N  Do you have problems with loss of bowel control? N  Managing your Medications? N  Managing your Finances? N  Housekeeping or managing your Housekeeping? N  Some recent data might be hidden    Patient Care Team: Einar Pheasant, MD as PCP - General (Internal Medicine) Einar Pheasant, MD (Internal Medicine) Bary Castilla Forest Gleason, MD (General Surgery)  Indicate any recent Medical Services you may have received from other than [facility] providers in the past year (date may be approximate).     Assessment:   This is a routine wellness examination for Jessica Wall.  I  connected with Jessica Wall today by telephone and verified that I am speaking with the correct person using two identifiers. Location patient: home Location provider: work Persons participating in the virtual visit: patient, Marine scientist.    I discussed the limitations, risks, security and privacy concerns of performing an evaluation and management service by telephone and the availability of in person appointments. The patient expressed understanding and verbally consented to this telephonic visit.    Interactive audio and video telecommunications were attempted between this provider and patient, however failed, due to patient having technical difficulties OR patient did not have access to video capability.  We continued and completed visit with audio only.  Some vital signs may be absent or patient reported.   Hearing/Vision screen  Hearing Screening  125Hz  250Hz  500Hz  1000Hz  2000Hz  3000Hz  4000Hz  6000Hz  8000Hz   Right ear:           Left ear:           Comments: Patient is able to hear conversational tones without difficulty. No issues reported.  Vision Screening Comments: Visual acuity not assessed, virtual visit. They have seen their ophthalmologist in the last 12 months.   Dietary issues and exercise activities discussed: Current Exercise Habits: Home exercise routine, Type of exercise: walking  Healthy diet Good water intake  Goals    . Follow up with Primary Care Provider     As needed      Depression Screen PHQ 2/9 Scores 03/18/2020 09/08/2019 03/18/2019 08/27/2018 03/20/2016 02/01/2016 05/24/2015  PHQ - 2 Score 0 0 0 0 0 0 0    Fall Risk Fall Risk  03/18/2020 09/08/2019 03/18/2019 03/20/2016 02/01/2016  Falls in the past year? 0 0 0 No No  Number falls in past yr: 0 - - - -  Injury with Fall? 0 - - - -  Follow up Falls evaluation completed Falls evaluation completed Education provided;Falls prevention discussed - -   Handrails in use when climbing stairs? Yes Home free of loose throw  rugs in walkways, pet beds, electrical cords, etc? Yes  Adequate lighting in your home to reduce risk of falls? Yes   ASSISTIVE DEVICES UTILIZED TO PREVENT FALLS: Use of a cane, walker or w/c? No   TIMED UP AND GO: Was the test performed? No .   Cognitive Function:  Patient is alert and oriented x3.  Denies difficulty focusing, making decisions, memory loss.  Enjoys socializing with others and completes puzzle books for brain health.  MMSE/6CIT deferred. Normal by direct communication/observation.   6CIT Screen 03/18/2019  What Year? 0 points  What month? 0 points  What time? 0 points  Count back from 20 0 points  Months in reverse 0 points  Repeat phrase 0 points  Total Score 0   Immunizations  There is no immunization history on file for this patient.  Health Maintenance Health Maintenance  Topic Date Due  . Hepatitis C Screening  Never done  . DEXA SCAN  05/19/2024 (Originally 06/27/2012)  . TETANUS/TDAP  05/19/2024 (Originally 06/28/1966)  . MAMMOGRAM  02/02/2021  . COLONOSCOPY  02/12/2021  . INFLUENZA VACCINE  Discontinued  . COVID-19 Vaccine  Discontinued  . PNA vac Low Risk Adult  Discontinued   Colorectal cancer screening: Completed 02/12/18. Repeat every 3 years   Mammogram status: Completed 02/03/20. Repeat every year   Bone Density- previously postponed.   Lung Cancer Screening: (Low Dose CT Chest recommended if Age 29-80 years, 30 pack-year currently smoking OR have quit w/in 15years.) does not qualify.    Hepatitis C Screening: Consent given to place order.   Vision Screening: Recommended annual ophthalmology exams for early detection of glaucoma and other disorders of the eye. Is the patient up to date with their annual eye exam?  Yes   Dental Screening: Recommended annual dental exams for proper oral hygiene. Visits every 6 months.   Community Resource Referral / Chronic Care Management: CRR required this visit?  No   CCM required this visit?  No        Plan:   Keep all routine maintenance appointments.   Cpe 05/11/20 @ 10:00; fasting for labs during appointment that morning.  I have personally reviewed and noted the following in the patient's chart:   . Medical and social history .  Use of alcohol, tobacco or illicit drugs  . Current medications and supplements . Functional ability and status . Nutritional status . Physical activity . Advanced directives . List of other physicians . Hospitalizations, surgeries, and ER visits in previous 12 months . Vitals . Screenings to include cognitive, depression, and falls . Referrals and appointments  In addition, I have reviewed and discussed with patient certain preventive protocols, quality metrics, and best practice recommendations. A written personalized care plan for preventive services as well as general preventive health recommendations were provided to patient via mail.     Varney Biles, LPN   58/11/5025     Reviewed above information.  Agree with assessment and plan.    Dr Nicki Reaper

## 2020-05-09 ENCOUNTER — Other Ambulatory Visit: Payer: Self-pay

## 2020-05-11 ENCOUNTER — Other Ambulatory Visit: Payer: Self-pay

## 2020-05-11 ENCOUNTER — Ambulatory Visit (INDEPENDENT_AMBULATORY_CARE_PROVIDER_SITE_OTHER): Payer: Medicare Other | Admitting: Internal Medicine

## 2020-05-11 ENCOUNTER — Encounter: Payer: Self-pay | Admitting: Internal Medicine

## 2020-05-11 ENCOUNTER — Other Ambulatory Visit: Payer: Self-pay | Admitting: Internal Medicine

## 2020-05-11 VITALS — BP 112/60 | HR 62 | Temp 98.5°F | Resp 16 | Ht 63.0 in | Wt 125.0 lb

## 2020-05-11 DIAGNOSIS — Z0000 Encounter for general adult medical examination without abnormal findings: Secondary | ICD-10-CM

## 2020-05-11 DIAGNOSIS — D649 Anemia, unspecified: Secondary | ICD-10-CM

## 2020-05-11 DIAGNOSIS — E7800 Pure hypercholesterolemia, unspecified: Secondary | ICD-10-CM | POA: Diagnosis not present

## 2020-05-11 DIAGNOSIS — Z1159 Encounter for screening for other viral diseases: Secondary | ICD-10-CM

## 2020-05-11 DIAGNOSIS — E039 Hypothyroidism, unspecified: Secondary | ICD-10-CM

## 2020-05-11 DIAGNOSIS — Z86010 Personal history of colonic polyps: Secondary | ICD-10-CM

## 2020-05-11 DIAGNOSIS — I10 Essential (primary) hypertension: Secondary | ICD-10-CM | POA: Diagnosis not present

## 2020-05-11 LAB — BASIC METABOLIC PANEL
BUN: 12 mg/dL (ref 6–23)
CO2: 30 mEq/L (ref 19–32)
Calcium: 9.5 mg/dL (ref 8.4–10.5)
Chloride: 102 mEq/L (ref 96–112)
Creatinine, Ser: 0.98 mg/dL (ref 0.40–1.20)
GFR: 57.52 mL/min — ABNORMAL LOW (ref >60.00)
Glucose, Bld: 77 mg/dL (ref 70–99)
Potassium: 4.2 mEq/L (ref 3.5–5.1)
Sodium: 137 mEq/L (ref 135–145)

## 2020-05-11 LAB — CBC WITH DIFFERENTIAL/PLATELET
Basophils Absolute: 0 10*3/uL (ref 0.0–0.1)
Basophils Relative: 0.6 % (ref 0.0–3.0)
Eosinophils Absolute: 0.1 10*3/uL (ref 0.0–0.7)
Eosinophils Relative: 2.5 % (ref 0.0–5.0)
HCT: 35.7 % — ABNORMAL LOW (ref 36.0–46.0)
Hemoglobin: 11.9 g/dL — ABNORMAL LOW (ref 12.0–15.0)
Lymphocytes Relative: 48.7 % — ABNORMAL HIGH (ref 12.0–46.0)
Lymphs Abs: 2.3 10*3/uL (ref 0.7–4.0)
MCHC: 33.4 g/dL (ref 30.0–36.0)
MCV: 97.5 fl (ref 78.0–100.0)
Monocytes Absolute: 0.2 10*3/uL (ref 0.1–1.0)
Monocytes Relative: 5.3 % (ref 3.0–12.0)
Neutro Abs: 2 10*3/uL (ref 1.4–7.7)
Neutrophils Relative %: 42.9 % — ABNORMAL LOW (ref 43.0–77.0)
Platelets: 206 10*3/uL (ref 150.0–400.0)
RBC: 3.66 Mil/uL — ABNORMAL LOW (ref 3.87–5.11)
RDW: 14.7 % (ref 11.5–15.5)
WBC: 4.7 10*3/uL (ref 4.0–10.5)

## 2020-05-11 LAB — HEPATIC FUNCTION PANEL
ALT: 13 U/L (ref 0–35)
AST: 19 U/L (ref 0–37)
Albumin: 4.1 g/dL (ref 3.5–5.2)
Alkaline Phosphatase: 77 U/L (ref 39–117)
Bilirubin, Direct: 0.1 mg/dL (ref 0.0–0.3)
Total Bilirubin: 0.4 mg/dL (ref 0.2–1.2)
Total Protein: 6.7 g/dL (ref 6.0–8.3)

## 2020-05-11 LAB — LIPID PANEL
Cholesterol: 143 mg/dL (ref 0–200)
HDL: 73.4 mg/dL (ref >39.00)
LDL Cholesterol: 56 mg/dL (ref 0–99)
NonHDL: 69.63
Total CHOL/HDL Ratio: 2
Triglycerides: 68 mg/dL (ref 0.0–149.0)
VLDL: 13.6 mg/dL (ref 0.0–40.0)

## 2020-05-11 NOTE — Progress Notes (Signed)
[name], female   DOB: 1945/12/23, 75 y.o.   MRN: [id number]   Subjective:    [name], female    DOB: [birth year], 75 y.o.   MRN: [id number]  HPI This visit occurred during the SARS-CoV-2 public health emergency.  Safety protocols were in place, including screening questions prior to the visit, additional usage of staff PPE, and extensive cleaning of exam room while observing appropriate contact time as indicated for disinfecting solutions.  Patient here for her physical exam.  Doing well.  Stays active.  No chest pain or sob with increased activity or exertion.  No acid reflux.  No abdominal pain.  Bowels moving.  Is scheduled f/u with GI next month - for f/u colonoscopy.  Overdue.  Up to date with mammogram.    Past Medical History:  Diagnosis Date  . GERD (gastroesophageal reflux disease)   . Goiter    s/p thyroidectomy  . H/O syncope   . Hypercholesterolemia   . Hypothyroidism   . Recurrent sinus infections    and allergy problems   Past Surgical History:  Procedure Laterality Date  . BREAST BIOPSY    . COLONOSCOPY WITH PROPOFOL N/A 11/03/2014   Procedure: COLONOSCOPY WITH PROPOFOL;  Surgeon: Manya Silvas, MD;  Location: [facility] ENDOSCOPY;  Service: Endoscopy;  Laterality: N/A;  . COLONOSCOPY WITH PROPOFOL N/A 02/12/2018   Procedure: COLONOSCOPY WITH PROPOFOL;  Surgeon: Manya Silvas, MD;  Location: [facility] ENDOSCOPY;  Service: Endoscopy;  Laterality: N/A;  . [place]  . ESOPHAGOGASTRODUODENOSCOPY N/A 11/03/2014   Procedure: ESOPHAGOGASTRODUODENOSCOPY (EGD);  Surgeon: Manya Silvas, MD;  Location: [facility] ENDOSCOPY;  Service: Endoscopy;  Laterality: N/A;  . LAPAROSCOPIC APPENDECTOMY N/A 11/26/2014   Procedure: APPENDECTOMY LAPAROSCOPIC;  Surgeon: Robert Bellow, MD;  Location: [facility] ORS;  Service: General;  Laterality: N/A;  . thyroidectomy  1979   goiter  . TUBAL LIGATION  1975   Family History   Problem Relation Age of Onset  . Tuberculosis Paternal Grandfather   . Alzheimer's disease Mother   . Goiter Mother   . Cancer Brother   . Cancer Brother   . Cancer Brother   . Cancer Brother    Social History   Socioeconomic History  . Marital status: Married    Spouse name: Not on file  . Number of children: Not on file  . Years of education: Not on file  . Highest education level: Not on file  Occupational History  . Not on file  Tobacco Use  . Smoking status: Former Smoker    Types: Cigarettes    Quit date: 04/16/2009    Years since quitting: 11.0  . Smokeless tobacco: Never Used  Substance and Sexual Activity  . Alcohol use: Yes    Alcohol/week: 3.0 - 4.0 standard drinks    Types: 3 - 4 Standard drinks or equivalent per week    Comment: occ  . Drug use: Yes    Types: Marijuana    Comment: marijuana every day  . Sexual activity: Not on file  Other Topics Concern  . Not on file  Social History Narrative  . Not on file   Social Determinants of Health   Financial Resource Strain: Low Risk   . Difficulty of Paying Living Expenses: Not hard at all  Food Insecurity: No Food Insecurity  . Worried About Charity fundraiser in the Last Year: Never true  . Ran Out  of Food in the Last Year: Never true  Transportation Needs: No Transportation Needs  . Lack of Transportation (Medical): No  . Lack of Transportation (Non-Medical): No  Physical Activity: Not on file  Stress: No Stress Concern Present  . Feeling of Stress : Not at all  Social Connections: Unknown  . Frequency of Communication with Friends and Family: More than three times a week  . Frequency of Social Gatherings with Friends and Family: More than three times a week  . Attends Religious Services: Not on file  . Active Member of Clubs or Organizations: Not on file  . Attends Archivist Meetings: Not on file  . Marital Status: Not on file    Outpatient Encounter Medications as of 05/11/2020   Medication Sig  . amLODipine (NORVASC) 5 MG tablet TAKE 1 TABLET BY MOUTH EVERY DAY  . Cyanocobalamin (VITAMIN B 12 PO) Take by mouth.  . levothyroxine (SYNTHROID) 50 MCG tablet TAKE 1 TABLET BY MOUTH EVERY DAY  . lisinopril (ZESTRIL) 40 MG tablet TAKE 1 TABLET BY MOUTH EVERY DAY  . rosuvastatin (CRESTOR) 10 MG tablet TAKE 1 TABLET BY MOUTH EVERY DAY   No facility-administered encounter medications on file as of 05/11/2020.    Review of Systems  Constitutional: Negative for appetite change and unexpected weight change.  HENT: Negative for congestion and sinus pressure.   Respiratory: Negative for cough, chest tightness and shortness of breath.   Cardiovascular: Negative for chest pain, palpitations and leg swelling.  Gastrointestinal: Negative for abdominal pain, diarrhea, nausea and vomiting.  Genitourinary: Negative for difficulty urinating and dysuria.  Musculoskeletal: Negative for joint swelling and myalgias.  Skin: Negative for color change and rash.  Neurological: Negative for dizziness, light-headedness and headaches.  Psychiatric/Behavioral: Negative for agitation and dysphoric mood.       Objective:    Physical Exam Vitals reviewed.  Constitutional:      General: She is not in acute distress.    Appearance: Normal appearance. She is well-developed and well-nourished.  HENT:     Head: Normocephalic and atraumatic.     Right Ear: External ear normal.     Left Ear: External ear normal.     Mouth/Throat:     Mouth: Oropharynx is clear and moist.  Eyes:     General: No scleral icterus.       Right eye: No discharge.        Left eye: No discharge.     Conjunctiva/sclera: Conjunctivae normal.  Neck:     Thyroid: No thyromegaly.  Cardiovascular:     Rate and Rhythm: Normal rate and regular rhythm.  Pulmonary:     Effort: No tachypnea, accessory muscle usage or respiratory distress.     Breath sounds: Normal breath sounds. No decreased breath sounds or wheezing.   Chest:  Breasts:     Right: No inverted nipple, mass, nipple discharge or tenderness (no axillary adenopathy).     Left: No inverted nipple, mass, nipple discharge or tenderness (no axilarry adenopathy).    Abdominal:     General: Bowel sounds are normal.     Palpations: Abdomen is soft.     Tenderness: There is no abdominal tenderness.  Musculoskeletal:        General: No swelling, tenderness or edema.     Cervical back: Neck supple. No tenderness.  Lymphadenopathy:     Cervical: No cervical adenopathy.  Skin:    Findings: No erythema or rash.  Neurological:     Mental  Status: She is alert and oriented to person, place, and time.  Psychiatric:        Mood and Affect: Mood and affect and mood normal.        Behavior: Behavior normal.     BP 112/60   Pulse 62   Temp 98.5 F (36.9 C) (Oral)   Resp 16   Ht 5\' 3"  (1.6 m)   Wt 125 lb (56.7 kg)   SpO2 99%   BMI 22.14 kg/m  Wt Readings from Last 3 Encounters:  05/11/20 125 lb (56.7 kg)  03/18/20 126 lb (57.2 kg)  01/08/20 126 lb (57.2 kg)     Lab Results  Component Value Date   WBC 4.7 05/11/2020   HGB 11.9 (L) 05/11/2020   HCT 35.7 (L) 05/11/2020   PLT 206.0 05/11/2020   GLUCOSE 77 05/11/2020   CHOL 143 05/11/2020   TRIG 68.0 05/11/2020   HDL 73.40 05/11/2020   LDLCALC 56 05/11/2020   ALT 13 05/11/2020   AST 19 05/11/2020   NA 137 05/11/2020   K 4.2 05/11/2020   CL 102 05/11/2020   CREATININE 0.98 05/11/2020   BUN 12 05/11/2020   CO2 30 05/11/2020   TSH 1.98 01/08/2020    MM 3D SCREEN BREAST BILATERAL  Result Date: 02/05/2020 CLINICAL DATA:  Screening. EXAM: DIGITAL SCREENING BILATERAL MAMMOGRAM WITH TOMO AND CAD COMPARISON:  Previous exam(s). ACR Breast Density Category c: The breast tissue is heterogeneously dense, which may obscure small masses. FINDINGS: There are no findings suspicious for malignancy. Images were processed with CAD. IMPRESSION: No mammographic evidence of malignancy. A result letter  of this screening mammogram will be mailed directly to the patient. RECOMMENDATION: Screening mammogram in one year. (Code:SM-B-01Y) BI-RADS CATEGORY  1: Negative. Electronically Signed   By: Lillia Mountain M.D.   On: 02/05/2020 12:42       Assessment & Plan:   Problem List Items Addressed This Visit    Anemia    Recheck cbc today with labs.       Essential hypertension, benign    Blood pressure doing well on amlodipine and lisinopril. Continue.  Follow pressures.  Check metabolic panel today.        Relevant Orders   CBC with Differential/Platelet (Completed)   Basic metabolic panel (Completed)   Health care maintenance    Physical today 05/11/20.  Mammogram 02/05/20 - birads I.  Scheduled next month for f/u mammogram.  Overdue (last 2019).  Recommended f/u in one year.       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  Recommended f/u colonoscopy in one year.  Has f/u planned next month with GI.       Hypercholesterolemia    On crestor.  Follow lipid panel and liver function tests.  Check today.       Relevant Orders   Hepatic function panel (Completed)   Lipid panel (Completed)   Hypothyroidism    On thyroid replacement.  Follow tsh.        Other Visit Diagnoses    Routine general medical examination at a health care facility    -  Primary   Need for hepatitis C screening test           Einar Pheasant, MD

## 2020-05-11 NOTE — Progress Notes (Signed)
Order placed for f/u labs.  

## 2020-05-11 NOTE — Assessment & Plan Note (Signed)
On crestor.  Follow lipid panel and liver function tests.  Check today.

## 2020-05-11 NOTE — Assessment & Plan Note (Signed)
On thyroid replacement.  Follow tsh.  

## 2020-05-11 NOTE — Assessment & Plan Note (Signed)
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  Recommended f/u colonoscopy in one year.  Has f/u planned next month with GI.

## 2020-05-11 NOTE — Assessment & Plan Note (Signed)
Recheck cbc today with labs.

## 2020-05-11 NOTE — Assessment & Plan Note (Addendum)
Physical today 05/11/20.  Mammogram 02/05/20 - birads I.  Scheduled next month for f/u mammogram.  Overdue (last 2019).  Recommended f/u in one year.

## 2020-05-11 NOTE — Assessment & Plan Note (Signed)
Blood pressure doing well on amlodipine and lisinopril. Continue.  Follow pressures.  Check metabolic panel today.

## 2020-05-12 ENCOUNTER — Telehealth: Payer: Self-pay

## 2020-05-12 LAB — HEPATITIS C ANTIBODY
Hepatitis C Ab: NONREACTIVE
SIGNAL TO CUT-OFF: 0.03 (ref <1.00)

## 2020-05-12 NOTE — Telephone Encounter (Signed)
Pt returned your call for lab results.  

## 2020-05-16 NOTE — Telephone Encounter (Signed)
See result note.  

## 2020-05-25 ENCOUNTER — Other Ambulatory Visit: Payer: Self-pay | Admitting: Internal Medicine

## 2020-06-29 ENCOUNTER — Other Ambulatory Visit (INDEPENDENT_AMBULATORY_CARE_PROVIDER_SITE_OTHER): Payer: Medicare Other

## 2020-06-29 ENCOUNTER — Other Ambulatory Visit: Payer: Self-pay

## 2020-06-29 DIAGNOSIS — D649 Anemia, unspecified: Secondary | ICD-10-CM

## 2020-06-29 LAB — CBC WITH DIFFERENTIAL/PLATELET
Basophils Absolute: 0 10*3/uL (ref 0.0–0.1)
Basophils Relative: 0.5 % (ref 0.0–3.0)
Eosinophils Absolute: 0.2 10*3/uL (ref 0.0–0.7)
Eosinophils Relative: 3.3 % (ref 0.0–5.0)
HCT: 32.6 % — ABNORMAL LOW (ref 36.0–46.0)
Hemoglobin: 11.3 g/dL — ABNORMAL LOW (ref 12.0–15.0)
Lymphocytes Relative: 44.4 % (ref 12.0–46.0)
Lymphs Abs: 2.2 10*3/uL (ref 0.7–4.0)
MCHC: 34.5 g/dL (ref 30.0–36.0)
MCV: 96.9 fl (ref 78.0–100.0)
Monocytes Absolute: 0.3 10*3/uL (ref 0.1–1.0)
Monocytes Relative: 6.5 % (ref 3.0–12.0)
Neutro Abs: 2.2 10*3/uL (ref 1.4–7.7)
Neutrophils Relative %: 45.3 % (ref 43.0–77.0)
Platelets: 202 10*3/uL (ref 150.0–400.0)
RBC: 3.37 Mil/uL — ABNORMAL LOW (ref 3.87–5.11)
RDW: 14.4 % (ref 11.5–15.5)
WBC: 4.9 10*3/uL (ref 4.0–10.5)

## 2020-06-29 LAB — IBC + FERRITIN
Ferritin: 35.6 ng/mL (ref 10.0–291.0)
Iron: 61 ug/dL (ref 42–145)
Saturation Ratios: 21.2 % (ref 20.0–50.0)
Transferrin: 206 mg/dL — ABNORMAL LOW (ref 212.0–360.0)

## 2020-06-29 LAB — VITAMIN B12: Vitamin B-12: 1005 pg/mL — ABNORMAL HIGH (ref 211–911)

## 2020-06-30 ENCOUNTER — Other Ambulatory Visit: Payer: Self-pay | Admitting: Internal Medicine

## 2020-06-30 DIAGNOSIS — D649 Anemia, unspecified: Secondary | ICD-10-CM

## 2020-06-30 NOTE — Progress Notes (Signed)
Order placed for f/u cbc.   

## 2020-08-01 ENCOUNTER — Other Ambulatory Visit (INDEPENDENT_AMBULATORY_CARE_PROVIDER_SITE_OTHER): Payer: Medicare Other

## 2020-08-01 ENCOUNTER — Other Ambulatory Visit: Payer: Self-pay

## 2020-08-01 DIAGNOSIS — D649 Anemia, unspecified: Secondary | ICD-10-CM

## 2020-08-02 LAB — CBC WITH DIFFERENTIAL/PLATELET
Basophils Absolute: 0.1 10*3/uL (ref 0.0–0.1)
Basophils Relative: 1.1 % (ref 0.0–3.0)
Eosinophils Absolute: 0.1 10*3/uL (ref 0.0–0.7)
Eosinophils Relative: 1.1 % (ref 0.0–5.0)
HCT: 34.7 % — ABNORMAL LOW (ref 36.0–46.0)
Hemoglobin: 11.8 g/dL — ABNORMAL LOW (ref 12.0–15.0)
Lymphocytes Relative: 44.5 % (ref 12.0–46.0)
Lymphs Abs: 2.7 10*3/uL (ref 0.7–4.0)
MCHC: 33.9 g/dL (ref 30.0–36.0)
MCV: 98 fl (ref 78.0–100.0)
Monocytes Absolute: 0.3 10*3/uL (ref 0.1–1.0)
Monocytes Relative: 4.7 % (ref 3.0–12.0)
Neutro Abs: 2.9 10*3/uL (ref 1.4–7.7)
Neutrophils Relative %: 48.6 % (ref 43.0–77.0)
Platelets: 220 10*3/uL (ref 150.0–400.0)
RBC: 3.55 Mil/uL — ABNORMAL LOW (ref 3.87–5.11)
RDW: 14.7 % (ref 11.5–15.5)
WBC: 6.1 10*3/uL (ref 4.0–10.5)

## 2020-08-19 ENCOUNTER — Other Ambulatory Visit: Payer: Self-pay | Admitting: Internal Medicine

## 2020-09-14 ENCOUNTER — Other Ambulatory Visit: Payer: Self-pay

## 2020-09-14 ENCOUNTER — Ambulatory Visit (INDEPENDENT_AMBULATORY_CARE_PROVIDER_SITE_OTHER): Payer: Medicare Other | Admitting: Internal Medicine

## 2020-09-14 VITALS — BP 120/62 | HR 68 | Temp 98.2°F | Resp 16 | Ht 63.0 in | Wt 121.8 lb

## 2020-09-14 DIAGNOSIS — Z860100 Personal history of colon polyps, unspecified: Secondary | ICD-10-CM

## 2020-09-14 DIAGNOSIS — E7800 Pure hypercholesterolemia, unspecified: Secondary | ICD-10-CM

## 2020-09-14 DIAGNOSIS — D649 Anemia, unspecified: Secondary | ICD-10-CM | POA: Diagnosis not present

## 2020-09-14 DIAGNOSIS — E039 Hypothyroidism, unspecified: Secondary | ICD-10-CM

## 2020-09-14 DIAGNOSIS — R634 Abnormal weight loss: Secondary | ICD-10-CM

## 2020-09-14 DIAGNOSIS — I10 Essential (primary) hypertension: Secondary | ICD-10-CM

## 2020-09-14 LAB — HEPATIC FUNCTION PANEL
ALT: 14 U/L (ref 0–35)
AST: 19 U/L (ref 0–37)
Albumin: 4.2 g/dL (ref 3.5–5.2)
Alkaline Phosphatase: 82 U/L (ref 39–117)
Bilirubin, Direct: 0.1 mg/dL (ref 0.0–0.3)
Total Bilirubin: 0.5 mg/dL (ref 0.2–1.2)
Total Protein: 6.8 g/dL (ref 6.0–8.3)

## 2020-09-14 LAB — CBC WITH DIFFERENTIAL/PLATELET
Basophils Absolute: 0 10*3/uL (ref 0.0–0.1)
Basophils Relative: 0.5 % (ref 0.0–3.0)
Eosinophils Absolute: 0.1 10*3/uL (ref 0.0–0.7)
Eosinophils Relative: 1.5 % (ref 0.0–5.0)
HCT: 37.1 % (ref 36.0–46.0)
Hemoglobin: 12.5 g/dL (ref 12.0–15.0)
Lymphocytes Relative: 38.3 % (ref 12.0–46.0)
Lymphs Abs: 2.7 10*3/uL (ref 0.7–4.0)
MCHC: 33.6 g/dL (ref 30.0–36.0)
MCV: 97.8 fl (ref 78.0–100.0)
Monocytes Absolute: 0.4 10*3/uL (ref 0.1–1.0)
Monocytes Relative: 5.3 % (ref 3.0–12.0)
Neutro Abs: 3.8 10*3/uL (ref 1.4–7.7)
Neutrophils Relative %: 54.4 % (ref 43.0–77.0)
Platelets: 213 10*3/uL (ref 150.0–400.0)
RBC: 3.79 Mil/uL — ABNORMAL LOW (ref 3.87–5.11)
RDW: 14.7 % (ref 11.5–15.5)
WBC: 7 10*3/uL (ref 4.0–10.5)

## 2020-09-14 LAB — LIPID PANEL
Cholesterol: 156 mg/dL (ref 0–200)
HDL: 84.6 mg/dL (ref >39.00)
LDL Cholesterol: 58 mg/dL (ref 0–99)
NonHDL: 71.3
Total CHOL/HDL Ratio: 2
Triglycerides: 68 mg/dL (ref 0.0–149.0)
VLDL: 13.6 mg/dL (ref 0.0–40.0)

## 2020-09-14 LAB — BASIC METABOLIC PANEL
BUN: 13 mg/dL (ref 6–23)
CO2: 29 mEq/L (ref 19–32)
Calcium: 9.4 mg/dL (ref 8.4–10.5)
Chloride: 105 mEq/L (ref 96–112)
Creatinine, Ser: 1.1 mg/dL (ref 0.40–1.20)
GFR: 49.95 mL/min — ABNORMAL LOW (ref >60.00)
Glucose, Bld: 96 mg/dL (ref 70–99)
Potassium: 4.5 mEq/L (ref 3.5–5.1)
Sodium: 141 mEq/L (ref 135–145)

## 2020-09-14 LAB — FERRITIN: Ferritin: 41.9 ng/mL (ref 10.0–291.0)

## 2020-09-14 LAB — TSH: TSH: 4.01 u[IU]/mL (ref 0.35–4.50)

## 2020-09-14 NOTE — Patient Instructions (Signed)
Decrease amlodipine to 5mg   - 1/2 tablet per day.

## 2020-09-14 NOTE — Progress Notes (Signed)
[name], female   DOB: Jan 01, 1946, 75 y.o.   MRN: [id number]   Subjective:    [name], female    DOB: 11/14/1945, 75 y.o.   MRN: [id number]  HPI This visit occurred during the SARS-CoV-2 public health emergency.  Safety protocols were in place, including screening questions prior to the visit, additional usage of staff PPE, and extensive cleaning of exam room while observing appropriate contact time as indicated for disinfecting solutions.  Patient here for a scheduled follow up. Here to follow up regarding her blood pressure.  Also concerned regarding weight loss.  She is concerned that the last blood pressure medication has led to her weight loss. She denies any nausea or vomiting.  States she is eating.  No abdominal pain.  Bowels moving.  No chest pain or sob.  No cough or congestion.  Denies increased alcohol intake.  Overall feels good.  Handling stress.     Past Medical History:  Diagnosis Date  . GERD (gastroesophageal reflux disease)   . Goiter    s/p thyroidectomy  . H/O syncope   . Hypercholesterolemia   . Hypothyroidism   . Recurrent sinus infections    and allergy problems   Past Surgical History:  Procedure Laterality Date  . BREAST BIOPSY    . COLONOSCOPY WITH PROPOFOL N/A 11/03/2014   Procedure: COLONOSCOPY WITH PROPOFOL;  Surgeon: Manya Silvas, MD;  Location: [facility] ENDOSCOPY;  Service: Endoscopy;  Laterality: N/A;  . COLONOSCOPY WITH PROPOFOL N/A 02/12/2018   Procedure: COLONOSCOPY WITH PROPOFOL;  Surgeon: Manya Silvas, MD;  Location: [facility] ENDOSCOPY;  Service: Endoscopy;  Laterality: N/A;  . [place]  . ESOPHAGOGASTRODUODENOSCOPY N/A 11/03/2014   Procedure: ESOPHAGOGASTRODUODENOSCOPY (EGD);  Surgeon: Manya Silvas, MD;  Location: [facility] ENDOSCOPY;  Service: Endoscopy;  Laterality: N/A;  . LAPAROSCOPIC APPENDECTOMY N/A 11/26/2014   Procedure: APPENDECTOMY LAPAROSCOPIC;  Surgeon: Robert Bellow, MD;  Location: [facility] ORS;  Service: General;  Laterality: N/A;  . thyroidectomy  1979   goiter  . TUBAL LIGATION  1975   Family History  Problem Relation Age of Onset  . Tuberculosis Paternal Grandfather   . Alzheimer's disease Mother   . Goiter Mother   . Cancer Brother   . Cancer Brother   . Cancer Brother   . Cancer Brother    Social History   Socioeconomic History  . Marital status: Married    Spouse name: Not on file  . Number of children: Not on file  . Years of education: Not on file  . Highest education level: Not on file  Occupational History  . Not on file  Tobacco Use  . Smoking status: Former Smoker    Types: Cigarettes    Quit date: 04/16/2009    Years since quitting: 11.4  . Smokeless tobacco: Never Used  Substance and Sexual Activity  . Alcohol use: Yes    Alcohol/week: 3.0 - 4.0 standard drinks    Types: 3 - 4 Standard drinks or equivalent per week    Comment: occ  . Drug use: Yes    Types: Marijuana    Comment: marijuana every day  . Sexual activity: Not on file  Other Topics Concern  . Not on file  Social History Narrative  . Not on file   Social Determinants of Health   Financial Resource Strain: Low Risk   . Difficulty of Paying Living Expenses: Not hard at  all  Food Insecurity: No Food Insecurity  . Worried About Charity fundraiser in the Last Year: Never true  . Ran Out of Food in the Last Year: Never true  Transportation Needs: No Transportation Needs  . Lack of Transportation (Medical): No  . Lack of Transportation (Non-Medical): No  Physical Activity: Not on file  Stress: No Stress Concern Present  . Feeling of Stress : Not at all  Social Connections: Unknown  . Frequency of Communication with Friends and Family: More than three times a week  . Frequency of Social Gatherings with Friends and Family: More than three times a week  . Attends Religious Services: Not on file  . Active Member of Clubs or Organizations: Not on  file  . Attends Archivist Meetings: Not on file  . Marital Status: Not on file    Outpatient Encounter Medications as of 09/14/2020  Medication Sig  . amLODipine (NORVASC) 5 MG tablet TAKE 1 TABLET BY MOUTH EVERY DAY  . cyanocobalamin 1000 MCG tablet Take 1,000 mcg by mouth daily.  Marland Kitchen levothyroxine (SYNTHROID) 50 MCG tablet TAKE 1 TABLET BY MOUTH EVERY DAY  . lisinopril (ZESTRIL) 40 MG tablet TAKE 1 TABLET BY MOUTH EVERY DAY  . rosuvastatin (CRESTOR) 10 MG tablet TAKE 1 TABLET BY MOUTH EVERY DAY  . [DISCONTINUED] Cyanocobalamin (VITAMIN B 12 PO) Take by mouth.   No facility-administered encounter medications on file as of 09/14/2020.    Review of Systems  Constitutional: Negative for appetite change and unexpected weight change.       Weight loss as outlined.   HENT: Negative for congestion and sinus pressure.   Respiratory: Negative for cough, chest tightness and shortness of breath.   Cardiovascular: Negative for chest pain, palpitations and leg swelling.  Gastrointestinal: Negative for abdominal pain, diarrhea, nausea and vomiting.  Genitourinary: Negative for difficulty urinating and dysuria.  Musculoskeletal: Negative for joint swelling and myalgias.  Skin: Negative for color change and rash.  Neurological: Negative for dizziness, light-headedness and headaches.  Psychiatric/Behavioral: Negative for agitation and dysphoric mood.       Objective:    Physical Exam Vitals reviewed.  Constitutional:      General: She is not in acute distress.    Appearance: Normal appearance.  HENT:     Head: Normocephalic and atraumatic.     Right Ear: External ear normal.     Left Ear: External ear normal.  Eyes:     General: No scleral icterus.       Right eye: No discharge.        Left eye: No discharge.     Conjunctiva/sclera: Conjunctivae normal.  Neck:     Thyroid: No thyromegaly.  Cardiovascular:     Rate and Rhythm: Normal rate and regular rhythm.  Pulmonary:      Effort: No respiratory distress.     Breath sounds: Normal breath sounds. No wheezing.  Abdominal:     General: Bowel sounds are normal.     Palpations: Abdomen is soft.     Tenderness: There is no abdominal tenderness.  Musculoskeletal:        General: No swelling or tenderness.     Cervical back: Neck supple. No tenderness.  Lymphadenopathy:     Cervical: No cervical adenopathy.  Skin:    Findings: No erythema or rash.  Neurological:     Mental Status: She is alert.  Psychiatric:        Mood and Affect: Mood normal.  Behavior: Behavior normal.     BP 120/62   Pulse 68   Temp 98.2 F (36.8 C)   Resp 16   Ht $R'5\' 3"'Hy$  (1.6 m)   Wt 121 lb 12.8 oz (55.2 kg)   SpO2 98%   BMI 21.58 kg/m  Wt Readings from Last 3 Encounters:  09/14/20 121 lb 12.8 oz (55.2 kg)  05/11/20 125 lb (56.7 kg)  03/18/20 126 lb (57.2 kg)     Lab Results  Component Value Date   WBC 7.0 09/14/2020   HGB 12.5 09/14/2020   HCT 37.1 09/14/2020   PLT 213.0 09/14/2020   GLUCOSE 96 09/14/2020   CHOL 156 09/14/2020   TRIG 68.0 09/14/2020   HDL 84.60 09/14/2020   LDLCALC 58 09/14/2020   ALT 14 09/14/2020   AST 19 09/14/2020   NA 141 09/14/2020   K 4.5 09/14/2020   CL 105 09/14/2020   CREATININE 1.10 09/14/2020   BUN 13 09/14/2020   CO2 29 09/14/2020   TSH 4.01 09/14/2020    MM 3D SCREEN BREAST BILATERAL  Result Date: 02/05/2020 CLINICAL DATA:  Screening. EXAM: DIGITAL SCREENING BILATERAL MAMMOGRAM WITH TOMO AND CAD COMPARISON:  Previous exam(s). ACR Breast Density Category c: The breast tissue is heterogeneously dense, which may obscure small masses. FINDINGS: There are no findings suspicious for malignancy. Images were processed with CAD. IMPRESSION: No mammographic evidence of malignancy. A result letter of this screening mammogram will be mailed directly to the patient. RECOMMENDATION: Screening mammogram in one year. (Code:SM-B-01Y) BI-RADS CATEGORY  1: Negative. Electronically Signed    By: Lillia Mountain M.D.   On: 02/05/2020 12:42       Assessment & Plan:   Problem List Items Addressed This Visit    Anemia    Follow cbc.       Relevant Medications   cyanocobalamin 1000 MCG tablet   Other Relevant Orders   Ferritin (Completed)   Essential hypertension, benign - Primary    Blood pressure doing well.  Currently on amlodipine and lisinopril.  Feels the amlodipine is contributing to her weight loss.  Discussed other possibilities.  Discussed doubt related to amlodipine.  She wants to adjust medication and see if weight improves.  Will decrease amlodipine to 2.$RemoveBeforeD'5mg'HgaQAbkRQTsuQP$  q day.  Continue lisinopril at current dose.  Follow pressures.  Follow metabolic panel.       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  Recommended f/u colonoscopy in one year.  Saw GI 06/2020.  Scheduled for f/u colonoscopy.       Hypercholesterolemia    Continue crestor.  Follow lipid panel and liver function tests.        Relevant Orders   Hepatic function panel (Completed)   Lipid panel (Completed)   Hypothyroidism    On thyroid replacement.  Follow tsh.       Weight loss    Weight loss as outlined.  She feels is related to last blood pressure medication.  Discussed other possible etiologies.  Can adjust medication - change amlodipine to 2.$RemoveBeforeD'5mg'iQqAcLBneexUbU$  q day.  Check routine labs, including cbc, met c and tsh.  Further w/up pending results.  Scheduled for colonoscopy.            Einar Pheasant, MD

## 2020-09-19 ENCOUNTER — Encounter: Payer: Self-pay | Admitting: Internal Medicine

## 2020-09-19 DIAGNOSIS — R634 Abnormal weight loss: Secondary | ICD-10-CM | POA: Insufficient documentation

## 2020-09-19 NOTE — Assessment & Plan Note (Signed)
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  Recommended f/u colonoscopy in one year.  Saw GI 06/2020.  Scheduled for f/u colonoscopy.

## 2020-09-19 NOTE — Assessment & Plan Note (Signed)
Follow cbc.  

## 2020-09-19 NOTE — Assessment & Plan Note (Signed)
Blood pressure doing well.  Currently on amlodipine and lisinopril.  Feels the amlodipine is contributing to her weight loss.  Discussed other possibilities.  Discussed doubt related to amlodipine.  She wants to adjust medication and see if weight improves.  Will decrease amlodipine to 2.5mg  q day.  Continue lisinopril at current dose.  Follow pressures.  Follow metabolic panel.

## 2020-09-19 NOTE — Assessment & Plan Note (Signed)
Continue crestor.  Follow lipid panel and liver function tests.  

## 2020-09-19 NOTE — Assessment & Plan Note (Signed)
Weight loss as outlined.  She feels is related to last blood pressure medication.  Discussed other possible etiologies.  Can adjust medication - change amlodipine to 2.$RemoveBeforeD'5mg'grNelQOSqmfhjB$  q day.  Check routine labs, including cbc, met c and tsh.  Further w/up pending results.  Scheduled for colonoscopy.

## 2020-09-19 NOTE — Assessment & Plan Note (Signed)
On thyroid replacement.  Follow tsh.  

## 2020-10-20 ENCOUNTER — Other Ambulatory Visit: Payer: Self-pay | Admitting: Internal Medicine

## 2020-11-16 ENCOUNTER — Other Ambulatory Visit: Payer: Self-pay

## 2020-11-16 ENCOUNTER — Encounter: Payer: Self-pay | Admitting: Internal Medicine

## 2020-11-16 ENCOUNTER — Ambulatory Visit (INDEPENDENT_AMBULATORY_CARE_PROVIDER_SITE_OTHER): Payer: Medicare Other | Admitting: Internal Medicine

## 2020-11-16 DIAGNOSIS — E7800 Pure hypercholesterolemia, unspecified: Secondary | ICD-10-CM | POA: Diagnosis not present

## 2020-11-16 DIAGNOSIS — R634 Abnormal weight loss: Secondary | ICD-10-CM

## 2020-11-16 DIAGNOSIS — D649 Anemia, unspecified: Secondary | ICD-10-CM

## 2020-11-16 DIAGNOSIS — Z86010 Personal history of colonic polyps: Secondary | ICD-10-CM

## 2020-11-16 DIAGNOSIS — E039 Hypothyroidism, unspecified: Secondary | ICD-10-CM

## 2020-11-16 DIAGNOSIS — I10 Essential (primary) hypertension: Secondary | ICD-10-CM

## 2020-11-16 NOTE — Progress Notes (Signed)
Subjective:    [name], female    DOB: [birth year], 75 y.o.   MRN: [id number]  HPI This visit occurred during the SARS-CoV-2 public health emergency.  Safety protocols were in place, including screening questions prior to the visit, additional usage of staff PPE, and extensive cleaning of exam room while observing appropriate contact time as indicated for disinfecting solutions.   Patient here for a scheduled follow up.  Here to follow up regarding weight loss.  She recently had started on amlodipine.  Felt it was contributing to her weight loss.  We decreased her dose to 2.'5mg'$  q day.  She feels good. Eating well.  No nausea or vomiting.  No abdominal pain.  [place]les moving.  Has noticed a "knot" on her left hand.  No pain.  Noticed a few weeks ago.  No injury.    Past Medical History:  Diagnosis Date   GERD (gastroesophageal reflux disease)    Goiter    s/p thyroidectomy   H/O syncope    Hypercholesterolemia    Hypothyroidism    Recurrent sinus infections    and allergy problems   Past Surgical History:  Procedure Laterality Date   BREAST BIOPSY     COLONOSCOPY WITH PROPOFOL N/A 11/03/2014   Procedure: COLONOSCOPY WITH PROPOFOL;  Surgeon: Manya Silvas, MD;  Location: [facility]  END[facility]COPY;  Service: Endoscopy;  Laterality: N/A;   COLONOSCOPY WITH PROPOFOL N/A 02/12/2018   Procedure: COLONOSCOPY WITH PROPOFOL;  Surgeon: Manya Silvas, MD;  Location: [facility] ENDOSCOPY;  Service: Endoscopy;  Laterality: N/A;   [place]   ESOPHAGOGASTRODUODENOSCOPY N/A 11/03/2014   Procedure: ESOPHAGOGASTRODUODENOSCOPY (EGD);  Surgeon: Manya Silvas, MD;  Location: [facility] ENDOSCOPY;  Service: Endoscopy;  Laterality: N/A;   LAPAROSCOPIC APPENDECTOMY N/A 11/26/2014   Procedure: APPENDECTOMY LAPAROSCOPIC;  Surgeon: Robert Bellow, MD;  Location: [facility] ORS;  Service: General;  Laterality: N/A;   thyroidectomy  1979   goiter   TUBAL LIGATION  1975    Family History  Problem Relation Age of Onset   Tuberculosis Paternal Grandfather    Alzheimer's disease Mother    Goiter Mother    Cancer Brother    Cancer Brother    Cancer Brother    Cancer Brother    Social History   Socioeconomic History   Marital status: Married    Spouse name: Not on file   Number of children: Not on file   Years of education: Not on file   Highest education level: Not on file  Occupational History   Not on file  Tobacco Use   Smoking status: Former    Types: Cigarettes    Quit date: 04/16/2009    Years since quitting: 11.6   Smokeless tobacco: Never  Substance and Sexual Activity   Alcohol use: Yes    Alcohol/week: 3.0 - 4.0 standard drinks    Types: 3 - 4 Standard drinks or equivalent per week    Comment: occ   Drug use: Yes    Types: Marijuana    Comment: marijuana every day   Sexual activity: Not on file  Other Topics Concern   Not on file  Social History Narrative   Not on file   Social Determinants of Health   Financial Resource Strain: Low Risk    Difficulty of Paying Living Expenses: Not hard at all  Food Insecurity: No Food Insecurity   Worried About Charity fundraiser in the Last  Year: Never true   [place] in the Last Year: Never true  Transportation Needs: No Transportation Needs   Lack of Transportation (Medical): No   Lack of Transportation (Non-Medical): No  Physical Activity: Not on file  Stress: No Stress Concern Present   Feeling of Stress : Not at all  Social Connections: Unknown   Frequency of Communication with Friends and Family: More than three times a week   Frequency of Social Gatherings with Friends and Family: More than three times a week   Attends Religious Services: Not on file   Active Member of Clubs or Organizations: Not on file   Attends Archivist Meetings: Not on file   Marital Status: Not on file    Review of Systems  Constitutional:  Negative for appetite change and  unexpected weight change.  HENT:  Negative for congestion and sinus pressure.   Respiratory:  Negative for cough, chest tightness and shortness of breath.   Cardiovascular:  Negative for chest pain, palpitations and leg swelling.  Gastrointestinal:  Negative for abdominal pain, diarrhea, nausea and vomiting.  Genitourinary:  Negative for difficulty urinating and dysuria.  Musculoskeletal:  Negative for joint swelling and myalgias.  Skin:  Negative for color change and rash.  Neurological:  Negative for dizziness, light-headedness and headaches.  Psychiatric/Behavioral:  Negative for agitation and dysphoric mood.       Objective:    Physical Exam Vitals reviewed.  Constitutional:      General: She is not in acute distress.    Appearance: Normal appearance.  HENT:     Head: Normocephalic and atraumatic.     Right Ear: External ear normal.     Left Ear: External ear normal.  Eyes:     General: No scleral icterus.       Right eye: No discharge.        Left eye: No discharge.     Conjunctiva/sclera: Conjunctivae normal.  Neck:     Thyroid: No thyromegaly.  Cardiovascular:     Rate and Rhythm: Normal rate and regular rhythm.  Pulmonary:     Effort: No respiratory distress.     Breath sounds: Normal breath sounds. No wheezing.  Abdominal:     General: Bowel sounds are normal.     Palpations: Abdomen is soft.     Tenderness: There is no abdominal tenderness.  Musculoskeletal:        General: No swelling or tenderness.     Cervical back: Neck supple. No tenderness.     Comments: Ganglion cyst - left hand.    Lymphadenopathy:     Cervical: No cervical adenopathy.  Skin:    Findings: No erythema or rash.  Neurological:     Mental Status: She is alert.  Psychiatric:        Mood and Affect: Mood normal.        Behavior: Behavior normal.    BP 124/70   Pulse 93   Temp (!) 96.1 F (35.6 C)   Ht 5' 2.99" (1.6 m)   Wt 125 lb 6.4 oz (56.9 kg)   SpO2 96%   BMI 22.22 kg/m   Wt Readings from Last 3 Encounters:  11/16/20 125 lb 6.4 oz (56.9 kg)  09/14/20 121 lb 12.8 oz (55.2 kg)  05/11/20 125 lb (56.7 kg)    Outpatient Encounter Medications as of 11/16/2020  Medication Sig   amLODipine (NORVASC) 5 MG tablet TAKE 1 TABLET BY MOUTH EVERY DAY   cyanocobalamin  1000 MCG tablet Take 1,000 mcg by mouth daily.   levothyroxine (SYNTHROID) 50 MCG tablet TAKE 1 TABLET BY MOUTH EVERY DAY   lisinopril (ZESTRIL) 40 MG tablet TAKE 1 TABLET BY MOUTH EVERY DAY   rosuvastatin (CRESTOR) 10 MG tablet TAKE 1 TABLET BY MOUTH EVERY DAY   No facility-administered encounter medications on file as of 11/16/2020.     Lab Results  Component Value Date   WBC 7.0 09/14/2020   HGB 12.5 09/14/2020   HCT 37.1 09/14/2020   PLT 213.0 09/14/2020   GLUCOSE 96 09/14/2020   CHOL 156 09/14/2020   TRIG 68.0 09/14/2020   HDL 84.60 09/14/2020   LDLCALC 58 09/14/2020   ALT 14 09/14/2020   AST 19 09/14/2020   NA 141 09/14/2020   K 4.5 09/14/2020   CL 105 09/14/2020   CREATININE 1.10 09/14/2020   BUN 13 09/14/2020   CO2 29 09/14/2020   TSH 4.01 09/14/2020    MM 3D SCREEN BREAST BILATERAL  Result Date: 02/05/2020 CLINICAL DATA:  Screening. EXAM: DIGITAL SCREENING BILATERAL MAMMOGRAM WITH TOMO AND CAD COMPARISON:  Previous exam(s). ACR Breast Density Category c: The breast tissue is heterogeneously dense, which may obscure small masses. FINDINGS: There are no findings suspicious for malignancy. Images were processed with CAD. IMPRESSION: No mammographic evidence of malignancy. A result letter of this screening mammogram will be mailed directly to the patient. RECOMMENDATION: Screening mammogram in one year. (Code:SM-B-01Y) BI-RADS CATEGORY  1: Negative. Electronically Signed   By: Lillia Mountain M.D.   On: 02/05/2020 12:42       Assessment & Plan:   Problem List Items Addressed This Visit     Anemia    Follow cbc.        Relevant Orders   CBC with Differential/Platelet   Essential  hypertension, benign    Blood pressure doing well.  Continue amlodipine and lisinopril.  Follow pressures.  Follow metabolic panel.        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  Recommended f/u colonoscopy in one year.  Saw GI 06/2020.  Scheduled for f/u colonoscopy.        Hypercholesterolemia    Continue crestor.  Follow lipid panel and liver function tests.         Relevant Orders   Lipid panel   Hepatic function panel   Hypothyroidism    On thyroid replacement.  Follow tsh.        Relevant Orders   TSH   Weight loss    Good appetite.  Weight back up.  Follow.  No nausea or vomiting.  No bowel change.          Einar Pheasant, MD

## 2020-11-20 ENCOUNTER — Encounter: Payer: Self-pay | Admitting: Internal Medicine

## 2020-11-20 NOTE — Assessment & Plan Note (Signed)
On thyroid replacement.  Follow tsh.  

## 2020-11-20 NOTE — Assessment & Plan Note (Signed)
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  Recommended f/u colonoscopy in one year.  Saw GI 06/2020.  Scheduled for f/u colonoscopy.

## 2020-11-20 NOTE — Assessment & Plan Note (Signed)
Continue crestor.  Follow lipid panel and liver function tests.  

## 2020-11-20 NOTE — Assessment & Plan Note (Signed)
Blood pressure doing well.  Continue amlodipine and lisinopril.  Follow pressures.  Follow metabolic panel.

## 2020-11-20 NOTE — Assessment & Plan Note (Signed)
Follow cbc.  

## 2020-11-20 NOTE — Assessment & Plan Note (Signed)
Good appetite.  Weight back up.  Follow.  No nausea or vomiting.  No bowel change.

## 2021-02-11 ENCOUNTER — Other Ambulatory Visit: Payer: Self-pay | Admitting: Internal Medicine

## 2021-02-17 ENCOUNTER — Other Ambulatory Visit: Payer: Self-pay

## 2021-02-17 ENCOUNTER — Other Ambulatory Visit (INDEPENDENT_AMBULATORY_CARE_PROVIDER_SITE_OTHER): Payer: Medicare Other

## 2021-02-17 DIAGNOSIS — D649 Anemia, unspecified: Secondary | ICD-10-CM

## 2021-02-17 DIAGNOSIS — E7800 Pure hypercholesterolemia, unspecified: Secondary | ICD-10-CM | POA: Diagnosis not present

## 2021-02-17 DIAGNOSIS — E039 Hypothyroidism, unspecified: Secondary | ICD-10-CM

## 2021-02-17 DIAGNOSIS — I10 Essential (primary) hypertension: Secondary | ICD-10-CM | POA: Diagnosis not present

## 2021-02-17 LAB — HEPATIC FUNCTION PANEL
ALT: 5 U/L (ref 0–35)
AST: 11 U/L (ref 0–37)
Albumin: 3.2 g/dL — ABNORMAL LOW (ref 3.5–5.2)
Alkaline Phosphatase: 68 U/L (ref 39–117)
Bilirubin, Direct: 0.1 mg/dL (ref 0.0–0.3)
Total Bilirubin: 0.2 mg/dL (ref 0.2–1.2)
Total Protein: 6.5 g/dL (ref 6.0–8.3)

## 2021-02-17 LAB — CBC WITH DIFFERENTIAL/PLATELET
Basophils Absolute: 0 10*3/uL (ref 0.0–0.1)
Basophils Relative: 0.4 % (ref 0.0–3.0)
Eosinophils Absolute: 0.1 10*3/uL (ref 0.0–0.7)
Eosinophils Relative: 2 % (ref 0.0–5.0)
HCT: 27 % — ABNORMAL LOW (ref 36.0–46.0)
Hemoglobin: 9 g/dL — ABNORMAL LOW (ref 12.0–15.0)
Lymphocytes Relative: 36.1 % (ref 12.0–46.0)
Lymphs Abs: 2.3 10*3/uL (ref 0.7–4.0)
MCHC: 33.2 g/dL (ref 30.0–36.0)
MCV: 94.2 fl (ref 78.0–100.0)
Monocytes Absolute: 0.5 10*3/uL (ref 0.1–1.0)
Monocytes Relative: 7.4 % (ref 3.0–12.0)
Neutro Abs: 3.5 10*3/uL (ref 1.4–7.7)
Neutrophils Relative %: 54.1 % (ref 43.0–77.0)
Platelets: 320 10*3/uL (ref 150.0–400.0)
RBC: 2.87 Mil/uL — ABNORMAL LOW (ref 3.87–5.11)
RDW: 13.4 % (ref 11.5–15.5)
WBC: 6.4 10*3/uL (ref 4.0–10.5)

## 2021-02-17 LAB — BASIC METABOLIC PANEL
BUN: 13 mg/dL (ref 6–23)
CO2: 30 mEq/L (ref 19–32)
Calcium: 8.3 mg/dL — ABNORMAL LOW (ref 8.4–10.5)
Chloride: 104 mEq/L (ref 96–112)
Creatinine, Ser: 1.03 mg/dL (ref 0.40–1.20)
GFR: 53.89 mL/min — ABNORMAL LOW (ref >60.00)
Glucose, Bld: 107 mg/dL — ABNORMAL HIGH (ref 70–99)
Potassium: 4.6 mEq/L (ref 3.5–5.1)
Sodium: 139 mEq/L (ref 135–145)

## 2021-02-17 LAB — LIPID PANEL
Cholesterol: 111 mg/dL (ref 0–200)
HDL: 54.4 mg/dL (ref >39.00)
LDL Cholesterol: 48 mg/dL (ref 0–99)
NonHDL: 56.91
Total CHOL/HDL Ratio: 2
Triglycerides: 43 mg/dL (ref 0.0–149.0)
VLDL: 8.6 mg/dL (ref 0.0–40.0)

## 2021-02-17 LAB — TSH: TSH: 1.48 u[IU]/mL (ref 0.35–5.50)

## 2021-02-22 ENCOUNTER — Other Ambulatory Visit: Payer: Self-pay

## 2021-02-22 ENCOUNTER — Encounter: Payer: Self-pay | Admitting: Internal Medicine

## 2021-02-22 ENCOUNTER — Ambulatory Visit (INDEPENDENT_AMBULATORY_CARE_PROVIDER_SITE_OTHER): Payer: Medicare Other | Admitting: Internal Medicine

## 2021-02-22 VITALS — BP 160/90 | HR 76 | Temp 97.3°F | Resp 16 | Ht 63.0 in | Wt 125.8 lb

## 2021-02-22 DIAGNOSIS — I10 Essential (primary) hypertension: Secondary | ICD-10-CM | POA: Diagnosis not present

## 2021-02-22 DIAGNOSIS — F439 Reaction to severe stress, unspecified: Secondary | ICD-10-CM

## 2021-02-22 DIAGNOSIS — Z1231 Encounter for screening mammogram for malignant neoplasm of breast: Secondary | ICD-10-CM

## 2021-02-22 DIAGNOSIS — E039 Hypothyroidism, unspecified: Secondary | ICD-10-CM | POA: Diagnosis not present

## 2021-02-22 DIAGNOSIS — R519 Headache, unspecified: Secondary | ICD-10-CM

## 2021-02-22 DIAGNOSIS — E7800 Pure hypercholesterolemia, unspecified: Secondary | ICD-10-CM

## 2021-02-22 DIAGNOSIS — Z86010 Personal history of colonic polyps: Secondary | ICD-10-CM

## 2021-02-22 DIAGNOSIS — K209 Esophagitis, unspecified without bleeding: Secondary | ICD-10-CM

## 2021-02-22 DIAGNOSIS — D649 Anemia, unspecified: Secondary | ICD-10-CM | POA: Diagnosis not present

## 2021-02-22 LAB — CBC WITH DIFFERENTIAL/PLATELET
Basophils Absolute: 0 10*3/uL (ref 0.0–0.1)
Basophils Relative: 0.5 % (ref 0.0–3.0)
Eosinophils Absolute: 0.1 10*3/uL (ref 0.0–0.7)
Eosinophils Relative: 2.1 % (ref 0.0–5.0)
HCT: 30.8 % — ABNORMAL LOW (ref 36.0–46.0)
Hemoglobin: 10.2 g/dL — ABNORMAL LOW (ref 12.0–15.0)
Lymphocytes Relative: 34 % (ref 12.0–46.0)
Lymphs Abs: 2.2 10*3/uL (ref 0.7–4.0)
MCHC: 33.1 g/dL (ref 30.0–36.0)
MCV: 94.4 fl (ref 78.0–100.0)
Monocytes Absolute: 0.4 10*3/uL (ref 0.1–1.0)
Monocytes Relative: 5.7 % (ref 3.0–12.0)
Neutro Abs: 3.8 10*3/uL (ref 1.4–7.7)
Neutrophils Relative %: 57.7 % (ref 43.0–77.0)
Platelets: 371 10*3/uL (ref 150.0–400.0)
RBC: 3.27 Mil/uL — ABNORMAL LOW (ref 3.87–5.11)
RDW: 13.6 % (ref 11.5–15.5)
WBC: 6.5 10*3/uL (ref 4.0–10.5)

## 2021-02-22 LAB — BASIC METABOLIC PANEL
BUN: 12 mg/dL (ref 6–23)
CO2: 31 mEq/L (ref 19–32)
Calcium: 8.9 mg/dL (ref 8.4–10.5)
Chloride: 102 mEq/L (ref 96–112)
Creatinine, Ser: 0.97 mg/dL (ref 0.40–1.20)
GFR: 57.91 mL/min — ABNORMAL LOW (ref >60.00)
Glucose, Bld: 93 mg/dL (ref 70–99)
Potassium: 4.2 mEq/L (ref 3.5–5.1)
Sodium: 139 mEq/L (ref 135–145)

## 2021-02-22 LAB — SEDIMENTATION RATE: Sed Rate: 98 mm/hr — ABNORMAL HIGH (ref 0–30)

## 2021-02-22 LAB — VITAMIN B12: Vitamin B-12: 1316 pg/mL — ABNORMAL HIGH (ref 211–911)

## 2021-02-22 LAB — IBC + FERRITIN
Ferritin: 108.2 ng/mL (ref 10.0–291.0)
Iron: 27 ug/dL — ABNORMAL LOW (ref 42–145)
Saturation Ratios: 10.5 % — ABNORMAL LOW (ref 20.0–50.0)
TIBC: 257.6 ug/dL (ref 250.0–450.0)
Transferrin: 184 mg/dL — ABNORMAL LOW (ref 212.0–360.0)

## 2021-02-22 NOTE — Assessment & Plan Note (Signed)
Increased stress as outlined.  Discussed.  Is better now.  Does not feel needs any further intervention at this time.  Follow.

## 2021-02-22 NOTE — Assessment & Plan Note (Signed)
Continue crestor.  Follow lipid panel and liver function tests.   Lab Results  Component Value Date   CHOL 111 02/17/2021   HDL 54.40 02/17/2021   LDLCALC 48 02/17/2021   TRIG 43.0 02/17/2021   CHOLHDL 2 02/17/2021

## 2021-02-22 NOTE — Assessment & Plan Note (Signed)
On thyroid replacement.  Follow tsh.  

## 2021-02-22 NOTE — Assessment & Plan Note (Signed)
Hgb 9 on recent check - down from 12.  Unclear etiology.  She denies any bleeding.  Recheck cbc, iron studies and B12.  Just had colonoscopy as outlined.  No upper symptoms.  Some decrease appetite.  Weight is stable.  Further w/up pending above.  Denies increased alcohol intake.

## 2021-02-22 NOTE — Assessment & Plan Note (Signed)
Colonoscopy 09/2020 - recommended no f/u colonoscopy.

## 2021-02-22 NOTE — Progress Notes (Addendum)
[name], female   DOB: 1945/06/29, 75 y.o.   MRN: [id number]   Subjective:    [name], female    DOB: Jul 22, 1945, 75 y.o.   MRN: [id number]  This visit occurred during the SARS-CoV-2 public health emergency.  Safety protocols were in place, including screening questions prior to the visit, additional usage of staff PPE, and extensive cleaning of exam room while observing appropriate contact time as indicated for disinfecting solutions.   Patient here for a scheduled follow up.   Chief Complaint  Patient presents with   Stress   Headache   Hypertension   .   HPI Reports increased stress.  Had argument with her husband on 01/27/21.  States with this increased stress, she developed headache.  States since this day - has had a persistent headache.  Constant headache. Some days headaches better, but has been constant.  Described as throbbing.  No fever.  No rash.  No current vision change.  Headache is better now (3/10) - but has been persistent.  Bilateral headache.  Stress is persistent, but better.  Was only sleeping 2-3 hours, now sleeping better.  When headache began, she took an aspirin.  Had lip swelling.  Resolved after a few days and has not recurred.  Discussed not taking aspirin or antiinflammatories.  Denied any sob or feeling of throat closing.  Eating - sometimes only one meal per day.  No nausea or vomiting.  No diarrhea.  Handling stress.  Does not feel needs any further intervention at this time.  Recent labs - hgb - decreased.  Denies any bleeding or GI symptoms. No other bone pain.    Past Medical History:  Diagnosis Date   GERD (gastroesophageal reflux disease)    Goiter    s/p thyroidectomy   H/O syncope    Hypercholesterolemia    Hypothyroidism    Recurrent sinus infections    and allergy problems   Past Surgical History:  Procedure Laterality Date   BREAST BIOPSY     COLONOSCOPY WITH PROPOFOL N/A 11/03/2014   Procedure:  COLONOSCOPY WITH PROPOFOL;  Surgeon: Manya Silvas, MD;  Location: [facility] ENDOSCOPY;  Service: Endoscopy;  Laterality: N/A;   COLONOSCOPY WITH PROPOFOL N/A 02/12/2018   Procedure: COLONOSCOPY WITH PROPOFOL;  Surgeon: Manya Silvas, MD;  Location: [facility] ENDOSCOPY;  Service: Endoscopy;  Laterality: N/A;   [place]   ESOPHAGOGASTRODUODENOSCOPY N/A 11/03/2014   Procedure: ESOPHAGOGASTRODUODENOSCOPY (EGD);  Surgeon: Manya Silvas, MD;  Location: [facility] ENDOSCOPY;  Service: Endoscopy;  Laterality: N/A;   LAPAROSCOPIC APPENDECTOMY N/A 11/26/2014   Procedure: APPENDECTOMY LAPAROSCOPIC;  Surgeon: Robert Bellow, MD;  Location: [facility] ORS;  Service: General;  Laterality: N/A;   thyroidectomy  1979   goiter   TUBAL LIGATION  1975   Family History  Problem Relation Age of Onset   Tuberculosis Paternal Grandfather    Alzheimer's disease Mother    Goiter Mother    Cancer Brother    Cancer Brother    Cancer Brother    Cancer Brother    Social History   Socioeconomic History   Marital status: Married    Spouse name: Not on file   Number of children: Not on file   Years of education: Not on file   Highest education level: Not on file  Occupational History   Not on file  Tobacco Use   Smoking status: Former    Types:  Cigarettes    Quit date: 04/16/2009    Years since quitting: 11.8   Smokeless tobacco: Never  Substance and Sexual Activity   Alcohol use: Yes    Alcohol/week: 3.0 - 4.0 standard drinks    Types: 3 - 4 Standard drinks or equivalent per week    Comment: occ   Drug use: Yes    Types: Marijuana    Comment: marijuana every day   Sexual activity: Not on file  Other Topics Concern   Not on file  Social History Narrative   Not on file   Social Determinants of Health   Financial Resource Strain: Low Risk    Difficulty of Paying Living Expenses: Not hard at all  Food Insecurity: No Food Insecurity   Worried About Charity fundraiser in  the Last Year: Never true   Ran Out of Food in the Last Year: Never true  Transportation Needs: No Transportation Needs   Lack of Transportation (Medical): No   Lack of Transportation (Non-Medical): No  Physical Activity: Not on file  Stress: No Stress Concern Present   Feeling of Stress : Not at all  Social Connections: Unknown   Frequency of Communication with Friends and Family: More than three times a week   Frequency of Social Gatherings with Friends and Family: More than three times a week   Attends Religious Services: Not on file   Active Member of Clubs or Organizations: Not on file   Attends Archivist Meetings: Not on file   Marital Status: Not on file     Review of Systems  Constitutional:  Negative for fever and unexpected weight change.       Some decreased po intake as outlined.    HENT:  Negative for congestion.   Respiratory:  Negative for cough, chest tightness and shortness of breath.   Cardiovascular:  Negative for chest pain, palpitations and leg swelling.  Gastrointestinal:  Negative for abdominal pain, diarrhea, nausea and vomiting.  Genitourinary:  Negative for difficulty urinating and dysuria.  Musculoskeletal:  Negative for joint swelling and myalgias.  Skin:  Negative for color change and rash.  Neurological:  Positive for headaches. Negative for dizziness and light-headedness.  Psychiatric/Behavioral:  Negative for dysphoric mood.        Increased stress as outlined.        Objective:     BP (!) 160/90   Pulse 76   Temp (!) 97.3 F (36.3 C)   Resp 16   Ht 5\' 3"  (1.6 m)   Wt 125 lb 12.8 oz (57.1 kg)   SpO2 99%   BMI 22.28 kg/m  Wt Readings from Last 3 Encounters:  02/22/21 125 lb 12.8 oz (57.1 kg)  11/16/20 125 lb 6.4 oz (56.9 kg)  09/14/20 121 lb 12.8 oz (55.2 kg)    Physical Exam Vitals reviewed.  Constitutional:      General: She is not in acute distress.    Appearance: Normal appearance.  HENT:     Head: Normocephalic  and atraumatic.     Right Ear: External ear normal.     Left Ear: External ear normal.  Eyes:     General: No scleral icterus.       Right eye: No discharge.        Left eye: No discharge.     Conjunctiva/sclera: Conjunctivae normal.  Neck:     Thyroid: No thyromegaly.  Cardiovascular:     Rate and Rhythm: Normal rate and regular  rhythm.  Pulmonary:     Effort: No respiratory distress.     Breath sounds: Normal breath sounds. No wheezing.  Abdominal:     General: Bowel sounds are normal.     Palpations: Abdomen is soft.     Tenderness: There is no abdominal tenderness.  Musculoskeletal:        General: No swelling or tenderness.     Cervical back: Neck supple. No tenderness.  Lymphadenopathy:     Cervical: No cervical adenopathy.  Skin:    Findings: No erythema or rash.  Neurological:     Mental Status: She is alert.  Psychiatric:        Mood and Affect: Mood normal.        Behavior: Behavior normal.     Outpatient Encounter Medications as of 02/22/2021  Medication Sig   amLODipine (NORVASC) 5 MG tablet TAKE 1 TABLET BY MOUTH EVERY DAY   cyanocobalamin 1000 MCG tablet Take 1,000 mcg by mouth daily.   levothyroxine (SYNTHROID) 50 MCG tablet TAKE 1 TABLET BY MOUTH EVERY DAY   lisinopril (ZESTRIL) 40 MG tablet TAKE 1 TABLET BY MOUTH EVERY DAY   rosuvastatin (CRESTOR) 10 MG tablet TAKE 1 TABLET BY MOUTH EVERY DAY   No facility-administered encounter medications on file as of 02/22/2021.     Lab Results  Component Value Date   WBC 6.5 02/22/2021   HGB 10.2 (L) 02/22/2021   HCT 30.8 (L) 02/22/2021   PLT 371.0 02/22/2021   GLUCOSE 93 02/22/2021   CHOL 111 02/17/2021   TRIG 43.0 02/17/2021   HDL 54.40 02/17/2021   LDLCALC 48 02/17/2021   ALT 5 02/17/2021   AST 11 02/17/2021   NA 139 02/22/2021   K 4.2 02/22/2021   CL 102 02/22/2021   CREATININE 0.97 02/22/2021   BUN 12 02/22/2021   CO2 31 02/22/2021   TSH 1.48 02/17/2021    MM 3D SCREEN BREAST  BILATERAL  Result Date: 02/05/2020 CLINICAL DATA:  Screening. EXAM: DIGITAL SCREENING BILATERAL MAMMOGRAM WITH TOMO AND CAD COMPARISON:  Previous exam(s). ACR Breast Density Category c: The breast tissue is heterogeneously dense, which may obscure small masses. FINDINGS: There are no findings suspicious for malignancy. Images were processed with CAD. IMPRESSION: No mammographic evidence of malignancy. A result letter of this screening mammogram will be mailed directly to the patient. RECOMMENDATION: Screening mammogram in one year. (Code:SM-B-01Y) BI-RADS CATEGORY  1: Negative. Electronically Signed   By: Lillia Mountain M.D.   On: 02/05/2020 12:42       Assessment & Plan:   Problem List Items Addressed This Visit     Anemia    Hgb 9 on recent check - down from 12.  Unclear etiology.  She denies any bleeding.  Recheck cbc, iron studies and B12.  Just had colonoscopy as outlined.  No upper symptoms.  Some decrease appetite.  Weight is stable.  Further w/up pending above.  Denies increased alcohol intake.        Relevant Orders   CBC with Differential/Platelet (Completed)   Vitamin B12 (Completed)   IBC + Ferritin (Completed)   Ambulatory referral to Gastroenterology   Esophagitis    Reports no upper GI symptoms.  On prilosec.        Essential hypertension, benign    Blood pressure initially more elevated.  Recheck improved.  Increased stress as outlined.  Hold on making changes in medication.  Continue amlodipine and lisinopril.  Has been on lisinopril with no lip swelling, etc.  Follow pressures.  Follow metabolic panel.        Headache    Persistent headache as outlined.  Increased stress.  Headache is better, but still persistent bilateral headache.  Check labs, including sed rate.  Discussed possible etiologies, including temporal arteritis.  Discussed head scan given persistence.  No current vision change.        Relevant Orders   CBC with Differential/Platelet (Completed)    Sedimentation rate (Completed)   Basic metabolic panel (Completed)   History of colonic polyps    Colonoscopy 09/2020 - recommended no f/u colonoscopy.        Hypercholesterolemia    Continue crestor.  Follow lipid panel and liver function tests.   Lab Results  Component Value Date   CHOL 111 02/17/2021   HDL 54.40 02/17/2021   LDLCALC 48 02/17/2021   TRIG 43.0 02/17/2021   CHOLHDL 2 02/17/2021       Hypothyroidism    On thyroid replacement.  Follow tsh.       Stress    Increased stress as outlined.  Discussed.  Is better now.  Does not feel needs any further intervention at this time.  Follow.        Other Visit Diagnoses     Visit for screening mammogram    -  Primary   Relevant Orders   MM 3D SCREEN BREAST BILATERAL       I spent 45 minutes with the patient and more than 50% of the time was spent in consultation regarding the above.   Time spent discussing her current concerns and symptoms.  Time also spent discussing further w/up and treatment.    Einar Pheasant, MD

## 2021-02-22 NOTE — Addendum Note (Signed)
Addended by: Alisa Graff on: 02/22/2021 08:26 PM   Modules accepted: Orders

## 2021-02-22 NOTE — Assessment & Plan Note (Signed)
Reports no upper GI symptoms.  On prilosec.

## 2021-02-22 NOTE — Assessment & Plan Note (Signed)
Blood pressure initially more elevated.  Recheck improved.  Increased stress as outlined.  Hold on making changes in medication.  Continue amlodipine and lisinopril.  Has been on lisinopril with no lip swelling, etc.  Follow pressures.  Follow metabolic panel.

## 2021-02-22 NOTE — Assessment & Plan Note (Signed)
Persistent headache as outlined.  Increased stress.  Headache is better, but still persistent bilateral headache.  Check labs, including sed rate.  Discussed possible etiologies, including temporal arteritis.  Discussed head scan given persistence.  No current vision change.

## 2021-02-23 ENCOUNTER — Telehealth: Payer: Self-pay | Admitting: Internal Medicine

## 2021-02-23 DIAGNOSIS — R700 Elevated erythrocyte sedimentation rate: Secondary | ICD-10-CM

## 2021-02-23 DIAGNOSIS — R519 Headache, unspecified: Secondary | ICD-10-CM

## 2021-02-23 MED ORDER — PREDNISONE 20 MG PO TABS: ORAL_TABLET | ORAL | 1 refills | Status: DC

## 2021-02-23 NOTE — Telephone Encounter (Signed)
LMTCB

## 2021-02-23 NOTE — Telephone Encounter (Signed)
Patient is aware of below. Patient will start prednisone and also agreeable to head scan. Advised that some one will be calling her from neurology and to set up the scan

## 2021-02-23 NOTE — Telephone Encounter (Signed)
I spoke to Jessica Wall last night regarding her labs.  Discussed elevated sed rate and starting prednisone.  I have sent in the prescription for prednisone.  She is to take 3 per day and stay on this until we advise her to taper.  I told her I was going to talk to neurology.  Dr Trena Platt office will be contacting her regarding an appt.  I would also like to order MRI/MRA.  I had told her that I may need to do a head scan.  If agreeable, let me know I can order scan.

## 2021-02-24 ENCOUNTER — Other Ambulatory Visit: Payer: Self-pay | Admitting: Internal Medicine

## 2021-02-24 NOTE — Addendum Note (Signed)
Addended by: Alisa Graff on: 02/24/2021 01:28 PM   Modules accepted: Orders

## 2021-02-24 NOTE — Telephone Encounter (Signed)
Orders placed for MRI/MRA brain.

## 2021-02-26 ENCOUNTER — Telehealth: Payer: Self-pay | Admitting: Internal Medicine

## 2021-02-26 DIAGNOSIS — R519 Headache, unspecified: Secondary | ICD-10-CM

## 2021-02-26 NOTE — Telephone Encounter (Signed)
I have ordered the MRA without contrast.

## 2021-02-28 ENCOUNTER — Other Ambulatory Visit: Payer: Self-pay | Admitting: Internal Medicine

## 2021-03-06 ENCOUNTER — Telehealth: Payer: Self-pay | Admitting: Internal Medicine

## 2021-03-06 DIAGNOSIS — R519 Headache, unspecified: Secondary | ICD-10-CM

## 2021-03-06 DIAGNOSIS — R700 Elevated erythrocyte sedimentation rate: Secondary | ICD-10-CM

## 2021-03-06 NOTE — Telephone Encounter (Signed)
I have placed an urgent referral for rheumatology referral.  I spoke to [facility] at Rheumatology and she said to fax referral to [phone].  She will have Dr Posey Pronto review.    Thank you for your help.  Dr Nicki Reaper

## 2021-03-07 ENCOUNTER — Telehealth: Payer: Self-pay | Admitting: Internal Medicine

## 2021-03-07 ENCOUNTER — Ambulatory Visit
Admission: RE | Admit: 2021-03-07 | Discharge: 2021-03-07 | Disposition: A | Discharge status: Home / Self Care | Payer: Medicare Other | Source: Ambulatory Visit | Attending: Internal Medicine | Admitting: Internal Medicine

## 2021-03-07 DIAGNOSIS — R519 Headache, unspecified: Secondary | ICD-10-CM

## 2021-03-07 DIAGNOSIS — R700 Elevated erythrocyte sedimentation rate: Secondary | ICD-10-CM | POA: Insufficient documentation

## 2021-03-07 DIAGNOSIS — I6782 Cerebral ischemia: Secondary | ICD-10-CM | POA: Insufficient documentation

## 2021-03-07 MED ORDER — GADOBUTROL 1 MMOL/ML IV SOLN
5.0000 mL | Freq: Once | INTRAVENOUS | Status: AC | PRN
  Administered 2021-03-07: 5 mL via INTRAVENOUS

## 2021-03-07 NOTE — Telephone Encounter (Signed)
Sending this back to you for documentation

## 2021-03-07 NOTE — Telephone Encounter (Signed)
Pt aware of appt with Dr Bary Castilla and Dr Posey Pronto.  I have placed order for referrals, but these appts have already been made.  Just wanted you to be aware.

## 2021-03-07 NOTE — Telephone Encounter (Signed)
Spoke to Dr Bary Castilla, he can work her in tomorrow and plan for possible temporal artery biopsy next week.  Also spoke to Dr Posey Pronto and they are going to work her in as well.  Tried to call and talk with Ms Shek.  Left message with her husband to have her call office.

## 2021-03-07 NOTE — Telephone Encounter (Signed)
Discussed with pt regarding plan for surgery evaluation for temporal artery biopsy.  She is in agreement.  Appt scheduled with Dr Bary Castilla tomorrow.  Also appt scheduled with rheumatology for f/u.  Pt aware.  Instructed to remain on prednisone.

## 2021-03-07 NOTE — Telephone Encounter (Signed)
Pt returning call to Dr. Nicki Reaper

## 2021-03-08 ENCOUNTER — Other Ambulatory Visit: Payer: Self-pay | Admitting: General Surgery

## 2021-03-13 ENCOUNTER — Other Ambulatory Visit: Payer: Self-pay

## 2021-03-13 ENCOUNTER — Ambulatory Visit: Payer: Medicare Other | Admitting: Anesthesiology

## 2021-03-13 ENCOUNTER — Encounter: Payer: Self-pay | Admitting: General Surgery

## 2021-03-13 ENCOUNTER — Encounter
Admission: RE | Disposition: A | Discharge status: Home / Self Care | Payer: Self-pay | Source: Home / Self Care | Attending: General Surgery

## 2021-03-13 ENCOUNTER — Ambulatory Visit
Admission: RE | Admit: 2021-03-13 | Discharge: 2021-03-13 | Disposition: A | Discharge status: Home / Self Care | Payer: Medicare Other | Attending: General Surgery | Admitting: General Surgery

## 2021-03-13 DIAGNOSIS — M316 Other giant cell arteritis: Secondary | ICD-10-CM | POA: Diagnosis present

## 2021-03-13 DIAGNOSIS — E890 Postprocedural hypothyroidism: Secondary | ICD-10-CM | POA: Insufficient documentation

## 2021-03-13 DIAGNOSIS — I10 Essential (primary) hypertension: Secondary | ICD-10-CM | POA: Diagnosis not present

## 2021-03-13 DIAGNOSIS — Z79899 Other long term (current) drug therapy: Secondary | ICD-10-CM | POA: Insufficient documentation

## 2021-03-13 DIAGNOSIS — D649 Anemia, unspecified: Secondary | ICD-10-CM | POA: Insufficient documentation

## 2021-03-13 DIAGNOSIS — E7800 Pure hypercholesterolemia, unspecified: Secondary | ICD-10-CM | POA: Diagnosis not present

## 2021-03-13 DIAGNOSIS — Z87891 Personal history of nicotine dependence: Secondary | ICD-10-CM | POA: Insufficient documentation

## 2021-03-13 DIAGNOSIS — K219 Gastro-esophageal reflux disease without esophagitis: Secondary | ICD-10-CM | POA: Diagnosis not present

## 2021-03-13 HISTORY — PX: ARTERY BIOPSY: SHX891

## 2021-03-13 SURGERY — BIOPSY TEMPORAL ARTERY
Anesthesia: Monitor Anesthesia Care | Site: Head | Laterality: Right

## 2021-03-13 MED ORDER — LACTATED RINGERS IV SOLN: INTRAVENOUS | Status: DC

## 2021-03-13 MED ORDER — CHLORHEXIDINE GLUCONATE CLOTH 2 % EX PADS: 6.0000 | MEDICATED_PAD | Freq: Once | CUTANEOUS | Status: DC

## 2021-03-13 MED ORDER — KETAMINE HCL 10 MG/ML IJ SOLN
INTRAMUSCULAR | Status: DC | PRN
  Administered 2021-03-13: 10 mg via INTRAVENOUS

## 2021-03-13 MED ORDER — ORAL CARE MOUTH RINSE: 15.0000 mL | Freq: Once | OROMUCOSAL | Status: AC

## 2021-03-13 MED ORDER — CHLORHEXIDINE GLUCONATE 0.12 % MT SOLN: 15.0000 mL | Freq: Once | OROMUCOSAL | Status: AC

## 2021-03-13 MED ORDER — DEXAMETHASONE SODIUM PHOSPHATE 10 MG/ML IJ SOLN
INTRAMUSCULAR | Status: DC | PRN
  Administered 2021-03-13: 10 mg via INTRAVENOUS

## 2021-03-13 MED ORDER — ONDANSETRON HCL 4 MG/2ML IJ SOLN
INTRAMUSCULAR | Status: DC | PRN
  Administered 2021-03-13: 4 mg via INTRAVENOUS

## 2021-03-13 MED ORDER — PROPOFOL 500 MG/50ML IV EMUL
INTRAVENOUS | Status: DC | PRN
  Administered 2021-03-13: 25 ug/kg/min via INTRAVENOUS

## 2021-03-13 MED ORDER — FENTANYL CITRATE (PF) 100 MCG/2ML IJ SOLN
INTRAMUSCULAR | Status: DC | PRN
  Administered 2021-03-13: 25 ug via INTRAVENOUS

## 2021-03-13 MED ORDER — EPHEDRINE SULFATE 50 MG/ML IJ SOLN
INTRAMUSCULAR | Status: DC | PRN
  Administered 2021-03-13: 10 mg via INTRAVENOUS

## 2021-03-13 MED ORDER — LIDOCAINE-EPINEPHRINE 1 %-1:100000 IJ SOLN
INTRAMUSCULAR | Status: AC
  Filled 2021-03-13: qty 1

## 2021-03-13 MED ORDER — 0.9 % SODIUM CHLORIDE (POUR BTL) OPTIME
TOPICAL | Status: DC | PRN
  Administered 2021-03-13: 13:00:00 500 mL

## 2021-03-13 MED ORDER — TRAMADOL HCL 50 MG PO TABS: 50.0000 mg | ORAL_TABLET | ORAL | 0 refills | Status: DC | PRN

## 2021-03-13 MED ORDER — LIDOCAINE HCL (PF) 1 % IJ SOLN
INTRAMUSCULAR | Status: AC
  Filled 2021-03-13: qty 30

## 2021-03-13 MED ORDER — LIDOCAINE-EPINEPHRINE (PF) 1 %-1:200000 IJ SOLN
INTRAMUSCULAR | Status: DC | PRN
  Administered 2021-03-13: 5.5 mL

## 2021-03-13 MED ORDER — LIDOCAINE-EPINEPHRINE (PF) 1 %-1:200000 IJ SOLN
INTRAMUSCULAR | Status: AC
  Filled 2021-03-13: qty 20

## 2021-03-13 MED ORDER — PROPOFOL 500 MG/50ML IV EMUL
INTRAVENOUS | Status: DC | PRN
Start: 2021-03-13 — End: 2021-03-13
  Administered 2021-03-13 (×3): 20 mg via INTRAVENOUS

## 2021-03-13 MED ORDER — CHLORHEXIDINE GLUCONATE 0.12 % MT SOLN
OROMUCOSAL | Status: AC
  Administered 2021-03-13: 10:00:00 15 mL via OROMUCOSAL
  Filled 2021-03-13: qty 15

## 2021-03-13 MED ORDER — FENTANYL CITRATE (PF) 100 MCG/2ML IJ SOLN
INTRAMUSCULAR | Status: AC
  Filled 2021-03-13: qty 2

## 2021-03-13 MED ORDER — KETAMINE HCL 50 MG/5ML IJ SOSY
PREFILLED_SYRINGE | INTRAMUSCULAR | Status: AC
  Filled 2021-03-13: qty 5

## 2021-03-13 MED ORDER — DEXMEDETOMIDINE HCL IN NACL 400 MCG/100ML IV SOLN
INTRAVENOUS | Status: DC | PRN
  Administered 2021-03-13: 8 ug via INTRAVENOUS

## 2021-03-13 SURGICAL SUPPLY — 36 items
ADH SKN CLS APL DERMABOND .7 (GAUZE/BANDAGES/DRESSINGS) ×1
APL PRP STRL LF DISP 70% ISPRP (MISCELLANEOUS) ×1
BLADE SURG 15 STRL SS SAFETY (BLADE) ×2 IMPLANT
CHLORAPREP W/TINT 26 (MISCELLANEOUS) ×2 IMPLANT
CNTNR SPEC 2.5X3XGRAD LEK (MISCELLANEOUS) ×1
CONT SPEC 4OZ STER OR WHT (MISCELLANEOUS) ×1
CONT SPEC 4OZ STRL OR WHT (MISCELLANEOUS) ×1
CONTAINER SPEC 2.5X3XGRAD LEK (MISCELLANEOUS) ×1 IMPLANT
DERMABOND ADVANCED (GAUZE/BANDAGES/DRESSINGS) ×1
DERMABOND ADVANCED .7 DNX12 (GAUZE/BANDAGES/DRESSINGS) ×1 IMPLANT
DRAPE LAPAROTOMY 77X122 PED (DRAPES) ×2 IMPLANT
DRSG TEGADERM 2-3/8X2-3/4 SM (GAUZE/BANDAGES/DRESSINGS) ×2 IMPLANT
DRSG TELFA 4X3 1S NADH ST (GAUZE/BANDAGES/DRESSINGS) ×2 IMPLANT
ELECT REM PT RETURN 9FT ADLT (ELECTROSURGICAL) ×2
ELECTRODE REM PT RTRN 9FT ADLT (ELECTROSURGICAL) ×1 IMPLANT
GAUZE 4X4 16PLY ~~LOC~~+RFID DBL (SPONGE) ×2 IMPLANT
GLOVE SURG ENC MOIS LTX SZ7.5 (GLOVE) ×2 IMPLANT
GLOVE SURG UNDER LTX SZ8 (GLOVE) ×2 IMPLANT
GOWN STRL REUS W/ TWL LRG LVL3 (GOWN DISPOSABLE) ×1 IMPLANT
GOWN STRL REUS W/TWL LRG LVL3 (GOWN DISPOSABLE) ×2
LABEL OR SOLS (LABEL) ×2 IMPLANT
MANIFOLD NEPTUNE II (INSTRUMENTS) ×2 IMPLANT
NEEDLE HYPO 25X1 1.5 SAFETY (NEEDLE) ×2 IMPLANT
NS IRRIG 500ML POUR BTL (IV SOLUTION) ×2 IMPLANT
PACK BASIN MINOR ARMC (MISCELLANEOUS) ×2 IMPLANT
SPONGE GAUZE 2X2 8PLY STRL LF (GAUZE/BANDAGES/DRESSINGS) ×2 IMPLANT
STRAP SAFETY 5IN WIDE (MISCELLANEOUS) ×2 IMPLANT
STRIP CLOSURE SKIN 1/2X4 (GAUZE/BANDAGES/DRESSINGS) ×2 IMPLANT
SUT ETHILON 4 0 P 3 18 (SUTURE) ×2 IMPLANT
SUT SILK 3 0 (SUTURE)
SUT SILK 3-0 18XBRD TIE 12 (SUTURE) IMPLANT
SUT VIC AB 3-0 SH 27 (SUTURE) ×2
SUT VIC AB 3-0 SH 27X BRD (SUTURE) ×1 IMPLANT
SWABSTK COMLB BENZOIN TINCTURE (MISCELLANEOUS) ×2 IMPLANT
SYR CONTROL 10ML LL (SYRINGE) ×2 IMPLANT
WATER STERILE IRR 500ML POUR (IV SOLUTION) ×2 IMPLANT

## 2021-03-13 NOTE — Anesthesia Postprocedure Evaluation (Signed)
Anesthesia Post Note  Patient: Jessica Wall  Procedure(s) Performed: BIOPSY TEMPORAL ARTERY (Right: Head)  Patient location during evaluation: PACU Anesthesia Type: MAC Level of consciousness: awake and awake and alert Pain management: pain level controlled Respiratory status: spontaneous breathing Cardiovascular status: blood pressure returned to baseline Anesthetic complications: no   No notable events documented.   Last Vitals:  Vitals:   03/13/21 0949 03/13/21 1253  BP: 125/75 (!) 107/57  Pulse: 68 (!) 51  Resp: 16 12  Temp: 36.6 C 36.4 C  SpO2: 99% 100%    Last Pain:  Vitals:   03/13/21 1253  TempSrc:   PainSc: 0-No pain                 VAN STAVEREN,Tyray Proch

## 2021-03-13 NOTE — Op Note (Signed)
Preoperative diagnosis: Possible temporal arteritis.  Postoperative diagnosis: Same.  Operative procedure: Biopsy of the right temporal artery.  Operating Surgeon: Hervey Ard, MD.  Anesthesia: Monitored anesthesia, 5 cc 1% Xylocaine with 1: 200,000 epinephrine.  Estimated blood loss: Less than 1 cc.  Clinical: 75 year old woman recently has profound headache that lasted for months.  No source identified.  Responded promptly to high-dose steroids.  Biopsy been requested to assess for temporal arteritis.  Operative note: The patient was comfortably supine on the table with the head turned to the left.  Hair was removed with clippers.  The area was cleansed with Betadine solution taking care to protect the external auditory canal.  Local anesthesia was infiltrated away from the planned incision.  A vertical incision in front of the ear was made after verifying a faint Doppler signal (weak, intermittent pulse by palpation).  The skin was incised sharply and dissection was completed electrocautery.  The temporal artery was anemic in diameter and pulsation.  A 1 and half centimeter segment was excised and hemostasis easily obtained with a 3-0 Vicryl tie.  Palpation to the wound showed no additional structures there is just more dominant artery.  The wound was closed in layers with 3-0 Vicryl running suture to the deep tissue and then a running 3-0 Vicryl suture as subcuticular closure.  Dermabond was applied.  Patient tolerated procedure well was taken recovery in stable condition.

## 2021-03-13 NOTE — H&P (Signed)
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  For biopsy.   Medications Prior to Admission  Medication Sig Dispense Refill Last Dose   amLODipine (NORVASC) 5 MG tablet TAKE 1 TABLET BY MOUTH EVERY DAY 90 tablet 1 03/13/2021   cyanocobalamin 1000 MCG tablet Take 1,000 mcg by mouth daily.   03/13/2021   levothyroxine (SYNTHROID) 50 MCG tablet TAKE 1 TABLET BY MOUTH EVERY DAY 90 tablet 1 03/13/2021   lisinopril (ZESTRIL) 40 MG tablet TAKE 1 TABLET BY MOUTH EVERY DAY 90 tablet 1 03/13/2021   predniSONE (DELTASONE) 20 MG tablet Take 3 tablets q day 90 tablet 1 03/13/2021   rosuvastatin (CRESTOR) 10 MG tablet TAKE 1 TABLET BY MOUTH EVERY DAY 90 tablet 3 03/12/2021   Allergies  Allergen Reactions   Aspirin Swelling   Past Medical History:  Diagnosis Date   GERD (gastroesophageal reflux disease)    Goiter    s/p thyroidectomy   H/O syncope    Hypercholesterolemia    Hypothyroidism    Recurrent sinus infections    and allergy problems   Past Surgical History:  Procedure Laterality Date   BREAST BIOPSY     COLONOSCOPY WITH PROPOFOL N/A 11/03/2014   Procedure: COLONOSCOPY WITH PROPOFOL;  Surgeon: Manya Silvas, MD;  Location: [facility] ENDOSCOPY;  Service: Endoscopy;  Laterality: N/A;   COLONOSCOPY WITH PROPOFOL N/A 02/12/2018   Procedure: COLONOSCOPY WITH PROPOFOL;  Surgeon: Manya Silvas, MD;  Location: [facility] ENDOSCOPY;  Service: Endoscopy;  Laterality: N/A;   [place]   ESOPHAGOGASTRODUODENOSCOPY N/A 11/03/2014   Procedure: ESOPHAGOGASTRODUODENOSCOPY (EGD);  Surgeon: Manya Silvas, MD;  Location: [facility] ENDOSCOPY;  Service: Endoscopy;  Laterality: N/A;   LAPAROSCOPIC APPENDECTOMY N/A 11/26/2014   Procedure: APPENDECTOMY LAPAROSCOPIC;  Surgeon: Robert Bellow, MD;  Location: [facility] ORS;  Service: General;  Laterality: N/A;   thyroidectomy  1979   goiter   TUBAL LIGATION  1975   Social History    Socioeconomic History   Marital status: Married    Spouse name: Not on file   Number of children: Not on file   Years of education: Not on file   Highest education level: Not on file  Occupational History   Not on file  Tobacco Use   Smoking status: Former    Types: Cigarettes    Quit date: 04/16/2009    Years since quitting: 11.9   Smokeless tobacco: Never  Substance and Sexual Activity   Alcohol use: Yes    Alcohol/week: 3.0 - 4.0 standard drinks    Types: 3 - 4 Standard drinks or equivalent per week    Comment: occ   Drug use: Yes    Types: Marijuana    Comment: marijuana every day   Sexual activity: Not on file  Other Topics Concern   Not on file  Social History Narrative   Not on file   Social Determinants of Health   Financial Resource Strain: Low Risk    Difficulty of Paying Living Expenses: Not hard at all  Food Insecurity: No Food Insecurity   Worried About Charity fundraiser in the Last Year: Never true   Ran Out of Food in the Last Year: Never true  Transportation Needs: No Transportation Needs   Lack of Transportation (Medical): No   Lack of Transportation (Non-Medical): No  Physical Activity: Not on file  Stress: No Stress Concern Present   Feeling of  Stress : Not at all  Social Connections: Unknown   Frequency of Communication with Friends and Family: More than three times a week   Frequency of Social Gatherings with Friends and Family: More than three times a week   Attends Religious Services: Not on Electrical engineer or Organizations: Not on file   Attends Archivist Meetings: Not on file   Marital Status: Not on file  Intimate Partner Violence: Not At Risk   Fear of Current or Ex-Partner: No   Emotionally Abused: No   Physically Abused: No   Sexually Abused: No   Social History   Social History Narrative   Not on file     ROS: Negative.     PE: HEENT: Negative. Lungs: Clear. Cardio:  RR.  Assessment/Plan:  Proceed with planned right temporal artery biopsy.   [facility] Gleason [facility] 03/13/2021

## 2021-03-13 NOTE — Anesthesia Preprocedure Evaluation (Signed)
Anesthesia Evaluation  Patient identified by MRN, date of birth, ID band Patient awake    Reviewed: Allergy & Precautions, NPO status , Patient's Chart, lab work & pertinent test results  History of Anesthesia Complications Negative for: history of anesthetic complications  Airway Mallampati: II  TM Distance: >3 FB Neck ROM: full    Dental  (+) Teeth Intact   Pulmonary neg pulmonary ROS, Patient abstained from smoking., former smoker,    Pulmonary exam normal breath sounds clear to auscultation       Cardiovascular Exercise Tolerance: Good hypertension, Pt. on medications negative cardio ROS Normal cardiovascular exam Rhythm:Regular     Neuro/Psych  Headaches, negative neurological ROS  negative psych ROS   GI/Hepatic negative GI ROS, Neg liver ROS, GERD  ,  Endo/Other  negative endocrine ROSHypothyroidism   Renal/GU negative Renal ROS  negative genitourinary   Musculoskeletal negative musculoskeletal ROS (+)   Abdominal Normal abdominal exam  (+)   Peds negative pediatric ROS (+)  Hematology negative hematology ROS (+) Blood dyscrasia, anemia ,   Anesthesia Other Findings Past Medical History: No date: GERD (gastroesophageal reflux disease) No date: Goiter     Comment:  s/p thyroidectomy No date: H/O syncope No date: Hypercholesterolemia No date: Hypothyroidism No date: Recurrent sinus infections     Comment:  and allergy problems  Past Surgical History: No date: BREAST BIOPSY 11/03/2014: COLONOSCOPY WITH PROPOFOL; N/A     Comment:  Procedure: COLONOSCOPY WITH PROPOFOL;  Surgeon: Manya Silvas, MD;  Location: [facility] ENDOSCOPY;  Service:               Endoscopy;  Laterality: N/A; 02/12/2018: COLONOSCOPY WITH PROPOFOL; N/A     Comment:  Procedure: COLONOSCOPY WITH PROPOFOL;  Surgeon: Manya Silvas, MD;  Location: [facility] ENDOSCOPY;  Service:               Endoscopy;   Laterality: N/A; 1980 & 1993: DILATION AND CURETTAGE OF UTERUS 11/03/2014: ESOPHAGOGASTRODUODENOSCOPY; N/A     Comment:  Procedure: ESOPHAGOGASTRODUODENOSCOPY (EGD);  Surgeon:               Manya Silvas, MD;  Location: [facility] ENDOSCOPY;                Service: Endoscopy;  Laterality: N/A; 11/26/2014: LAPAROSCOPIC APPENDECTOMY; N/A     Comment:  Procedure: APPENDECTOMY LAPAROSCOPIC;  Surgeon: Robert Bellow, MD;  Location: [facility] ORS;  Service: General;                Laterality: N/A; 1979: thyroidectomy     Comment:  goiter 1975: TUBAL LIGATION  BMI    Body Mass Index: 22.14 kg/m      Reproductive/Obstetrics negative OB ROS                             Anesthesia Physical Anesthesia Plan  ASA: 2  Anesthesia Plan: MAC   Post-op Pain Management:    Induction: Intravenous  PONV Risk Score and Plan:   Airway Management Planned: Nasal Cannula  Additional Equipment:   Intra-op Plan:   Post-operative Plan:   Informed Consent: I have reviewed the patients History and Physical, chart, labs and discussed the procedure including the risks,  benefits and alternatives for the proposed anesthesia with the patient or authorized representative who has indicated his/her understanding and acceptance.       Plan Discussed with: CRNA and Surgeon  Anesthesia Plan Comments:         Anesthesia Quick Evaluation

## 2021-03-13 NOTE — Transfer of Care (Addendum)
Immediate Anesthesia Transfer of Care Note  Patient: Jessica Wall  Procedure(s) Performed: BIOPSY TEMPORAL ARTERY (Right: Head)  Patient Location: PACU  Anesthesia Type:MAC  Level of Consciousness: awake  Airway & Oxygen Therapy: Patient Spontanous Breathing  Post-op Assessment: Report given to RN  Post vital signs: stable  Last Vitals:  Vitals Value Taken Time  BP    Temp    Pulse    Resp    SpO2      Last Pain:  Vitals:   03/13/21 0949  TempSrc: Temporal  PainSc: 0-No pain         Complications: No notable events documented.

## 2021-03-13 NOTE — Discharge Instructions (Signed)
AMBULATORY SURGERY  ?DISCHARGE INSTRUCTIONS ? ? ?The drugs that you were given will stay in your system until tomorrow so for the next 24 hours you should not: ? ?Drive an automobile ?Make any legal decisions ?Drink any alcoholic beverage ? ? ?You may resume regular meals tomorrow.  Today it is better to start with liquids and gradually work up to solid foods. ? ?You may eat anything you prefer, but it is better to start with liquids, then soup and crackers, and gradually work up to solid foods. ? ? ?Please notify your doctor immediately if you have any unusual bleeding, trouble breathing, redness and pain at the surgery site, drainage, fever, or pain not relieved by medication. ? ? ? ?Additional Instructions: ? ? ? ?Please contact your physician with any problems or Same Day Surgery at [phone], Monday through Friday 6 am to 4 pm, or [facility] [place] Main number at [phone].  ?

## 2021-03-14 ENCOUNTER — Encounter: Payer: Self-pay | Admitting: General Surgery

## 2021-03-14 LAB — SURGICAL PATHOLOGY

## 2021-03-15 ENCOUNTER — Telehealth: Payer: Self-pay

## 2021-03-15 NOTE — Telephone Encounter (Signed)
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  Dr Bary Castilla discussed with her as well.  I have already talked to and referred her to rheumatology (Dr Posey Pronto). Please confirm that she had appt scheduled.  If not, let me know.  Needs to keep appt.  Needs to stay on current dose of prednisone and Dr Posey Pronto will continue to follow for treatment of temporal arteritis.

## 2021-03-15 NOTE — Telephone Encounter (Signed)
LMTCB

## 2021-03-17 DIAGNOSIS — Z7960 Long term (current) use of unspecified immunomodulators and immunosuppressants: Secondary | ICD-10-CM | POA: Insufficient documentation

## 2021-03-17 DIAGNOSIS — Z7952 Long term (current) use of systemic steroids: Secondary | ICD-10-CM | POA: Insufficient documentation

## 2021-03-21 ENCOUNTER — Ambulatory Visit: Payer: Medicare Other

## 2021-03-23 ENCOUNTER — Telehealth: Payer: Self-pay | Admitting: *Deleted

## 2021-03-23 NOTE — Telephone Encounter (Signed)
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  Dr Bary Castilla discussed with her as well.  I have already talked to and referred her to rheumatology (Dr Posey Pronto). Please confirm that she had appt scheduled.  If not, let me know.  Needs to keep appt.  Needs to stay on current dose of prednisone and Dr Posey Pronto will continue to follow for treatment of temporal arteritis.

## 2021-03-23 NOTE — Telephone Encounter (Signed)
Multiple attempts made to reach patient please if patient calls just need to know patient saw Dr. Posey Pronto and was given pathology report by Dr. Posey Pronto.If not transfer call to nurse.

## 2021-03-24 NOTE — Telephone Encounter (Signed)
Pt returned call and states everything is complete regarding her pathology report.

## 2021-03-24 NOTE — Telephone Encounter (Signed)
Patient kept appointment with Dr. Posey Pronto and Dr. Posey Pronto went over pathology report.

## 2021-03-27 ENCOUNTER — Other Ambulatory Visit: Payer: Self-pay

## 2021-03-27 ENCOUNTER — Encounter: Payer: Self-pay | Admitting: Internal Medicine

## 2021-03-27 ENCOUNTER — Ambulatory Visit (INDEPENDENT_AMBULATORY_CARE_PROVIDER_SITE_OTHER): Payer: Medicare Other | Admitting: Internal Medicine

## 2021-03-27 VITALS — BP 126/78 | HR 77 | Temp 97.6°F | Resp 16 | Ht 63.0 in | Wt 123.2 lb

## 2021-03-27 DIAGNOSIS — E7800 Pure hypercholesterolemia, unspecified: Secondary | ICD-10-CM

## 2021-03-27 DIAGNOSIS — I10 Essential (primary) hypertension: Secondary | ICD-10-CM

## 2021-03-27 DIAGNOSIS — D649 Anemia, unspecified: Secondary | ICD-10-CM | POA: Diagnosis not present

## 2021-03-27 DIAGNOSIS — Z86010 Personal history of colonic polyps: Secondary | ICD-10-CM

## 2021-03-27 DIAGNOSIS — M316 Other giant cell arteritis: Secondary | ICD-10-CM | POA: Insufficient documentation

## 2021-03-27 DIAGNOSIS — E039 Hypothyroidism, unspecified: Secondary | ICD-10-CM | POA: Diagnosis not present

## 2021-03-27 DIAGNOSIS — F439 Reaction to severe stress, unspecified: Secondary | ICD-10-CM

## 2021-03-27 LAB — CBC WITH DIFFERENTIAL/PLATELET
Basophils Absolute: 0 10*3/uL (ref 0.0–0.1)
Basophils Relative: 0.1 % (ref 0.0–3.0)
Eosinophils Absolute: 0 10*3/uL (ref 0.0–0.7)
Eosinophils Relative: 0 % (ref 0.0–5.0)
HCT: 35.6 % — ABNORMAL LOW (ref 36.0–46.0)
Hemoglobin: 11.8 g/dL — ABNORMAL LOW (ref 12.0–15.0)
Lymphocytes Relative: 7.3 % — ABNORMAL LOW (ref 12.0–46.0)
Lymphs Abs: 0.9 10*3/uL (ref 0.7–4.0)
MCHC: 33.1 g/dL (ref 30.0–36.0)
MCV: 96.3 fl (ref 78.0–100.0)
Monocytes Absolute: 0.3 10*3/uL (ref 0.1–1.0)
Monocytes Relative: 2.5 % — ABNORMAL LOW (ref 3.0–12.0)
Neutro Abs: 10.9 10*3/uL — ABNORMAL HIGH (ref 1.4–7.7)
Neutrophils Relative %: 90.1 % — ABNORMAL HIGH (ref 43.0–77.0)
Platelets: 115 10*3/uL — ABNORMAL LOW (ref 150.0–400.0)
RBC: 3.69 Mil/uL — ABNORMAL LOW (ref 3.87–5.11)
RDW: 16.8 % — ABNORMAL HIGH (ref 11.5–15.5)
WBC: 12.1 10*3/uL — ABNORMAL HIGH (ref 4.0–10.5)

## 2021-03-27 LAB — IBC + FERRITIN
Ferritin: 190.7 ng/mL (ref 10.0–291.0)
Iron: 129 ug/dL (ref 42–145)
Saturation Ratios: 43.9 % (ref 20.0–50.0)
TIBC: 294 ug/dL (ref 250.0–450.0)
Transferrin: 210 mg/dL — ABNORMAL LOW (ref 212.0–360.0)

## 2021-03-27 NOTE — Assessment & Plan Note (Signed)
Continue crestor.  Follow lipid panel and liver function tests.   Lab Results  Component Value Date   CHOL 111 02/17/2021   HDL 54.40 02/17/2021   LDLCALC 48 02/17/2021   TRIG 43.0 02/17/2021   CHOLHDL 2 02/17/2021

## 2021-03-27 NOTE — Assessment & Plan Note (Addendum)
Diagnosed recently with temporal arteritis.  Biopsy proven.  On prednisone.  Seeing rheumatology.  Now on 50mg  q day.  Continue follow up with rheumatology.  Discussed importance of not stopping the prednisone.  Started on MTX.  No headache now.

## 2021-03-27 NOTE — Assessment & Plan Note (Signed)
Colonoscopy 09/2020 - recommended no f/u colonoscopy.

## 2021-03-27 NOTE — Assessment & Plan Note (Signed)
hgb decreased on recent check.  Recheck cbc and ferritin today.

## 2021-03-27 NOTE — Assessment & Plan Note (Signed)
On thyroid replacement.  Follow tsh.  

## 2021-03-27 NOTE — Assessment & Plan Note (Signed)
Blood pressure doing well on lisinopril and amlodipine.  Continue current medication.  Follow pressures.  Follow metabolic panel.

## 2021-03-27 NOTE — Assessment & Plan Note (Signed)
Discussed.  Discussed medication.  Overall handling things well.  Follow.

## 2021-03-27 NOTE — Progress Notes (Signed)
[name], female   DOB: [birth year], 75 y.o.   MRN: [id number]   Subjective:    [name], female    DOB: [birth year], 75 y.o.   MRN: [id number]  This visit occurred during the SARS-CoV-2 public health emergency.  Safety protocols were in place, including screening questions prior to the visit, additional usage of staff PPE, and extensive cleaning of exam room while observing appropriate contact time as indicated for disinfecting solutions.   Patient here for a scheduled follow up.    Chief Complaint  Patient presents with   Hypertension   .   HPI Recently diagnosed with temporal arteritis.  On prednisone.  Seeing rheumatology.  Recently decreased prednisone to 50mg  q day.  Started on MTX.  No headache.  Increased stress related to her current medical issues and taking the medications.  States she is not used to taking so many medications.  Discussed importance of taking and not abruptly stopping prednisone.  Stays active.  No chest pain or sob reported.  Eating.  No abdominal pain.  Bowels moving.     Past Medical History:  Diagnosis Date   GERD (gastroesophageal reflux disease)    Goiter    s/p thyroidectomy   H/O syncope    Hypercholesterolemia    Hypothyroidism    Recurrent sinus infections    and allergy problems   Past Surgical History:  Procedure Laterality Date   ARTERY BIOPSY Right 03/13/2021   Procedure: BIOPSY TEMPORAL ARTERY;  Surgeon: Robert Bellow, MD;  Location: [facility];  Service: General;  Laterality: Right;   BREAST BIOPSY     COLONOSCOPY WITH PROPOFOL N/A 11/03/2014   Procedure: COLONOSCOPY WITH PROPOFOL;  Surgeon: Manya Silvas, MD;  Location: [place];  Service: Endoscopy;  Laterality: N/A;   COLONOSCOPY WITH PROPOFOL N/A 02/12/2018   Procedure: COLONOSCOPY WITH PROPOFOL;  Surgeon: Manya Silvas, MD;  Location: [facility] ENDOSCOPY;  Service: Endoscopy;  Laterality: N/A;   [place]   ESOPHAGOGASTRODUODENOSCOPY N/A 11/03/2014   Procedure: ESOPHAGOGASTRODUODENOSCOPY (EGD);  Surgeon: Manya Silvas, MD;  Location: [facility] ENDOSCOPY;  Service: Endoscopy;  Laterality: N/A;   LAPAROSCOPIC APPENDECTOMY N/A 11/26/2014   Procedure: APPENDECTOMY LAPAROSCOPIC;  Surgeon: Robert Bellow, MD;  Location: [facility] ORS;  Service: General;  Laterality: N/A;   thyroidectomy  1979   goiter   TUBAL LIGATION  1975   Family History  Problem Relation Age of Onset   Tuberculosis Paternal Grandfather    Alzheimer's disease Mother    Goiter Mother    Cancer Brother    Cancer Brother    Cancer Brother    Cancer Brother    Social History   Socioeconomic History   Marital status: Married    Spouse name: Not on file   Number of children: Not on file   Years of education: Not on file   Highest education level: Not on file  Occupational History   Not on file  Tobacco Use   Smoking status: Former    Types: Cigarettes    Quit date: 04/16/2009    Years since quitting: 11.9   Smokeless tobacco: Never  Substance and Sexual Activity   Alcohol use: Yes    Alcohol/week: 3.0 - 4.0 standard drinks    Types: 3 - 4 Standard drinks or equivalent per week    Comment: occ   Drug use: Yes    Types: Marijuana    Comment:  marijuana every day   Sexual activity: Not on file  Other Topics Concern   Not on file  Social History Narrative   Not on file   Social Determinants of Health   Financial Resource Strain: Not on file  Food Insecurity: Not on file  Transportation Needs: Not on file  Physical Activity: Not on file  Stress: Not on file  Social Connections: Not on file    Review of Systems  Constitutional:  Negative for fatigue and fever.  HENT:  Negative for congestion and sinus pressure.   Respiratory:  Negative for cough, chest tightness and shortness of breath.   Cardiovascular:  Negative for chest pain, palpitations and leg swelling.  Gastrointestinal:  Negative for abdominal  pain, diarrhea, nausea and vomiting.  Genitourinary:  Negative for difficulty urinating and dysuria.  Musculoskeletal:  Negative for joint swelling and myalgias.  Skin:  Negative for color change and rash.  Neurological:  Negative for dizziness, light-headedness and headaches.  Psychiatric/Behavioral:  Negative for agitation and dysphoric mood.       Objective:     BP 126/78   Pulse 77   Temp 97.6 F (36.4 C)   Resp 16   Ht 5\' 3"  (1.6 m)   Wt 123 lb 3.2 oz (55.9 kg)   SpO2 99%   BMI 21.82 kg/m  Wt Readings from Last 3 Encounters:  03/27/21 123 lb 3.2 oz (55.9 kg)  03/13/21 125 lb (56.7 kg)  02/22/21 125 lb 12.8 oz (57.1 kg)    Physical Exam Vitals reviewed.  Constitutional:      General: She is not in acute distress.    Appearance: Normal appearance.  HENT:     Head: Normocephalic and atraumatic.     Right Ear: External ear normal.     Left Ear: External ear normal.  Eyes:     General: No scleral icterus.       Right eye: No discharge.        Left eye: No discharge.     Conjunctiva/sclera: Conjunctivae normal.  Neck:     Thyroid: No thyromegaly.  Cardiovascular:     Rate and Rhythm: Normal rate and regular rhythm.  Pulmonary:     Effort: No respiratory distress.     Breath sounds: Normal breath sounds. No wheezing.  Abdominal:     General: Bowel sounds are normal.     Palpations: Abdomen is soft.     Tenderness: There is no abdominal tenderness.  Musculoskeletal:        General: No swelling or tenderness.     Cervical back: Neck supple. No tenderness.  Lymphadenopathy:     Cervical: No cervical adenopathy.  Skin:    Findings: No erythema or rash.  Neurological:     Mental Status: She is alert.  Psychiatric:        Mood and Affect: Mood normal.        Behavior: Behavior normal.     Outpatient Encounter Medications as of 03/27/2021  Medication Sig   amLODipine (NORVASC) 5 MG tablet TAKE 1 TABLET BY MOUTH EVERY DAY   cyanocobalamin 1000 MCG tablet  Take 1,000 mcg by mouth daily.   levothyroxine (SYNTHROID) 50 MCG tablet TAKE 1 TABLET BY MOUTH EVERY DAY   lisinopril (ZESTRIL) 40 MG tablet TAKE 1 TABLET BY MOUTH EVERY DAY   predniSONE (DELTASONE) 20 MG tablet Take 3 tablets q day   rosuvastatin (CRESTOR) 10 MG tablet TAKE 1 TABLET BY MOUTH EVERY DAY   traMADol (  ULTRAM) 50 MG tablet Take 1 tablet (50 mg total) by mouth every 4 (four) hours as needed.   No facility-administered encounter medications on file as of 03/27/2021.     Lab Results  Component Value Date   WBC 12.1 (H) 03/27/2021   HGB 11.8 (L) 03/27/2021   HCT 35.6 (L) 03/27/2021   PLT 115.0 (L) 03/27/2021   GLUCOSE 93 02/22/2021   CHOL 111 02/17/2021   TRIG 43.0 02/17/2021   HDL 54.40 02/17/2021   LDLCALC 48 02/17/2021   ALT 5 02/17/2021   AST 11 02/17/2021   NA 139 02/22/2021   K 4.2 02/22/2021   CL 102 02/22/2021   CREATININE 0.97 02/22/2021   BUN 12 02/22/2021   CO2 31 02/22/2021   TSH 1.48 02/17/2021       Assessment & Plan:   Problem List Items Addressed This Visit     Anemia - Primary    hgb decreased on recent check.  Recheck cbc and ferritin today.        Relevant Orders   CBC with Differential/Platelet (Completed)   IBC + Ferritin (Completed)   Essential hypertension, benign    Blood pressure doing well on lisinopril and amlodipine.  Continue current medication.  Follow pressures.  Follow metabolic panel.       History of colonic polyps    Colonoscopy 09/2020 - recommended no f/u colonoscopy.        Hypercholesterolemia    Continue crestor.  Follow lipid panel and liver function tests.   Lab Results  Component Value Date   CHOL 111 02/17/2021   HDL 54.40 02/17/2021   LDLCALC 48 02/17/2021   TRIG 43.0 02/17/2021   CHOLHDL 2 02/17/2021       Hypothyroidism    On thyroid replacement.  Follow tsh.       Stress    Discussed.  Discussed medication.  Overall handling things well.  Follow.       Temporal arteritis (Ages)    Diagnosed  recently with temporal arteritis.  Biopsy proven.  On prednisone.  Seeing rheumatology.  Now on 50mg  q day.  Continue follow up with rheumatology.  Discussed importance of not stopping the prednisone.  Started on MTX.  No headache now.         Einar Pheasant, MD

## 2021-03-28 ENCOUNTER — Other Ambulatory Visit: Payer: Self-pay | Admitting: *Deleted

## 2021-03-28 DIAGNOSIS — D696 Thrombocytopenia, unspecified: Secondary | ICD-10-CM

## 2021-04-05 ENCOUNTER — Other Ambulatory Visit: Payer: Self-pay

## 2021-04-05 ENCOUNTER — Other Ambulatory Visit (INDEPENDENT_AMBULATORY_CARE_PROVIDER_SITE_OTHER): Payer: Medicare Other

## 2021-04-05 DIAGNOSIS — D696 Thrombocytopenia, unspecified: Secondary | ICD-10-CM

## 2021-04-05 LAB — CBC WITH DIFFERENTIAL/PLATELET
Basophils Absolute: 0 10*3/uL (ref 0.0–0.1)
Basophils Relative: 0.2 % (ref 0.0–3.0)
Eosinophils Absolute: 0 10*3/uL (ref 0.0–0.7)
Eosinophils Relative: 0 % (ref 0.0–5.0)
HCT: 36.4 % (ref 36.0–46.0)
Hemoglobin: 12.1 g/dL (ref 12.0–15.0)
Lymphocytes Relative: 12.8 % (ref 12.0–46.0)
Lymphs Abs: 1.2 10*3/uL (ref 0.7–4.0)
MCHC: 33.3 g/dL (ref 30.0–36.0)
MCV: 97.7 fl (ref 78.0–100.0)
Monocytes Absolute: 0.3 10*3/uL (ref 0.1–1.0)
Monocytes Relative: 3 % (ref 3.0–12.0)
Neutro Abs: 7.6 10*3/uL (ref 1.4–7.7)
Neutrophils Relative %: 84 % — ABNORMAL HIGH (ref 43.0–77.0)
Platelets: 170 10*3/uL (ref 150.0–400.0)
RBC: 3.73 Mil/uL — ABNORMAL LOW (ref 3.87–5.11)
RDW: 18.3 % — ABNORMAL HIGH (ref 11.5–15.5)
WBC: 9 10*3/uL (ref 4.0–10.5)

## 2021-04-14 ENCOUNTER — Other Ambulatory Visit: Payer: Self-pay | Admitting: Internal Medicine

## 2021-04-24 ENCOUNTER — Other Ambulatory Visit: Payer: Self-pay | Admitting: Internal Medicine

## 2021-04-24 DIAGNOSIS — D509 Iron deficiency anemia, unspecified: Secondary | ICD-10-CM | POA: Diagnosis not present

## 2021-04-24 DIAGNOSIS — Z86010 Personal history of colonic polyps: Secondary | ICD-10-CM | POA: Diagnosis not present

## 2021-05-23 ENCOUNTER — Ambulatory Visit (INDEPENDENT_AMBULATORY_CARE_PROVIDER_SITE_OTHER): Payer: Medicare Other

## 2021-05-23 VITALS — Ht 63.0 in | Wt 123.0 lb

## 2021-05-23 DIAGNOSIS — Z0000 Encounter for general adult medical examination without abnormal findings: Secondary | ICD-10-CM

## 2021-05-23 NOTE — Progress Notes (Signed)
Subjective:   Jessica Wall is a 76 y.o. female who presents for Medicare Annual (Subsequent) preventive examination.  Review of Systems    No ROS.  Medicare Wellness Virtual Visit.  Visual/audio telehealth visit, UTA vital signs.   See social history for additional risk factors.   Cardiac Risk Factors include: advanced age (>87men, >25 women)     Objective:    Today's Vitals   05/23/21 1448  Weight: 123 lb (55.8 kg)  Height: 5\' 3"  (1.6 m)   Body mass index is 21.79 kg/m.  Advanced Directives 05/23/2021 03/13/2021 03/18/2020 03/18/2019 02/12/2018 11/26/2014  Does Patient Have a Medical Advance Directive? Yes No No No No No  Would patient like information on creating a medical advance directive? - - No - Patient declined Yes (MAU/Ambulatory/Procedural Areas - Information given) Yes (MAU/Ambulatory/Procedural Areas - Information given) No - patient declined information    Current Medications (verified) Outpatient Encounter Medications as of 05/23/2021  Medication Sig   amLODipine (NORVASC) 5 MG tablet TAKE 1 TABLET BY MOUTH EVERY DAY   cyanocobalamin 1000 MCG tablet Take 1,000 mcg by mouth daily.   levothyroxine (SYNTHROID) 50 MCG tablet TAKE 1 TABLET BY MOUTH EVERY DAY   lisinopril (ZESTRIL) 40 MG tablet TAKE 1 TABLET BY MOUTH EVERY DAY   predniSONE (DELTASONE) 20 MG tablet TAKE 3 TABLETS BY MOUTH EVERY DAY   rosuvastatin (CRESTOR) 10 MG tablet TAKE 1 TABLET BY MOUTH EVERY DAY   traMADol (ULTRAM) 50 MG tablet Take 1 tablet (50 mg total) by mouth every 4 (four) hours as needed. (Patient not taking: Reported on 05/23/2021)   No facility-administered encounter medications on file as of 05/23/2021.    Allergies (verified) Aspirin   History: Past Medical History:  Diagnosis Date   GERD (gastroesophageal reflux disease)    Goiter    s/p thyroidectomy   H/O syncope    Hypercholesterolemia    Hypothyroidism    Recurrent sinus infections    and allergy problems   Past Surgical  History:  Procedure Laterality Date   ARTERY BIOPSY Right 03/13/2021   Procedure: BIOPSY TEMPORAL ARTERY;  Surgeon: Robert Bellow, MD;  Location: [facility]S;  Service: General;  Laterality: Right;   BREAST BIOPSY     COLONOSCOPY WITH PROPOFOL N/A 11/03/2014   Procedure: COLONOSCOPY WITH PROPOFOL;  Surgeon: Manya Silvas, MD;  Location: [place];  Service: Endoscopy;  Laterality: N/A;   COLONOSCOPY WITH PROPOFOL N/A 02/12/2018   Procedure: COLONOSCOPY WITH PROPOFOL;  Surgeon: Manya Silvas, MD;  Location[facility] ENDOSCOPY;  Service: Endoscopy;  Laterality: N/A;   [place]   ESOPHAGOGASTRODUODENOSCOPY N/A 11/03/2014   Procedure: ESOPHAGOGASTRODUODENOSCOPY (EGD);  Surgeon: Manya Silvas, MD;  Location: [facility] ENDOSCOPY;  Service: Endoscopy;  Laterality: N/A;   LAPAROSCOPIC APPENDECTOMY N/A 11/26/2014   Procedure: APPENDECTOMY LAPAROSCOPIC;  Surgeon: Robert Bellow, MD;  Location: [facility]S;  Service: General;  Laterality: N/A;   thyroidectomy  1979   goiter   TUBAL LIGATION  1975   Family History  Problem Relation Age of Onset   Tuberculosis Paternal Grandfather    Alzheimer's disease Mother    Goiter Mother    Cancer Brother    Cancer Brother    Cancer Brother    Cancer Brother    Social History   Socioeconomic History   Marital status: Married    Spouse name: Not on file   Number of children: Not on file   Years  of education: Not on file   Highest education level: Not on file  Occupational History   Not on file  Tobacco Use   Smoking status: Former    Types: Cigarettes    Quit date: 04/16/2009    Years since quitting: 12.1   Smokeless tobacco: Never  Substance and Sexual Activity   Alcohol use: Yes    Alcohol/week: 3.0 - 4.0 standard drinks    Types: 3 - 4 Standard drinks or equivalent per week    Comment: occ   Drug use: Yes    Types: Marijuana    Comment: marijuana every day   Sexual activity: Not on file  Other  Topics Concern   Not on file  Social History Narrative   Not on file   Social Determinants of Health   Financial Resource Strain: Low Risk    Difficulty of Paying Living Expenses: Not hard at all  Food Insecurity: No Food Insecurity   Worried About Charity fundraiser in the Last Year: Never true   Ran Out of Food in the Last Year: Never true  Transportation Needs: No Transportation Needs   Lack of Transportation (Medical): No   Lack of Transportation (Non-Medical): No  Physical Activity: Insufficiently Active   Days of Exercise per Week: 3 days   Minutes of Exercise per Session: 30 min  Stress: No Stress Concern Present   Feeling of Stress : Not at all  Social Connections: Unknown   Frequency of Communication with Friends and Family: More than three times a week   Frequency of Social Gatherings with Friends and Family: More than three times a week   Attends Religious Services: Not on Electrical engineer or Organizations: Not on file   Attends Archivist Meetings: Not on file   Marital Status: Not on file    Tobacco Counseling Counseling given: Not Answered   Clinical Intake:  Pre-visit preparation completed: Yes        Diabetes: No  How often do you need to have someone help you when you read instructions, pamphlets, or other written materials from your doctor or pharmacy?: 1 - Never Interpreter Needed?: No      Activities of Daily Living In your present state of health, do you have any difficulty performing the following activities: 05/23/2021  Hearing? N  Vision? N  Difficulty concentrating or making decisions? N  Walking or climbing stairs? N  Dressing or bathing? N  Doing errands, shopping? N  Preparing Food and eating ? N  Using the Toilet? N  In the past six months, have you accidently leaked urine? N  Do you have problems with loss of bowel control? N  Managing your Medications? N  Managing your Finances? N  Housekeeping or  managing your Housekeeping? N  Some recent data might be hidden    Patient Care Team: Einar Pheasant, MD as PCP - General (Internal Medicine) Einar Pheasant, MD (Internal Medicine) Bary Castilla [place] Gleason, MD (General Surgery)  Indicate any recent Medical Services you may have received from other than [facility] providers in the past year (date may be approximate).     Assessment:   This is a routine wellness examination for [place].  Virtual Visit via Telephone Note  I connected with  Jessica Wall on 05/23/21 at  2:45 PM EST by telephone and verified that I am speaking with the correct person using two identifiers.  Persons participating in the virtual visit: [place]  I discussed the limitations, risks, security and privacy concerns of performing an evaluation and management service by telephone and the availability of in person appointments. The patient expressed understanding and agreed to proceed.  Interactive audio and video telecommunications were attempted between this nurse and patient, however failed, due to patient having technical difficulties OR patient did not have access to video capability.  We continued and completed visit with audio only.  Some vital signs may be absent or patient reported.   Hearing/Vision screen Hearing Screening - Comments::  Patient is able to hear conversational tones without difficulty. No issues reported. Vision Screening - Comments:: They have seen their ophthalmologist in the last 12 months.   Dietary issues and exercise activities discussed: Current Exercise Habits: Home exercise routine, Intensity: Mild Healthy diet Good water intake   Goals Addressed             This Visit's Progress    Follow up with Primary Care Provider       As needed Schedule mammogram Schedule eye exam       Depression Screen PHQ 2/9 Scores 05/23/2021 02/22/2021 11/16/2020 03/18/2020 09/08/2019 03/18/2019 08/27/2018  PHQ - 2 Score 0 4 0 0 0  0 0    Fall Risk Fall Risk  05/23/2021 02/22/2021 11/16/2020 03/18/2020 09/08/2019  Falls in the past year? 0 0 0 0 0  Number falls in past yr: 0 0 0 0 -  Injury with Fall? - 0 0 0 -  Risk for fall due to : - No Fall Risks - - -  Follow up Falls evaluation completed Falls evaluation completed Falls evaluation completed Falls evaluation completed Falls evaluation completed    [place]: Home free of loose throw rugs in walkways, pet beds, electrical cords, etc? Yes  Adequate lighting in your home to reduce risk of falls? Yes   ASSISTIVE DEVICES UTILIZED TO PREVENT FALLS: Life alert? No  Use of a cane, walker or w/c? No   TIMED UP AND GO: Was the test performed? No .   Cognitive Function:  Patient is alert and oriented x3.    6CIT Screen 03/18/2019  What Year? 0 points  What month? 0 points  What time? 0 points  Count back from 20 0 points  Months in reverse 0 points  Repeat phrase 0 points  Total Score 0   Immunizations There is no immunization history on file for this patient.  Screening Tests Health Maintenance  Topic Date Due   MAMMOGRAM  02/02/2021   Zoster Vaccines- Shingrix (1 of 2) 05/25/2021 (Originally 06/27/1997)   INFLUENZA VACCINE  07/14/2021 (Originally 11/14/2020)   Pneumonia Vaccine 75+ Years old (1 - PCV) 02/22/2022 (Originally 06/27/2012)   DEXA SCAN  05/19/2024 (Originally 06/27/2012)   TETANUS/TDAP  05/19/2024 (Originally 06/28/1966)   COLONOSCOPY (Pts 45-65yrs Insurance coverage will need to be confirmed)  10/27/2023   Hepatitis C Screening  Completed   HPV VACCINES  Aged Out   COVID-19 Vaccine  Discontinued   Health Maintenance Health Maintenance Due  Topic Date Due   MAMMOGRAM  02/02/2021   Mammogram- plans to schedule at a later date.   Lung Cancer Screening: (Low Dose CT Chest recommended if Age 77-80 years, 30 pack-year currently smoking OR have quit w/in 15years.) does not qualify.   Vision Screening: Recommended  annual ophthalmology exams for early detection of glaucoma and other disorders of the eye.  Dental Screening: Recommended annual dental exams for proper oral hygiene  Community Resource Referral / Chronic Care Management: CRR required this visit?  No   CCM required this visit?  No      Plan:   Keep all routine maintenance appointments.   I have personally reviewed and noted the following in the patients chart:   Medical and social history Use of alcohol, tobacco or illicit drugs  Current medications and supplements including opioid prescriptions. Not taking opioid.  Functional ability and status Nutritional status Physical activity Advanced directives List of other physicians Hospitalizations, surgeries, and ER visits in previous 12 months Vitals Screenings to include cognitive, depression, and falls Referrals and appointments  In addition, I have reviewed and discussed with patient certain preventive protocols, quality metrics, and best practice recommendations. A written personalized care plan for preventive services as well as general preventive health recommendations were provided to patient.     Varney Biles, LPN   06/21/8873

## 2021-05-23 NOTE — Patient Instructions (Addendum)
°  Jessica Wall , Thank you for taking time to come for your Medicare Wellness Visit. I appreciate your ongoing commitment to your health goals. Please review the following plan we discussed and let me know if I can assist you in the future.   These are the goals we discussed:  Goals      Follow up with Primary Care Provider     As needed Schedule mammogram Schedule eye exam        This is a list of the screening recommended for you and due dates:  Health Maintenance  Topic Date Due   Mammogram  02/02/2021   Zoster (Shingles) Vaccine (1 of 2) 05/25/2021*   Flu Shot  07/14/2021*   Pneumonia Vaccine (1 - PCV) 02/22/2022*   DEXA scan (bone density measurement)  05/19/2024*   Tetanus Vaccine  05/19/2024*   Colon Cancer Screening  10/27/2023   Hepatitis C Screening: USPSTF Recommendation to screen - Ages 18-79 yo.  Completed   HPV Vaccine  Aged Out   COVID-19 Vaccine  Discontinued  *Topic was postponed. The date shown is not the original due date.

## 2021-05-31 DIAGNOSIS — Z7960 Long term (current) use of unspecified immunomodulators and immunosuppressants: Secondary | ICD-10-CM | POA: Diagnosis not present

## 2021-05-31 DIAGNOSIS — R7989 Other specified abnormal findings of blood chemistry: Secondary | ICD-10-CM | POA: Diagnosis not present

## 2021-05-31 DIAGNOSIS — M316 Other giant cell arteritis: Secondary | ICD-10-CM | POA: Diagnosis not present

## 2021-05-31 DIAGNOSIS — M8588 Other specified disorders of bone density and structure, other site: Secondary | ICD-10-CM | POA: Diagnosis not present

## 2021-06-07 ENCOUNTER — Emergency Department
Admission: EM | Admit: 2021-06-07 | Discharge: 2021-06-07 | Disposition: A | Discharge status: Home / Self Care | Payer: No Typology Code available for payment source | Attending: Emergency Medicine | Admitting: Emergency Medicine

## 2021-06-07 ENCOUNTER — Emergency Department: Payer: No Typology Code available for payment source

## 2021-06-07 ENCOUNTER — Other Ambulatory Visit: Payer: Self-pay

## 2021-06-07 DIAGNOSIS — R7989 Other specified abnormal findings of blood chemistry: Secondary | ICD-10-CM | POA: Diagnosis not present

## 2021-06-07 DIAGNOSIS — E871 Hypo-osmolality and hyponatremia: Secondary | ICD-10-CM | POA: Insufficient documentation

## 2021-06-07 DIAGNOSIS — E860 Dehydration: Secondary | ICD-10-CM | POA: Diagnosis not present

## 2021-06-07 DIAGNOSIS — I10 Essential (primary) hypertension: Secondary | ICD-10-CM | POA: Diagnosis not present

## 2021-06-07 DIAGNOSIS — R197 Diarrhea, unspecified: Secondary | ICD-10-CM

## 2021-06-07 DIAGNOSIS — Z20822 Contact with and (suspected) exposure to covid-19: Secondary | ICD-10-CM | POA: Diagnosis not present

## 2021-06-07 DIAGNOSIS — R531 Weakness: Secondary | ICD-10-CM | POA: Insufficient documentation

## 2021-06-07 DIAGNOSIS — N179 Acute kidney failure, unspecified: Secondary | ICD-10-CM | POA: Diagnosis not present

## 2021-06-07 DIAGNOSIS — R000 Tachycardia, unspecified: Secondary | ICD-10-CM | POA: Diagnosis not present

## 2021-06-07 DIAGNOSIS — R42 Dizziness and giddiness: Secondary | ICD-10-CM | POA: Diagnosis not present

## 2021-06-07 LAB — URINALYSIS, COMPLETE (UACMP) WITH MICROSCOPIC
Bilirubin Urine: NEGATIVE
Glucose, UA: 50 mg/dL — AB
Ketones, ur: NEGATIVE mg/dL
Leukocytes,Ua: NEGATIVE
Nitrite: NEGATIVE
Protein, ur: NEGATIVE mg/dL
Specific Gravity, Urine: 1.003 — ABNORMAL LOW (ref 1.005–1.030)
pH: 5 (ref 5.0–8.0)

## 2021-06-07 LAB — CBC WITH DIFFERENTIAL/PLATELET
Abs Immature Granulocytes: 0.45 10*3/uL — ABNORMAL HIGH (ref 0.00–0.07)
Basophils Absolute: 0.1 10*3/uL (ref 0.0–0.1)
Basophils Relative: 1 %
Eosinophils Absolute: 0 10*3/uL (ref 0.0–0.5)
Eosinophils Relative: 0 %
HCT: 33.9 % — ABNORMAL LOW (ref 36.0–46.0)
Hemoglobin: 11.3 g/dL — ABNORMAL LOW (ref 12.0–15.0)
Immature Granulocytes: 4 %
Lymphocytes Relative: 21 %
Lymphs Abs: 2.2 10*3/uL (ref 0.7–4.0)
MCH: 33.2 pg (ref 26.0–34.0)
MCHC: 33.3 g/dL (ref 30.0–36.0)
MCV: 99.7 fL (ref 80.0–100.0)
Monocytes Absolute: 0.3 10*3/uL (ref 0.1–1.0)
Monocytes Relative: 3 %
Neutro Abs: 7.4 10*3/uL (ref 1.7–7.7)
Neutrophils Relative %: 71 %
Platelets: 154 10*3/uL (ref 150–400)
RBC: 3.4 MIL/uL — ABNORMAL LOW (ref 3.87–5.11)
RDW: 20.7 % — ABNORMAL HIGH (ref 11.5–15.5)
WBC: 10.4 10*3/uL (ref 4.0–10.5)
nRBC: 1.1 % — ABNORMAL HIGH (ref 0.0–0.2)

## 2021-06-07 LAB — PHOSPHORUS: Phosphorus: 2.7 mg/dL (ref 2.5–4.6)

## 2021-06-07 LAB — COMPREHENSIVE METABOLIC PANEL
ALT: 37 U/L (ref 0–44)
AST: 30 U/L (ref 15–41)
Albumin: 3 g/dL — ABNORMAL LOW (ref 3.5–5.0)
Alkaline Phosphatase: 51 U/L (ref 38–126)
Anion gap: 7 (ref 5–15)
BUN: 21 mg/dL (ref 8–23)
CO2: 22 mmol/L (ref 22–32)
Calcium: 7.8 mg/dL — ABNORMAL LOW (ref 8.9–10.3)
Chloride: 104 mmol/L (ref 98–111)
Creatinine, Ser: 1.38 mg/dL — ABNORMAL HIGH (ref 0.44–1.00)
GFR, Estimated: 40 mL/min — ABNORMAL LOW (ref >60)
Glucose, Bld: 118 mg/dL — ABNORMAL HIGH (ref 70–99)
Potassium: 3.6 mmol/L (ref 3.5–5.1)
Sodium: 133 mmol/L — ABNORMAL LOW (ref 135–145)
Total Bilirubin: 0.9 mg/dL (ref 0.3–1.2)
Total Protein: 5.9 g/dL — ABNORMAL LOW (ref 6.5–8.1)

## 2021-06-07 LAB — RESP PANEL BY RT-PCR (FLU A&B, COVID) ARPGX2
Influenza A by PCR: NEGATIVE
Influenza B by PCR: NEGATIVE
SARS Coronavirus 2 by RT PCR: NEGATIVE

## 2021-06-07 LAB — LACTIC ACID, PLASMA
Lactic Acid, Venous: 1.7 mmol/L (ref 0.5–1.9)
Lactic Acid, Venous: 1.2 mmol/L (ref 0.5–1.9)

## 2021-06-07 LAB — PROTIME-INR
INR: 1.2 (ref 0.8–1.2)
Prothrombin Time: 15 seconds (ref 11.4–15.2)

## 2021-06-07 LAB — TSH: TSH: 10.211 u[IU]/mL — ABNORMAL HIGH (ref 0.350–4.500)

## 2021-06-07 LAB — MAGNESIUM: Magnesium: 1.8 mg/dL (ref 1.7–2.4)

## 2021-06-07 LAB — APTT: aPTT: 27 seconds (ref 24–36)

## 2021-06-07 LAB — T4, FREE: Free T4: 1.12 ng/dL (ref 0.61–1.12)

## 2021-06-07 MED ORDER — LACTATED RINGERS IV BOLUS
500.0000 mL | Freq: Once | INTRAVENOUS | Status: AC
  Administered 2021-06-07: 500 mL via INTRAVENOUS

## 2021-06-07 MED ORDER — LACTATED RINGERS IV BOLUS
1000.0000 mL | Freq: Once | INTRAVENOUS | Status: AC
  Administered 2021-06-07: 1000 mL via INTRAVENOUS

## 2021-06-07 MED ORDER — HYDROCORTISONE SOD SUC (PF) 100 MG IJ SOLR
100.0000 mg | INTRAMUSCULAR | Status: AC
  Administered 2021-06-07: 100 mg via INTRAVENOUS
  Filled 2021-06-07: qty 2

## 2021-06-07 MED ORDER — LOPERAMIDE HCL 2 MG PO TABS: 2.0000 mg | ORAL_TABLET | Freq: Three times a day (TID) | ORAL | 0 refills | Status: AC | PRN

## 2021-06-07 NOTE — ED Triage Notes (Addendum)
Pt came from home called out for weakness and dizziness. Pt had initial bp of 80's/50's when standing. Patient had stared a new dosage of prednisone. Pt had a right side head biopsy to prevent a stroke by dr patel recently. Pt alert and orineted x4. Pt was tachycardic and had a fever of 100.5 per ems but our temp in room was 98.4 orally.

## 2021-06-07 NOTE — ED Provider Notes (Signed)
[facility] Provider Note    Event Date/Time   First MD Initiated Contact with Patient 06/07/21 0030     (approximate)   History   Weakness   HPI  Jessica Wall is a 76 y.o. 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  Patient denies any fevers, cough, chest pain, nausea, vomiting, headache, earache, sore throat, vision changes or vertigo.  She denies any urinary symptoms.  He states he felt lightheaded earlier today and has had decreased appetite and feels some tingling in her fingertips but denies any other weakness numbness tingling or any other clear associated sick symptoms.  On review of records including office visit on 2/15 it seems her dose of prednisone was decreased from 25 mg/day to 20 mg/day with instructions to taper by 5 mg weekly.  On  visit note there is also mention that patient is on methotrexate although she is not sure if she is taking this or not.      Physical Exam  Triage Vital Signs: ED Triage Vitals  Enc Vitals Group     BP      Pulse      Resp      Temp      Temp src      SpO2      Weight      Height      Head Circumference      Peak Flow      Pain Score      Pain Loc      Pain Edu?      Excl. in [place]?     Most recent vital signs: Vitals:   06/07/21 0230 06/07/21 0245  BP: 101/70 100/64  Pulse: 90 77  Resp: (!) 21 19  Temp:    SpO2: 97% 96%    General: Awake, no distress.  CV:  No significant murmur.  Slightly tachycardic.  Prolonged capillary refill. Resp:  Normal effort.  Abd:  No distention.  Soft throughout. Other:  Strength in all extremities.  Sensation is intact to light touch all extremities.  PERRLA.  EOMI.   ED Results / Procedures / Treatments   Labs (all labs ordered are listed, but only abnormal results are displayed) Labs Reviewed  COMPREHENSIVE METABOLIC PANEL - Abnormal; Notable for the following components:      Result Value   Sodium 133 (*)    Glucose, Bld 118 (*)    Creatinine, Ser 1.38 (*)    Calcium 7.8 (*)    Total Protein 5.9 (*)    Albumin 3.0 (*)    GFR, Estimated 40 (*)    All other components within normal limits  CBC WITH DIFFERENTIAL/PLATELET - Abnormal; Notable for the following components:   RBC 3.40 (*)    Hemoglobin 11.3 (*)    HCT 33.9 (*)    RDW 20.7 (*)    nRBC 1.1 (*)    Abs Immature Granulocytes 0.45 (*)    All other components within normal limits  URINALYSIS, COMPLETE (UACMP) WITH MICROSCOPIC - Abnormal; Notable for the following components:   Color, Urine STRAW (*)    APPearance CLEAR (*)    Specific Gravity, Urine 1.003 (*)    Glucose, UA 50 (*)    Hgb urine dipstick  SMALL (*)    Bacteria, UA FEW (*)    All other components within normal limits  TSH - Abnormal; Notable for the following components:   TSH 10.211 (*)    All other components within normal limits  RESP PANEL BY RT-PCR (FLU A&B, COVID) ARPGX2  CULTURE, BLOOD (ROUTINE X 2)  CULTURE, BLOOD (ROUTINE X 2)  URINE CULTURE  GASTROINTESTINAL PANEL BY PCR, STOOL (REPLACES STOOL CULTURE)  C DIFFICILE QUICK SCREEN W PCR REFLEX    LACTIC ACID, PLASMA  LACTIC ACID, PLASMA  PROTIME-INR  APTT  MAGNESIUM  T4, FREE  PHOSPHORUS  T3, FREE     EKG  EKG is remarkable for sinus rhythm with a ventricular rate of 110, normal axis, unremarkable intervals without clear evidence of acute ischemia or significant arrhythmia.   RADIOLOGY Chest reviewed by myself shows no focal consoidation, effusion, edema, pneumothorax or other clear acute thoracic process. I also reviewed radiology interpretation and agree with findings described.     PROCEDURES:  Critical Care performed: No  .1-3 Lead EKG Interpretation Performed by: Lucrezia Starch, MD Authorized by: Lucrezia Starch, MD     Interpretation: normal     ECG rate assessment: normal     Rhythm: sinus rhythm     Ectopy: none     Conduction: normal    The patient is on the cardiac monitor to evaluate for evidence of arrhythmia and/or significant heart rate changes.   MEDICATIONS ORDERED IN ED: Medications  hydrocortisone sodium succinate (SOLU-CORTEF) 100 MG injection 100 mg (has no administration in time range)  lactated ringers bolus 500 mL (has no administration in time range)  lactated ringers bolus 1,000 mL (1,000 mLs Intravenous Bolus 06/07/21 0057)  lactated ringers bolus 1,000 mL (1,000 mLs Intravenous Bolus 06/07/21 0157)     IMPRESSION / MDM / ASSESSMENT AND PLAN / ED COURSE  I reviewed the triage vital signs and the nursing notes.                              Differential diagnosis includes, but is not limited to dehydration secondary to acute infectious enteritis, acute viral process such as COVID or flu, metabolic derangements, arrhythmia, anemia, and adrenal insufficiency.  No historical or exam features at this time to suggest a CVA, trauma nevertheless patient for toxic ingestion or withdrawal.  Patient denies any other recent medication changes.  EKG is remarkable for sinus rhythm with a ventricular rate of 110, normal axis, unremarkable intervals without clear evidence of acute ischemia or significant arrhythmia.  Chest reviewed by myself shows no focal consoidation, effusion, edema, pneumothorax or other clear acute thoracic process. I also reviewed radiology interpretation and agree with findings described.  CMP is remarkable for a sodium of 133, creatinine 1.38 compared to 1.1 obtained on 12/30 suggesting an AKI today.  Albumin is slightly low at 3 and there are no other significant electrolyte or metabolic derangements.  Lactic acid is not elevated.  Coagulation studies are unremarkable.  Magnesium is within normal limits.  Phosphorus is  within normal labs.  COVID and influenza PCR is negative.  UA has small hemoglobin and a few bacteria as well as 50 glucose but no other evidence of infection.  Patient's tachycardia seem to improve with IV fluids although she persistently remains borderline hypertensive.  Given absence of fever, leukocytosis and nonelevated lactic with patient denying any pain or other acute sick symptoms I feel low  suspicion for sepsis at this time.  I suspect dehydration in the setting of infectious enteritis and possibly component of some adrenal insufficiency as well.  We will give a dose of stress dose steroids.  Of note patient is on Synthroid for hypothyroidism but her TSH is elevated at 10.2.  She states she has been taking her Synthroid as directed.  Free T4 is within normal limits and overall this is not suggestive of myxedema coma.  UA with small hemoglobin and few bacteria but no other evidence of cystitis.  On reassessment patient is normotensive with no tachycardia and states her dizziness and weakness have resolved.  I suspect she was acutely dehydrated in the setting of presumed infectious enteritis.  At this point I do not believe she is septic or bacteremic.  I offered admission for further hydration and monitoring of her kidney function although she states she wishes to go home.  Given stable vitals recover hours of observation with patient denying any other symptoms I think this is reasonable.  She is unable to write a stool sample emergency room.  Discharged in stable condition.  Strict return precautions advised and discussed.  Instructed follow-up with PCP to have kidney function rechecked in 2 days and to hydrate.  Rx for Imodium prescribed as needed for any significant diarrhea although advised patient to return to emergency room for any new or significant worsening of symptoms.  Discharged in stable condition.      FINAL CLINICAL IMPRESSION(S) / ED DIAGNOSES   Final diagnoses:  Diarrhea of  presumed infectious origin  Dehydration  AKI (acute kidney injury) ([place])     Rx / DC Orders   ED Discharge Orders     None        Note:  This document was prepared using Dragon voice recognition software and may include unintentional dictation errors.   Lucrezia Starch, MD 06/07/21 [phone]

## 2021-06-07 NOTE — ED Notes (Signed)
Pt was in and out cathed by telka RN and laura NT and lisa NT

## 2021-06-07 NOTE — ED Notes (Signed)
Purewick in place for urine collection

## 2021-06-08 LAB — T3, FREE: T3, Free: 1.7 pg/mL — ABNORMAL LOW (ref 2.0–4.4)

## 2021-06-09 ENCOUNTER — Telehealth: Payer: Self-pay | Admitting: Internal Medicine

## 2021-06-09 DIAGNOSIS — D649 Anemia, unspecified: Secondary | ICD-10-CM

## 2021-06-09 DIAGNOSIS — I10 Essential (primary) hypertension: Secondary | ICD-10-CM

## 2021-06-09 DIAGNOSIS — E039 Hypothyroidism, unspecified: Secondary | ICD-10-CM

## 2021-06-09 LAB — URINE CULTURE: Culture: >=100000 — AB

## 2021-06-09 NOTE — Telephone Encounter (Signed)
Jessica Wall had labs through ER.  (Was seen recently).  They contacted me - her TSH was elevated.  Need to confirm that she is taking her synthroid 17mcg q day and taking correctly.  Need to increase synthroid to 69mcg q day.  Check tsh , cbc and iron studies in 6 weeks.  In reviewing the labs and note, she also needs a f/u met b (per their recommendation in 2 days after f/u) - which would be 06/09/21.  Please schedule for f/u met b.

## 2021-06-09 NOTE — Telephone Encounter (Signed)
Attempted to reach but kept getting message "call could not be completed at this time"

## 2021-06-12 ENCOUNTER — Other Ambulatory Visit: Payer: Self-pay

## 2021-06-12 LAB — CULTURE, BLOOD (ROUTINE X 2)
Culture: NO GROWTH
Culture: NO GROWTH

## 2021-06-12 MED ORDER — LEVOTHYROXINE SODIUM 75 MCG PO TABS: 75.0000 ug | ORAL_TABLET | Freq: Every day | ORAL | 1 refills | Status: DC

## 2021-06-12 NOTE — Addendum Note (Signed)
Addended by: Lars Masson on: 06/12/2021 08:56 AM   Modules accepted: Orders

## 2021-06-12 NOTE — Telephone Encounter (Signed)
Spoke with patient. Had to call husband because her phone has been disconnected. Patient refused to come in and have follow up blood work. Per patient "the medication they are giving her (prednisone) has taken a toll on her and she doesn't feel like doing anything" Advised that it is important to have blood work done to recheck levels. Patient continued to refuse. Advised that I would let Dr Nicki Reaper know. Patient did agree to increase her synthroid and repeat blood work in 6 weeks depending on how she feels. Confirmed nothing acute going on patient stated she has felt like this since starting prednisone last year and she is not going to do anything she doesn't feel like doing. Sent in new dose of levothyroxine and ordered labs

## 2021-06-12 NOTE — Telephone Encounter (Signed)
See if she would be agreeable to at least schedule a phone  or virtual visit with me to discuss.

## 2021-06-13 NOTE — Telephone Encounter (Signed)
FYI patient scheduled 3/2 at 11:30.

## 2021-06-15 ENCOUNTER — Ambulatory Visit (INDEPENDENT_AMBULATORY_CARE_PROVIDER_SITE_OTHER): Payer: No Typology Code available for payment source | Admitting: Internal Medicine

## 2021-06-15 ENCOUNTER — Encounter: Payer: Self-pay | Admitting: Internal Medicine

## 2021-06-15 ENCOUNTER — Telehealth: Payer: Self-pay | Admitting: Internal Medicine

## 2021-06-15 DIAGNOSIS — R944 Abnormal results of kidney function studies: Secondary | ICD-10-CM | POA: Diagnosis not present

## 2021-06-15 DIAGNOSIS — R55 Syncope and collapse: Secondary | ICD-10-CM

## 2021-06-15 DIAGNOSIS — E039 Hypothyroidism, unspecified: Secondary | ICD-10-CM

## 2021-06-15 DIAGNOSIS — I10 Essential (primary) hypertension: Secondary | ICD-10-CM

## 2021-06-15 DIAGNOSIS — D649 Anemia, unspecified: Secondary | ICD-10-CM

## 2021-06-15 DIAGNOSIS — M316 Other giant cell arteritis: Secondary | ICD-10-CM | POA: Diagnosis not present

## 2021-06-15 NOTE — Telephone Encounter (Signed)
Please schedule Jessica Wall to come in next week (needs appt before 11:30) - prefer Monday or Tuesday.  Non fasting lab.  Just needs met b.  Please make note on schedule.   ?

## 2021-06-15 NOTE — Progress Notes (Signed)
[name], female   DOB: 01/25/1946, 76 y.o.   MRN: [id number] ? ? ?Virtual Visit via telephone Note ? ?This visit type was conducted due to national recommendations for restrictions regarding the COVID-19 pandemic (e.g. social distancing).  This format is felt to be most appropriate for this patient at this time.  All issues noted in this document were discussed and addressed.  No physical exam was performed (except for noted visual exam findings with Video Visits).  ? ?I connected with Jessica Wall by telephone and verified that I am speaking with the correct person using two identifiers. ?Location patient: home ?Location provider: work  ?Persons participating in the telephone visit: patient, provider ? ?The limitations, risks, security and privacy concerns of performing an evaluation and management service by telephone and the availability of in person appointments have been discussed.  It has also been discussed with the patient that there may be a patient responsible charge related to this service. The patient expressed understanding and agreed to proceed. ? ? ?Reason for visit: work in appt ? ?HPI: ?Work in - ER follow up.  Was seen in ER 06/07/21 - near syncope.  ER notes reviewed.  Had couple days of diarrhea with light headedness and dizziness.  States she was using the bathroom and had a near syncopal episode.  Her husband caught her - did not hit floor.  EKG and CXR - unrevealing.  Labs - decreased GFR, low sodium.  Covid and flu PCR - negative.  Was initially tachycardic.  Was given IVFs.  Heart rate improved.  Was discharged home.  Since being home she reports feeling some better.  She is eating a little more.  No nausea or vomiting.  Normal bowel movement this am.  Smell and taste have changed.  States her face is swollen - related to the prednisone.  No lip or tongue swelling.  No sob.  Hands tingling - worse at times.  States feel all of this started since starting prednisone.  She is  seeing rheumatology and on prednisone - slow taper - for TA.   ? ? ?ROS: See pertinent positives and negatives per HPI. ? ?Past Medical History:  ?Diagnosis Date  ? GERD (gastroesophageal reflux disease)   ? Goiter   ? s/p thyroidectomy  ? H/O syncope   ? Hypercholesterolemia   ? Hypothyroidism   ? Recurrent sinus infections   ? and allergy problems  ? ? ?Past Surgical History:  ?Procedure Laterality Date  ? ARTERY BIOPSY Right 03/13/2021  ? Procedure: BIOPSY TEMPORAL ARTERY;  Surgeon: Robert Bellow, MD;  Location: [facility] ORS;  Service: General;  Laterality: Right;  ? BREAST BIOPSY    ? COLONOSCOPY WITH PROPOFOL N/A 11/03/2014  ? Procedure: COLONOSCOPY WITH PROPOFOL;  Surgeon: Manya Silvas, MD;  Location: [facility] ENDOSCOPY;  Service: Endoscopy;  Laterality: N/A;  ? COLONOSCOPY WITH PROPOFOL N/A 02/12/2018  ? Procedure: COLONOSCOPY WITH PROPOFOL;  Surgeon: Manya Silvas, MD;  Location: [facility] ENDOSCOPY;  Service: Endoscopy;  Laterality: N/A;  ? [place]  ? ESOPHAGOGASTRODUODENOSCOPY N/A 11/03/2014  ? Procedure: ESOPHAGOGASTRODUODENOSCOPY (EGD);  Surgeon: Manya Silvas, MD;  Location: [facility] ENDOSCOPY;  Service: Endoscopy;  Laterality: N/A;  ? LAPAROSCOPIC APPENDECTOMY N/A 11/26/2014  ? Procedure: APPENDECTOMY LAPAROSCOPIC;  Surgeon: Robert Bellow, MD;  Location: [facility] ORS;  Service: General;  Laterality: N/A;  ? thyroidectomy  1979  ? goiter  ? TUBAL LIGATION  1975  ? ? ?  Family History  ?Problem Relation Age of Onset  ? Tuberculosis Paternal Grandfather   ? Alzheimer's disease Mother   ? Goiter Mother   ? Cancer Brother   ? Cancer Brother   ? Cancer Brother   ? Cancer Brother   ? ? ?SOCIAL HX: reviewed.  ? ? ?Current Outpatient Medications:  ?  amLODipine (NORVASC) 5 MG tablet, TAKE 1 TABLET BY MOUTH EVERY DAY, Disp: 90 tablet, Rfl: 1 ?  cyanocobalamin 1000 MCG tablet, Take 1,000 mcg by mouth daily., Disp: , Rfl:  ?  levothyroxine (SYNTHROID) 75 MCG tablet, Take 1 tablet  (75 mcg total) by mouth daily., Disp: 90 tablet, Rfl: 1 ?  lisinopril (ZESTRIL) 40 MG tablet, TAKE 1 TABLET BY MOUTH EVERY DAY, Disp: 90 tablet, Rfl: 1 ?  predniSONE (DELTASONE) 20 MG tablet, TAKE 3 TABLETS BY MOUTH EVERY DAY (Patient taking differently: 5 mg. TAKE 3 TABLETS BY MOUTH EVERY DAY), Disp: 90 tablet, Rfl: 1 ?  rosuvastatin (CRESTOR) 10 MG tablet, TAKE 1 TABLET BY MOUTH EVERY DAY, Disp: 90 tablet, Rfl: 3 ? ?EXAM: ? ?GENERAL: alert,. Sounds to be in no acute distress.  Answering questions appropriately.  ? ?PSYCH/NEURO: pleasant and cooperative, no obvious depression or anxiety, speech and thought processing grossly intact ? ?ASSESSMENT AND PLAN: ? ?Discussed the following assessment and plan: ? ?Problem List Items Addressed This Visit   ? ? Anemia  ?  Follow CBC. ?  ?  ? Decreased GFR  ?  Found to have acute kidney injury recently through ER evaluation.  She is eating better.  Discussed the need to stay hydrated.  Discussed importance of rechecking her labs.  She did agree to come in next week for blood check. ?  ?  ? Relevant Orders  ? Basic metabolic panel  ? Essential hypertension, benign  ?  Blood pressure has been doing well.  She continues on lisinopril and amlodipine.  She is eating better now.  Follow blood pressures.  Need to get her back in for a follow-up metabolic panel. ?  ?  ? Hypothyroidism  ?  TSH was elevated in the emergency room.  Synthroid dose has been adjusted.  Scheduled for a follow-up TSH. ?  ?  ? Near syncope  ?  Recently evaluated in the emergency room for near syncopal episode as outlined.  Felt to be related to dehydration.  Episode occurred while she was using the bathroom.  She has had no chest pain or shortness of breath.  No further episodes.  Is eating better.  Follow. ?  ?  ? Temporal arteritis ([place])  ?  Seeing Dr Posey Pronto for f/u of her temporal arteritis.  She feels a lot of her current symptoms started after starting the prednisone.  We did discuss the side effects of  prednisone therapy.  Discussed the changes in her face probably are related to the increased dose of prednisone.  She is gradually tapering down the dose.  We discussed at length regarding the importance of not stopping the medication.  She will continue her taper as outlined by Dr. Posey Pronto.  Expressed understanding of not stopping the medication.  No headache. ?  ?  ? ? ?Return if symptoms worsen or fail to improve. ?  ?I discussed the assessment and treatment plan with the patient. The patient was provided an opportunity to ask questions and all were answered. The patient agreed with the plan and demonstrated an understanding of the instructions. ?  ?The patient was advised  to call back or seek an in-person evaluation if the symptoms worsen or if the condition fails to improve as anticipated. ? ?I provided 25 minutes of non-face-to-face time during this encounter. ? ? ?Einar Pheasant, MD   ?

## 2021-06-16 ENCOUNTER — Other Ambulatory Visit: Payer: Self-pay

## 2021-06-16 DIAGNOSIS — R944 Abnormal results of kidney function studies: Secondary | ICD-10-CM

## 2021-06-16 DIAGNOSIS — I10 Essential (primary) hypertension: Secondary | ICD-10-CM

## 2021-06-16 NOTE — Telephone Encounter (Signed)
Pt has lab appt on 3/7 & note added to only draw Met B. ?

## 2021-06-18 ENCOUNTER — Encounter: Payer: Self-pay | Admitting: Internal Medicine

## 2021-06-18 NOTE — Assessment & Plan Note (Signed)
Follow CBC. 

## 2021-06-18 NOTE — Assessment & Plan Note (Signed)
TSH was elevated in the emergency room.  Synthroid dose has been adjusted.  Scheduled for a follow-up TSH. ?

## 2021-06-18 NOTE — Assessment & Plan Note (Signed)
Seeing Dr Posey Pronto for f/u of her temporal arteritis.  She feels a lot of her current symptoms started after starting the prednisone.  We did discuss the side effects of prednisone therapy.  Discussed the changes in her face probably are related to the increased dose of prednisone.  She is gradually tapering down the dose.  We discussed at length regarding the importance of not stopping the medication.  She will continue her taper as outlined by Dr. Posey Pronto.  Expressed understanding of not stopping the medication.  No headache. ?

## 2021-06-18 NOTE — Assessment & Plan Note (Signed)
Found to have acute kidney injury recently through ER evaluation.  She is eating better.  Discussed the need to stay hydrated.  Discussed importance of rechecking her labs.  She did agree to come in next week for blood check. ?

## 2021-06-18 NOTE — Assessment & Plan Note (Signed)
Blood pressure has been doing well.  She continues on lisinopril and amlodipine.  She is eating better now.  Follow blood pressures.  Need to get her back in for a follow-up metabolic panel. ?

## 2021-06-18 NOTE — Assessment & Plan Note (Signed)
Recently evaluated in the emergency room for near syncopal episode as outlined.  Felt to be related to dehydration.  Episode occurred while she was using the bathroom.  She has had no chest pain or shortness of breath.  No further episodes.  Is eating better.  Follow. ?

## 2021-06-20 ENCOUNTER — Other Ambulatory Visit: Payer: Self-pay

## 2021-06-20 ENCOUNTER — Other Ambulatory Visit (INDEPENDENT_AMBULATORY_CARE_PROVIDER_SITE_OTHER): Payer: No Typology Code available for payment source

## 2021-06-20 DIAGNOSIS — R944 Abnormal results of kidney function studies: Secondary | ICD-10-CM | POA: Diagnosis not present

## 2021-06-20 DIAGNOSIS — I10 Essential (primary) hypertension: Secondary | ICD-10-CM

## 2021-06-20 LAB — BASIC METABOLIC PANEL
BUN: 25 mg/dL — ABNORMAL HIGH (ref 6–23)
CO2: 27 mEq/L (ref 19–32)
Calcium: 8.6 mg/dL (ref 8.4–10.5)
Chloride: 106 mEq/L (ref 96–112)
Creatinine, Ser: 1.2 mg/dL (ref 0.40–1.20)
GFR: 44.76 mL/min — ABNORMAL LOW (ref >60.00)
Glucose, Bld: 85 mg/dL (ref 70–99)
Potassium: 5.1 mEq/L (ref 3.5–5.1)
Sodium: 139 mEq/L (ref 135–145)

## 2021-06-22 ENCOUNTER — Other Ambulatory Visit: Payer: Self-pay | Admitting: Internal Medicine

## 2021-07-14 ENCOUNTER — Encounter: Payer: Self-pay | Admitting: *Deleted

## 2021-07-24 ENCOUNTER — Other Ambulatory Visit: Payer: No Typology Code available for payment source

## 2021-08-07 ENCOUNTER — Telehealth: Payer: Self-pay | Admitting: Internal Medicine

## 2021-08-07 NOTE — Telephone Encounter (Signed)
Pt passed out at [employer] around 230 on yesterday and the ambulance was called and stated pt vitals were good ?

## 2021-08-08 NOTE — Telephone Encounter (Signed)
Yes. Would like earlier appt.  If any acute issues needs to be seen ?

## 2021-08-08 NOTE — Telephone Encounter (Signed)
S/w pt - sched for 5/4  ?

## 2021-08-08 NOTE — Telephone Encounter (Signed)
?  S/w pt - stated was at dinner with husband, and passed out. ?Pt stated she was out for about 2 minutes according to husband. ?Ambulance was called, EKG, BP, Pulse OX, and Finger Stick were done by EMS. ?Pt stated she was advised everything was normal, she was just very thirsty. ?Pt denied hospital visit. ?Went home, and laid down, and has been fine since. ?No dizziness, SOB, thirst, light headedness. ? ?Pt has follow up with Dr Posey Pronto (Neuro?) tomorrow. ? ?Pt next appt is in June, would you like to see her sooner? ?

## 2021-08-09 DIAGNOSIS — M316 Other giant cell arteritis: Secondary | ICD-10-CM | POA: Diagnosis not present

## 2021-08-09 DIAGNOSIS — Z7952 Long term (current) use of systemic steroids: Secondary | ICD-10-CM | POA: Diagnosis not present

## 2021-08-14 ENCOUNTER — Other Ambulatory Visit: Payer: Self-pay

## 2021-08-17 ENCOUNTER — Other Ambulatory Visit: Payer: Self-pay | Admitting: Internal Medicine

## 2021-08-17 ENCOUNTER — Encounter: Payer: Self-pay | Admitting: Internal Medicine

## 2021-08-17 ENCOUNTER — Ambulatory Visit (INDEPENDENT_AMBULATORY_CARE_PROVIDER_SITE_OTHER): Payer: No Typology Code available for payment source | Admitting: Internal Medicine

## 2021-08-17 VITALS — BP 130/78 | HR 70 | Temp 98.3°F | Resp 14 | Ht 64.0 in | Wt 138.0 lb

## 2021-08-17 DIAGNOSIS — I10 Essential (primary) hypertension: Secondary | ICD-10-CM | POA: Diagnosis not present

## 2021-08-17 DIAGNOSIS — R9089 Other abnormal findings on diagnostic imaging of central nervous system: Secondary | ICD-10-CM

## 2021-08-17 DIAGNOSIS — R55 Syncope and collapse: Secondary | ICD-10-CM

## 2021-08-17 DIAGNOSIS — E7800 Pure hypercholesterolemia, unspecified: Secondary | ICD-10-CM | POA: Diagnosis not present

## 2021-08-17 DIAGNOSIS — F439 Reaction to severe stress, unspecified: Secondary | ICD-10-CM | POA: Diagnosis not present

## 2021-08-17 DIAGNOSIS — D649 Anemia, unspecified: Secondary | ICD-10-CM | POA: Diagnosis not present

## 2021-08-17 DIAGNOSIS — M316 Other giant cell arteritis: Secondary | ICD-10-CM | POA: Diagnosis not present

## 2021-08-17 DIAGNOSIS — K209 Esophagitis, unspecified without bleeding: Secondary | ICD-10-CM

## 2021-08-17 DIAGNOSIS — R944 Abnormal results of kidney function studies: Secondary | ICD-10-CM | POA: Diagnosis not present

## 2021-08-17 DIAGNOSIS — R739 Hyperglycemia, unspecified: Secondary | ICD-10-CM | POA: Diagnosis not present

## 2021-08-17 DIAGNOSIS — E039 Hypothyroidism, unspecified: Secondary | ICD-10-CM | POA: Diagnosis not present

## 2021-08-17 LAB — CBC WITH DIFFERENTIAL/PLATELET
Basophils Absolute: 0.1 10*3/uL (ref 0.0–0.1)
Basophils Relative: 0.7 % (ref 0.0–3.0)
Eosinophils Absolute: 0.1 10*3/uL (ref 0.0–0.7)
Eosinophils Relative: 0.6 % (ref 0.0–5.0)
HCT: 35.7 % — ABNORMAL LOW (ref 36.0–46.0)
Hemoglobin: 11.9 g/dL — ABNORMAL LOW (ref 12.0–15.0)
Lymphocytes Relative: 27.1 % (ref 12.0–46.0)
Lymphs Abs: 2.7 10*3/uL (ref 0.7–4.0)
MCHC: 33.2 g/dL (ref 30.0–36.0)
MCV: 102.3 fl — ABNORMAL HIGH (ref 78.0–100.0)
Monocytes Absolute: 0.4 10*3/uL (ref 0.1–1.0)
Monocytes Relative: 4.5 % (ref 3.0–12.0)
Neutro Abs: 6.6 10*3/uL (ref 1.4–7.7)
Neutrophils Relative %: 67.1 % (ref 43.0–77.0)
Platelets: 236 10*3/uL (ref 150.0–400.0)
RBC: 3.49 Mil/uL — ABNORMAL LOW (ref 3.87–5.11)
RDW: 14.6 % (ref 11.5–15.5)
WBC: 9.8 10*3/uL (ref 4.0–10.5)

## 2021-08-17 LAB — LIPID PANEL
Cholesterol: 170 mg/dL (ref 0–200)
HDL: 84.8 mg/dL (ref >39.00)
LDL Cholesterol: 60 mg/dL (ref 0–99)
NonHDL: 84.99
Total CHOL/HDL Ratio: 2
Triglycerides: 124 mg/dL (ref 0.0–149.0)
VLDL: 24.8 mg/dL (ref 0.0–40.0)

## 2021-08-17 LAB — BASIC METABOLIC PANEL
BUN: 13 mg/dL (ref 6–23)
CO2: 29 mEq/L (ref 19–32)
Calcium: 9 mg/dL (ref 8.4–10.5)
Chloride: 106 mEq/L (ref 96–112)
Creatinine, Ser: 1.06 mg/dL (ref 0.40–1.20)
GFR: 51.88 mL/min — ABNORMAL LOW (ref >60.00)
Glucose, Bld: 88 mg/dL (ref 70–99)
Potassium: 3.7 mEq/L (ref 3.5–5.1)
Sodium: 141 mEq/L (ref 135–145)

## 2021-08-17 LAB — HEPATIC FUNCTION PANEL
ALT: 14 U/L (ref 0–35)
AST: 20 U/L (ref 0–37)
Albumin: 3.9 g/dL (ref 3.5–5.2)
Alkaline Phosphatase: 53 U/L (ref 39–117)
Bilirubin, Direct: 0.1 mg/dL (ref 0.0–0.3)
Total Bilirubin: 0.3 mg/dL (ref 0.2–1.2)
Total Protein: 6.2 g/dL (ref 6.0–8.3)

## 2021-08-17 LAB — HEMOGLOBIN A1C: Hgb A1c MFr Bld: 5.7 % (ref 4.6–6.5)

## 2021-08-17 NOTE — Progress Notes (Signed)
[name], female   DOB: [birth year], 76 y.o.   MRN: [id number] ? ? ?Subjective:  ? ? [name], female    DOB: October 17, 1945, 76 y.o.   MRN: [id number] ? ?Patient here for work in appt.  ? ?Chief Complaint  ?Patient presents with  ? Follow-up  ?  Follow up after syncope episode 1 wk ago  ? .  ? ?HPI ?Reports was eating and felt hot.  Husband was present.  She passed out. No seizure activity.  EMS called.  States EKG, blood sugar, blood pressure and pulse ox - ok.  Declined ER evaluation.  States since this episode, she has been ok.  No further syncope or near syncope.  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  No headaches. No blurred vision.  Just evaluated 08/09/21.  Decreasing prednisone by '1mg'$  q 2 weeks.  Frustrated with prednisone and side effects - gained weight/face full, etc.  Eating. No nausea or vomiting.  Bowels moving.  Occasionally will feel bloated in am.  Not persistent.  ? ? ?Past Medical History:  ?Diagnosis Date  ? GERD (gastroesophageal reflux disease)   ? Goiter   ? s/p thyroidectomy  ? H/O syncope   ? Hypercholesterolemia   ? Hypothyroidism   ? Recurrent sinus infections   ? and allergy problems  ? ?Past Surgical History:  ?Procedure Laterality Date  ? ARTERY BIOPSY Right 03/13/2021  ? Procedure: BIOPSY TEMPORAL ARTERY;  Surgeon: Robert Bellow, MD;  Location: [facility] ORS;  Service: General;  Laterality: Right;  ? BREAST BIOPSY    ? COLONOSCOPY WITH PROPOFOL N/A 11/03/2014  ? Procedure: COLONOSCOPY WITH PROPOFOL;  Surgeon: Manya Silvas, MD;  Location: [facility] ENDOSCOPY;  Service: Endoscopy;  Laterality: N/A;  ? COLONOSCOPY WITH PROPOFOL N/A 02/12/2018  ? Procedure: COLONOSCOPY WITH PROPOFOL;  Surgeon: Manya Silvas, MD;  Location: [facility] ENDOSCOPY;  Service: Endoscopy;  Laterality: N/A;  ? [place]  ? ESOPHAGOGASTRODUODENOSCOPY N/A 11/03/2014  ? Procedure: ESOPHAGOGASTRODUODENOSCOPY (EGD);  Surgeon: Manya Silvas, MD;  Location: [facility] ENDOSCOPY;  Service: Endoscopy;  Laterality: N/A;  ? LAPAROSCOPIC APPENDECTOMY N/A 11/26/2014  ? Procedure: APPENDECTOMY LAPAROSCOPIC;  Surgeon: Robert Bellow, MD;  Location: [facility] ORS;  Service: General;  Laterality: N/A;  ? thyroidectomy  1979  ? goiter  ? TUBAL LIGATION  1975  ? ?Family History  ?Problem Relation Age of Onset  ? Tuberculosis Paternal Grandfather   ? Alzheimer's disease Mother   ? Goiter Mother   ? Cancer Brother   ? Cancer Brother   ? Cancer Brother   ? Cancer Brother   ? ?Social History  ? ?Socioeconomic History  ? Marital status: Married  ?  Spouse name: Not on file  ? Number of children: Not on file  ? Years of education: Not on file  ? Highest education level: Not on file  ?Occupational History  ? Not on file  ?Tobacco Use  ? Smoking status: Former  ?  Types: Cigarettes  ?  Quit date: 04/16/2009  ?  Years since quitting: 12.3  ? Smokeless tobacco: Never  ?Substance and Sexual Activity  ? Alcohol use: Yes  ?  Alcohol/week: 3.0 - 4.0 standard drinks  ?  Types: 3 - 4 Standard drinks or equivalent per week  ?  Comment: occ  ? Drug use: Yes  ?  Types: Marijuana  ?  Comment: marijuana every day  ?  Sexual activity: Not on file  ?Other Topics Concern  ? Not on file  ?Social History Narrative  ? Not on file  ? ?Social Determinants of Health  ? ?Financial Resource Strain: Low Risk   ? Difficulty of Paying Living Expenses: Not hard at all  ?Food Insecurity: No Food Insecurity  ? Worried About Charity fundraiser in the Last Year: Never true  ? Ran Out of Food in the Last Year: Never true  ?Transportation Needs: No Transportation Needs  ? Lack of Transportation (Medical): No  ? Lack of Transportation (Non-Medical): No  ?Physical Activity: Insufficiently Active  ? Days of Exercise per Week: 3 days  ? Minutes of Exercise per Session: 30 min  ?Stress: No Stress Concern Present  ? Feeling of Stress : Not at all  ?Social Connections: Unknown  ? Frequency of Communication with  Friends and Family: More than three times a week  ? Frequency of Social Gatherings with Friends and Family: More than three times a week  ? Attends Religious Services: Not on file  ? Active Member of Clubs or Organizations: Not on file  ? Attends Archivist Meetings: Not on file  ? Marital Status: Not on file  ? ? ? ?Review of Systems  ?Constitutional:  Negative for fever.  ?     Reports weight gain with prednisone.    ?HENT:  Negative for congestion and sinus pressure.   ?Respiratory:  Negative for cough and chest tightness.   ?     Breathing stable.   ?Cardiovascular:  Negative for chest pain, palpitations and leg swelling.  ?Gastrointestinal:  Negative for abdominal pain, diarrhea, nausea and vomiting.  ?Genitourinary:  Negative for difficulty urinating and dysuria.  ?Musculoskeletal:  Negative for joint swelling and myalgias.  ?Skin:  Negative for color change and rash.  ?Neurological:  Negative for dizziness, light-headedness and headaches.  ?Psychiatric/Behavioral:  Negative for agitation and dysphoric mood.   ? ?   ?Objective:  ?  ? ?BP 130/78 (BP Location: Left Arm, Patient Position: Sitting, Cuff Size: Small)   Pulse 70   Temp 98.3 ?F (36.8 ?C) (Temporal)   Resp 14   Ht '5\' 4"'$  (1.626 m)   Wt 138 lb (62.6 kg)   SpO2 99%   BMI 23.69 kg/m?  ?Wt Readings from Last 3 Encounters:  ?08/17/21 138 lb (62.6 kg)  ?06/15/21 120 lb (54.4 kg)  ?06/07/21 121 lb (54.9 kg)  ? ? ?Physical Exam ?Vitals reviewed.  ?Constitutional:   ?   General: She is not in acute distress. ?   Appearance: Normal appearance.  ?HENT:  ?   Head: Normocephalic and atraumatic.  ?   Right Ear: External ear normal.  ?   Left Ear: External ear normal.  ?Eyes:  ?   General: No scleral icterus.    ?   Right eye: No discharge.     ?   Left eye: No discharge.  ?   Conjunctiva/sclera: Conjunctivae normal.  ?Neck:  ?   Thyroid: No thyromegaly.  ?Cardiovascular:  ?   Rate and Rhythm: Normal rate and regular rhythm.  ?Pulmonary:  ?   Effort:  No respiratory distress.  ?   Breath sounds: Normal breath sounds. No wheezing.  ?Abdominal:  ?   General: Bowel sounds are normal.  ?   Palpations: Abdomen is soft.  ?   Tenderness: There is no abdominal tenderness.  ?Musculoskeletal:     ?   General: No swelling or tenderness.  ?  Cervical back: Neck supple. No tenderness.  ?Lymphadenopathy:  ?   Cervical: No cervical adenopathy.  ?Skin: ?   Findings: No erythema or rash.  ?Neurological:  ?   Mental Status: She is alert.  ?Psychiatric:     ?   Mood and Affect: Mood normal.     ?   Behavior: Behavior normal.  ? ? ? ?Outpatient Encounter Medications as of 08/17/2021  ?Medication Sig  ? amLODipine (NORVASC) 5 MG tablet TAKE 1 TABLET BY MOUTH EVERY DAY  ? cyanocobalamin 1000 MCG tablet Take 1,000 mcg by mouth daily.  ? levothyroxine (SYNTHROID) 75 MCG tablet Take 1 tablet (75 mcg total) by mouth daily.  ? predniSONE (DELTASONE) 20 MG tablet TAKE 3 TABLETS BY MOUTH EVERY DAY  ? rosuvastatin (CRESTOR) 10 MG tablet TAKE 1 TABLET BY MOUTH EVERY DAY  ? [DISCONTINUED] lisinopril (ZESTRIL) 40 MG tablet TAKE 1 TABLET BY MOUTH EVERY DAY  ? ?No facility-administered encounter medications on file as of 08/17/2021.  ?  ? ?Lab Results  ?Component Value Date  ? WBC 9.8 08/17/2021  ? HGB 11.9 (L) 08/17/2021  ? HCT 35.7 (L) 08/17/2021  ? PLT 236.0 08/17/2021  ? GLUCOSE 88 08/17/2021  ? CHOL 170 08/17/2021  ? TRIG 124.0 08/17/2021  ? HDL 84.80 08/17/2021  ? [place] 60 08/17/2021  ? ALT 14 08/17/2021  ? AST 20 08/17/2021  ? NA 141 08/17/2021  ? K 3.7 08/17/2021  ? CL 106 08/17/2021  ? CREATININE 1.06 08/17/2021  ? BUN 13 08/17/2021  ? CO2 29 08/17/2021  ? TSH 10.211 (H) 06/07/2021  ? INR 1.2 06/07/2021  ? HGBA1C 5.7 08/17/2021  ? ? ?DG Chest Port 1 View ? ?Result Date: 06/07/2021 ?CLINICAL DATA:  Weakness and dizziness. EXAM: PORTABLE CHEST 1 VIEW COMPARISON:  September 25, 2011 FINDINGS: The heart size and mediastinal contours are within normal limits. Both lungs are clear. The visualized  skeletal structures are unremarkable. IMPRESSION: No active disease. Electronically Signed   By: Virgina Norfolk M.D.   On: 06/07/2021 01:02  ? ? ?   ?Assessment & Plan:  ? ?Problem List Items Addressed This Visi

## 2021-08-18 ENCOUNTER — Telehealth: Payer: Self-pay | Admitting: Internal Medicine

## 2021-08-18 ENCOUNTER — Other Ambulatory Visit: Payer: Self-pay

## 2021-08-18 MED ORDER — LISINOPRIL 40 MG PO TABS: 40.0000 mg | ORAL_TABLET | Freq: Every day | ORAL | 1 refills | Status: DC

## 2021-08-18 NOTE — Telephone Encounter (Signed)
sent 

## 2021-08-18 NOTE — Telephone Encounter (Signed)
Melissa from [facility] called requesting a refill for pt lisinopril sent to [facility] ?

## 2021-08-27 ENCOUNTER — Encounter: Payer: Self-pay | Admitting: Internal Medicine

## 2021-08-27 DIAGNOSIS — R9089 Other abnormal findings on diagnostic imaging of central nervous system: Secondary | ICD-10-CM | POA: Insufficient documentation

## 2021-08-27 NOTE — Assessment & Plan Note (Signed)
Recheck cbc.  

## 2021-08-27 NOTE — Assessment & Plan Note (Signed)
Blood pressure has been doing well.  She continues on lisinopril and amlodipine. Follow blood pressures.  Follow metabolic panel.  

## 2021-08-27 NOTE — Assessment & Plan Note (Signed)
Continue crestor.  Follow lipid panel and liver function tests.   ?Lab Results  ?Component Value Date  ? CHOL 170 08/17/2021  ? HDL 84.80 08/17/2021  ? [place] 60 08/17/2021  ? TRIG 124.0 08/17/2021  ? CHOLHDL 2 08/17/2021  ? ?

## 2021-08-27 NOTE — Assessment & Plan Note (Signed)
With recent headache, MRA brain performed during w/up.  MRA - Mildly motion degraded exam. No intracranial large vessel occlusion or proximal high-grade arterial stenosis.  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 Both posterior cerebral arteries demonstrate distal branch ?atherosclerotic irregularity. 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 Apparent 1-2 mm broad-based vascular protrusion arising from the supraclinoid right ICA, which may reflect a small aneurysm or ?artifact related to motion.  Had referred her to neurology.  It appears they tried to schedule an appt.  No headache currently.  Pursue cardiology w/up as outlined.  Discuss need for f/u with neurology.   ?

## 2021-08-27 NOTE — Assessment & Plan Note (Signed)
Low carb diet and exercise.  On prednisone. Tapering off.  Follow met b and a1c.  ?

## 2021-08-27 NOTE — Assessment & Plan Note (Addendum)
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  No headaches. No blurred vision.  Just evaluated 08/09/21.  Decreasing prednisone by '1mg'$  q 2 weeks. On '4mg'$  q day now.  ?

## 2021-08-27 NOTE — Assessment & Plan Note (Signed)
Reports was eating and felt hot.  Husband was present.  She passed out. No seizure activity.  EMS called.  States EKG, blood sugar, blood pressure and pulse ox - ok.  Declined ER evaluation.  States since this episode, she has been ok.  No further syncope or near syncope since this episode.  She did have an evaluation prior (in ER) for near syncopal episode.  Was felt to be related to dehydration.  Given recent episode, in office EKG performed and revealed SR with minimal TWI III /non specific ST/T changes.  No acute changes.  Discussed further cardiac evaluation.  Will refer to cardiology for further evaluation and w/up.  ?

## 2021-08-27 NOTE — Assessment & Plan Note (Signed)
Have discussed importance of staying hydrated.  Avoid antiinflammatories.  Continue lisinopril.  ?

## 2021-08-27 NOTE — Assessment & Plan Note (Signed)
Increased stress with current medical issues, prednisone, etc.  Discussed.  Does not feel needs further intervention at this time.   ?

## 2021-08-27 NOTE — Assessment & Plan Note (Addendum)
Reports no upper GI symptoms.  Follow - on prednisone.  

## 2021-08-27 NOTE — Assessment & Plan Note (Signed)
On synthroid.  Dose recently adjusted.  Follow tsh.  ?

## 2021-09-14 ENCOUNTER — Telehealth: Payer: Self-pay | Admitting: Internal Medicine

## 2021-09-14 NOTE — Telephone Encounter (Signed)
Pt need refill on [place] sent to [facility]

## 2021-09-15 ENCOUNTER — Other Ambulatory Visit: Payer: Self-pay

## 2021-09-15 NOTE — Telephone Encounter (Signed)
Pt on ferrous sulfate otc - husband advised

## 2021-09-16 ENCOUNTER — Other Ambulatory Visit: Payer: Self-pay | Admitting: Internal Medicine

## 2021-09-27 ENCOUNTER — Ambulatory Visit (INDEPENDENT_AMBULATORY_CARE_PROVIDER_SITE_OTHER): Payer: No Typology Code available for payment source | Admitting: Internal Medicine

## 2021-09-27 ENCOUNTER — Encounter: Payer: Self-pay | Admitting: Internal Medicine

## 2021-09-27 ENCOUNTER — Telehealth: Payer: Self-pay | Admitting: Internal Medicine

## 2021-09-27 DIAGNOSIS — E7800 Pure hypercholesterolemia, unspecified: Secondary | ICD-10-CM

## 2021-09-27 DIAGNOSIS — R9089 Other abnormal findings on diagnostic imaging of central nervous system: Secondary | ICD-10-CM | POA: Diagnosis not present

## 2021-09-27 DIAGNOSIS — I10 Essential (primary) hypertension: Secondary | ICD-10-CM

## 2021-09-27 DIAGNOSIS — E039 Hypothyroidism, unspecified: Secondary | ICD-10-CM

## 2021-09-27 DIAGNOSIS — R55 Syncope and collapse: Secondary | ICD-10-CM

## 2021-09-27 DIAGNOSIS — D649 Anemia, unspecified: Secondary | ICD-10-CM | POA: Diagnosis not present

## 2021-09-27 DIAGNOSIS — Z86010 Personal history of colonic polyps: Secondary | ICD-10-CM | POA: Diagnosis not present

## 2021-09-27 DIAGNOSIS — R944 Abnormal results of kidney function studies: Secondary | ICD-10-CM | POA: Diagnosis not present

## 2021-09-27 DIAGNOSIS — F439 Reaction to severe stress, unspecified: Secondary | ICD-10-CM | POA: Diagnosis not present

## 2021-09-27 DIAGNOSIS — M316 Other giant cell arteritis: Secondary | ICD-10-CM

## 2021-09-27 DIAGNOSIS — R739 Hyperglycemia, unspecified: Secondary | ICD-10-CM

## 2021-09-27 NOTE — Progress Notes (Signed)
[name], female   DOB: 24-May-1945, 76 y.o.   MRN: [id number]   Subjective:    [name], female    DOB: 03-Dec-1945, 76 y.o.   MRN: [id number]   Patient here for a scheduled follow up.     HPI Here to follow up regarding her blood pressure, TA and increased stress.  Only on prednisone $RemoveBefor'1mg'WvxJiKdQcMir$  now.  Tapering off.  Feeling better.  Not as bloated.  Getting more back to her "normal self".  Stays active.  No chest pain or sob reported. No acid reflux or abdominal pain reported.  No cough or congestion.  Has f/u with Dr Posey Pronto 11/08/21.     Past Medical History:  Diagnosis Date   GERD (gastroesophageal reflux disease)    Goiter    s/p thyroidectomy   H/O syncope    Hypercholesterolemia    Hypothyroidism    Recurrent sinus infections    and allergy problems   Past Surgical History:  Procedure Laterality Date   ARTERY BIOPSY Right 03/13/2021   Procedure: BIOPSY TEMPORAL ARTERY;  Surgeon: Robert Bellow, MD;  Location: [facility] ORS;  Service: General;  Laterality: Right;   BREAST BIOPSY     COLONOSCOPY WITH PROPOFOL N/A 11/03/2014   Procedure: COLONOSCOPY WITH PROPOFOL;  Surgeon: Manya Silvas, MD;  Location: [place];  Service: Endoscopy;  Laterality: N/A;   COLONOSCOPY WITH PROPOFOL N/A 02/12/2018   Procedure: COLONOSCOPY WITH PROPOFOL;  Surgeon: Manya Silvas, MD;  Location: [facility] ENDOSCOPY;  Service: Endoscopy;  Laterality: N/A;   [place]   ESOPHAGOGASTRODUODENOSCOPY N/A 11/03/2014   Procedure: ESOPHAGOGASTRODUODENOSCOPY (EGD);  Surgeon: Manya Silvas, MD;  Location: [facility] ENDOSCOPY;  Service: Endoscopy;  Laterality: N/A;   LAPAROSCOPIC APPENDECTOMY N/A 11/26/2014   Procedure: APPENDECTOMY LAPAROSCOPIC;  Surgeon: Robert Bellow, MD;  Location: [facility] ORS;  Service: General;  Laterality: N/A;   thyroidectomy  1979   goiter   TUBAL LIGATION  1975   Family History  Problem Relation Age of Onset    Tuberculosis Paternal Grandfather    Alzheimer's disease Mother    Goiter Mother    Cancer Brother    Cancer Brother    Cancer Brother    Cancer Brother    Social History   Socioeconomic History   Marital status: Married    Spouse name: Not on file   Number of children: Not on file   Years of education: Not on file   Highest education level: Not on file  Occupational History   Not on file  Tobacco Use   Smoking status: Former    Types: Cigarettes    Quit date: 04/16/2009    Years since quitting: 12.4   Smokeless tobacco: Never  Substance and Sexual Activity   Alcohol use: Yes    Alcohol/week: 3.0 - 4.0 standard drinks of alcohol    Types: 3 - 4 Standard drinks or equivalent per week    Comment: occ   Drug use: Yes    Types: Marijuana    Comment: marijuana every day   Sexual activity: Not on file  Other Topics Concern   Not on file  Social History Narrative   Not on file   Social Determinants of Health   Financial Resource Strain: Low Risk  (05/23/2021)   Overall Financial Resource Strain (CARDIA)    Difficulty of Paying Living Expenses: Not hard at all  Food Insecurity: No Food Insecurity (05/23/2021)  Hunger Vital Sign    Worried About Running Out of Food in the Last Year: Never true    Ran Out of Food in the Last Year: Never true  Transportation Needs: No Transportation Needs (05/23/2021)   PRAPARE - Hydrologist (Medical): No    Lack of Transportation (Non-Medical): No  Physical Activity: Insufficiently Active (05/23/2021)   Exercise Vital Sign    Days of Exercise per Week: 3 days    Minutes of Exercise per Session: 30 min  Stress: No Stress Concern Present (05/23/2021)   [place]    Feeling of Stress : Not at all  Social Connections: Unknown (05/23/2021)   Social Connection and Isolation Panel [NHANES]    Frequency of Communication with Friends and Family: More than three  times a week    Frequency of Social Gatherings with Friends and Family: More than three times a week    Attends Religious Services: Not on Advertising copywriter or Organizations: Not on file    Attends Archivist Meetings: Not on file    Marital Status: Not on file     Review of Systems  Constitutional:  Negative for appetite change and unexpected weight change.  HENT:  Negative for congestion and sinus pressure.   Respiratory:  Negative for cough, chest tightness and shortness of breath.   Cardiovascular:  Negative for chest pain, palpitations and leg swelling.  Gastrointestinal:  Negative for abdominal pain, diarrhea, nausea and vomiting.  Genitourinary:  Negative for difficulty urinating and dysuria.  Musculoskeletal:  Negative for joint swelling and myalgias.  Skin:  Negative for color change and rash.  Neurological:  Negative for dizziness, light-headedness and headaches.  Psychiatric/Behavioral:  Negative for agitation and dysphoric mood.        Objective:     BP 124/64 (BP Location: Left Arm, Patient Position: Sitting, Cuff Size: Normal)   Pulse 73   Temp 98.5 F (36.9 C) (Oral)   Ht $R'5\' 4"'SS$  (1.626 m)   Wt 134 lb 3.2 oz (60.9 kg)   SpO2 98%   BMI 23.04 kg/m  Wt Readings from Last 3 Encounters:  09/27/21 134 lb 3.2 oz (60.9 kg)  08/17/21 138 lb (62.6 kg)  06/15/21 120 lb (54.4 kg)    Physical Exam Vitals reviewed.  Constitutional:      General: She is not in acute distress.    Appearance: Normal appearance.  HENT:     Head: Normocephalic and atraumatic.     Right Ear: External ear normal.     Left Ear: External ear normal.  Eyes:     General: No scleral icterus.       Right eye: No discharge.        Left eye: No discharge.     Conjunctiva/sclera: Conjunctivae normal.  Neck:     Thyroid: No thyromegaly.  Cardiovascular:     Rate and Rhythm: Normal rate and regular rhythm.  Pulmonary:     Effort: No respiratory distress.     Breath  sounds: Normal breath sounds. No wheezing.  Abdominal:     General: Bowel sounds are normal.     Palpations: Abdomen is soft.     Tenderness: There is no abdominal tenderness.  Musculoskeletal:        General: No swelling or tenderness.     Cervical back: Neck supple. No tenderness.  Lymphadenopathy:     Cervical: No  cervical adenopathy.  Skin:    Findings: No erythema or rash.  Neurological:     Mental Status: She is alert.  Psychiatric:        Mood and Affect: Mood normal.        Behavior: Behavior normal.      Outpatient Encounter Medications as of 09/27/2021  Medication Sig   amLODipine (NORVASC) 2.5 MG tablet Take 2.5 mg by mouth daily.   cyanocobalamin 1000 MCG tablet Take 1,000 mcg by mouth daily.   ferrous sulfate 325 (65 FE) MG tablet Take 325 mg by mouth daily with breakfast.   folic acid (FOLVITE) 1 MG tablet Take 1 mg by mouth daily.   levothyroxine (SYNTHROID) 75 MCG tablet TAKE 1 TABLET BY MOUTH EVERY DAY   lisinopril (ZESTRIL) 40 MG tablet Take 1 tablet (40 mg total) by mouth daily.   predniSONE (DELTASONE) 1 MG tablet Take 1 mg by mouth daily with breakfast. Take 1 mg by mouth daily for the next 7 days.   rosuvastatin (CRESTOR) 10 MG tablet TAKE 1 TABLET BY MOUTH EVERY DAY   [DISCONTINUED] predniSONE (DELTASONE) 20 MG tablet TAKE 3 TABLETS BY MOUTH EVERY DAY   [DISCONTINUED] amLODipine (NORVASC) 5 MG tablet TAKE 1 TABLET BY MOUTH EVERY DAY   No facility-administered encounter medications on file as of 09/27/2021.     Lab Results  Component Value Date   WBC 9.8 08/17/2021   HGB 11.9 (L) 08/17/2021   HCT 35.7 (L) 08/17/2021   PLT 236.0 08/17/2021   GLUCOSE 88 08/17/2021   CHOL 170 08/17/2021   TRIG 124.0 08/17/2021   HDL 84.80 08/17/2021   LDLCALC 60 08/17/2021   ALT 14 08/17/2021   AST 20 08/17/2021   NA 141 08/17/2021   K 3.7 08/17/2021   CL 106 08/17/2021   CREATININE 1.06 08/17/2021   BUN 13 08/17/2021   CO2 29 08/17/2021   TSH 10.211 (H)  06/07/2021   INR 1.2 06/07/2021   HGBA1C 5.7 08/17/2021    DG Chest Port 1 View  Result Date: 06/07/2021 CLINICAL DATA:  Weakness and dizziness. EXAM: PORTABLE CHEST 1 VIEW COMPARISON:  September 25, 2011 FINDINGS: The heart size and mediastinal contours are within normal limits. Both lungs are clear. The visualized skeletal structures are unremarkable. IMPRESSION: No active disease. Electronically Signed   By: Virgina Norfolk M.D.   On: 06/07/2021 01:02       Assessment & Plan:   Problem List Items Addressed This Visit     Abnormal MRA, brain    With recent headache, MRA brain performed during w/up.  MRA - Mildly motion degraded exam. No intracranial large vessel occlusion or proximal high-grade arterial stenosis.  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 Both posterior cerebral arteries demonstrate distal branch atherosclerotic irregularity. 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 Apparent 1-2 mm broad-based vascular protrusion arising from the supraclinoid right ICA, which may reflect a small aneurysm or artifact related to motion.  Had referred her to neurology.  It appears they tried to schedule an appt.  No headache currently.  Pursue cardiology w/up as outlined in previous note. Discussed with her today regarding f/u appts.  She prefers to see Dr Posey Pronto first.  States she wants to get this taken care of first and then will follow up regarding other appts/specialists.        Anemia    Follow cbc.       Relevant Medications   folic  acid (FOLVITE) 1 MG tablet   ferrous sulfate 325 (65 FE) MG tablet   Decreased GFR    Have discussed importance of staying hydrated.  Avoid antiinflammatories.  Continue lisinopril.       Essential hypertension, benign    Blood pressure has been doing well.  She continues on lisinopril and amlodipine. Follow blood pressures.  Follow metabolic panel.       Relevant Medications   amLODipine  (NORVASC) 2.5 MG tablet   History of colonic polyps    Colonoscopy 09/2020 - recommended no f/u colonoscopy.        Hypercholesterolemia    Continue crestor.  Follow lipid panel and liver function tests.   Lab Results  Component Value Date   CHOL 170 08/17/2021   HDL 84.80 08/17/2021   LDLCALC 60 08/17/2021   TRIG 124.0 08/17/2021   CHOLHDL 2 08/17/2021       Relevant Medications   amLODipine (NORVASC) 2.5 MG tablet   Hyperglycemia    Low carb diet and exercise.  On prednisone. Tapering off.  Follow met b and a1c.       Hypothyroidism    On synthroid.  Dose recently adjusted.  Follow tsh.       Stress    Increased stress with current medical issues, prednisone, etc.  Discussed.  Does not feel needs further intervention at this time.  Overall she feels she is doing better.  Follow.       Syncope    See last note.  Syncopal episode recently.  No seizure activity.  EMS called.  States EKG, blood sugar, blood pressure and pulse ox - ok.  Declined ER evaluation.  States since this episode, she has been ok.  No further syncope or near syncope since this episode.  She did have an evaluation prior (in ER) for near syncopal episode.  Was felt to be related to dehydration.  Given recent episode, in office EKG performed last visit and revealed SR with minimal TWI III /non specific ST/T changes.  No acute changes.  Discussed further cardiac evaluation.  Will refer to cardiology for further evaluation and w/up. Referral placed.  She desires to hold at this time as outlined.  Will let us know when ready to schedule.       Temporal arteritis ([place])    She is being followed by rheumatology for giant cell arteritis.  No headaches. No blurred vision.  Just evaluated 08/09/21.  Decreasing prednisone by $RemoveBefor'1mg'oNNVcDJtzKdk$  q 2 weeks. On $RemoveB'1mg'QgqKArgQ$  q day now.  Tapering off.  Follow.       Relevant Medications   amLODipine (NORVASC) 2.5 MG tablet     Einar Pheasant, MD

## 2021-09-27 NOTE — Telephone Encounter (Signed)
Pt called stating provider wanted her to call back with some clarity on her medication-Amlodipine instead of '5mg'$  she is taking 2.'5mg'$  for 21/2 years and cyanocobalamin medication she is taking 1000 instead of 2000

## 2021-10-02 ENCOUNTER — Encounter: Payer: Self-pay | Admitting: Internal Medicine

## 2021-10-02 NOTE — Assessment & Plan Note (Signed)
Blood pressure has been doing well.  She continues on lisinopril and amlodipine. Follow blood pressures.  Follow metabolic panel.  

## 2021-10-02 NOTE — Assessment & Plan Note (Signed)
See last note.  Syncopal episode recently.  No seizure activity.  EMS called.  States EKG, blood sugar, blood pressure and pulse ox - ok.  Declined ER evaluation.  States since this episode, she has been ok.  No further syncope or near syncope since this episode.  She did have an evaluation prior (in ER) for near syncopal episode.  Was felt to be related to dehydration.  Given recent episode, in office EKG performed last visit and revealed SR with minimal TWI III /non specific ST/T changes.  No acute changes.  Discussed further cardiac evaluation.  Will refer to cardiology for further evaluation and w/up. Referral placed.  She desires to hold at this time as outlined.  Will let us know when ready to schedule.

## 2021-10-02 NOTE — Assessment & Plan Note (Signed)
Colonoscopy 09/2020 - recommended no f/u colonoscopy.

## 2021-10-02 NOTE — Assessment & Plan Note (Signed)
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  No headaches. No blurred vision.  Just evaluated 08/09/21.  Decreasing prednisone by '1mg'$  q 2 weeks. On '1mg'$  q day now.  Tapering off.  Follow.

## 2021-10-02 NOTE — Assessment & Plan Note (Signed)
On synthroid.  Dose recently adjusted.  Follow tsh.

## 2021-10-02 NOTE — Assessment & Plan Note (Signed)
Increased stress with current medical issues, prednisone, etc.  Discussed.  Does not feel needs further intervention at this time.  Overall she feels she is doing better.  Follow.  

## 2021-10-02 NOTE — Assessment & Plan Note (Signed)
Low carb diet and exercise.  On prednisone. Tapering off.  Follow met b and a1c.  ?

## 2021-10-02 NOTE — Assessment & Plan Note (Addendum)
With recent headache, MRA brain performed during w/up.  MRA - Mildly motion degraded exam. No intracranial large vessel occlusion or proximal high-grade arterial stenosis.  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 Both posterior cerebral arteries demonstrate distal branch atherosclerotic irregularity. 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 Apparent 1-2 mm broad-based vascular protrusion arising from the supraclinoid right ICA, which may reflect a small aneurysm or artifact related to motion.  Had referred her to neurology.  It appears they tried to schedule an appt.  No headache currently.  Pursue cardiology w/up as outlined in previous note. Discussed with her today regarding f/u appts.  She prefers to see Dr Posey Pronto first.  States she wants to get this taken care of first and then will follow up regarding other appts/specialists.

## 2021-10-02 NOTE — Assessment & Plan Note (Signed)
Have discussed importance of staying hydrated.  Avoid antiinflammatories.  Continue lisinopril.

## 2021-10-02 NOTE — Assessment & Plan Note (Signed)
Continue crestor.  Follow lipid panel and liver function tests.   Lab Results  Component Value Date   CHOL 170 08/17/2021   HDL 84.80 08/17/2021   LDLCALC 60 08/17/2021   TRIG 124.0 08/17/2021   CHOLHDL 2 08/17/2021

## 2021-10-02 NOTE — Assessment & Plan Note (Signed)
Follow cbc.  

## 2021-11-09 DIAGNOSIS — Z7952 Long term (current) use of systemic steroids: Secondary | ICD-10-CM | POA: Diagnosis not present

## 2021-11-09 DIAGNOSIS — M316 Other giant cell arteritis: Secondary | ICD-10-CM | POA: Diagnosis not present

## 2021-11-20 ENCOUNTER — Other Ambulatory Visit: Payer: Self-pay | Admitting: Internal Medicine

## 2021-11-27 ENCOUNTER — Ambulatory Visit (INDEPENDENT_AMBULATORY_CARE_PROVIDER_SITE_OTHER): Payer: No Typology Code available for payment source | Admitting: Internal Medicine

## 2021-11-27 ENCOUNTER — Encounter: Payer: Self-pay | Admitting: Internal Medicine

## 2021-11-27 VITALS — BP 120/62 | HR 60 | Temp 97.9°F | Resp 15 | Ht 64.0 in | Wt 127.0 lb

## 2021-11-27 DIAGNOSIS — E039 Hypothyroidism, unspecified: Secondary | ICD-10-CM | POA: Diagnosis not present

## 2021-11-27 DIAGNOSIS — D649 Anemia, unspecified: Secondary | ICD-10-CM | POA: Diagnosis not present

## 2021-11-27 DIAGNOSIS — F439 Reaction to severe stress, unspecified: Secondary | ICD-10-CM | POA: Diagnosis not present

## 2021-11-27 DIAGNOSIS — I10 Essential (primary) hypertension: Secondary | ICD-10-CM

## 2021-11-27 DIAGNOSIS — R519 Headache, unspecified: Secondary | ICD-10-CM

## 2021-11-27 DIAGNOSIS — E7800 Pure hypercholesterolemia, unspecified: Secondary | ICD-10-CM | POA: Diagnosis not present

## 2021-11-27 DIAGNOSIS — M316 Other giant cell arteritis: Secondary | ICD-10-CM | POA: Diagnosis not present

## 2021-11-27 DIAGNOSIS — R9089 Other abnormal findings on diagnostic imaging of central nervous system: Secondary | ICD-10-CM

## 2021-11-27 DIAGNOSIS — Z0000 Encounter for general adult medical examination without abnormal findings: Secondary | ICD-10-CM | POA: Diagnosis not present

## 2021-11-27 DIAGNOSIS — R739 Hyperglycemia, unspecified: Secondary | ICD-10-CM

## 2021-11-27 DIAGNOSIS — K209 Esophagitis, unspecified without bleeding: Secondary | ICD-10-CM | POA: Diagnosis not present

## 2021-11-27 NOTE — Progress Notes (Signed)
[name], female   DOB: [birth year], 76 y.o.   MRN: [id number]   Subjective:    [name], female    DOB: 1945/10/28, 76 y.o.   MRN: [id number]   Patient here for physical exam. .   HPI Reports she feels good.  Feels more like herself.  Had been off prednisone since 09/2021.  Saw Dr Posey Pronto 11/09/21 - f/u labs ESR 79 and elevated CRP.  Started back on prednisone 34m q day.  No headache.  No dizziness or vision change.  No chest pain or sob.  No acid reflux.  No abdominal pain.  Bowels stable.     Past Medical History:  Diagnosis Date   GERD (gastroesophageal reflux disease)    Goiter    s/p thyroidectomy   H/O syncope    Hypercholesterolemia    Hypothyroidism    Recurrent sinus infections    and allergy problems   Past Surgical History:  Procedure Laterality Date   ARTERY BIOPSY Right 03/13/2021   Procedure: BIOPSY TEMPORAL ARTERY;  Surgeon: BRobert Bellow MD;  Location: [facility];  Service: General;  Laterality: Right;   BREAST BIOPSY     COLONOSCOPY WITH PROPOFOL N/A 11/03/2014   Procedure: COLONOSCOPY WITH PROPOFOL;  Surgeon: RManya Silvas MD;  [place] [facility]  Service: Endoscopy;  Laterality: N/A;   COLONOSCOPY WITH PROPOFOL N/A 02/12/2018   Procedure: COLONOSCOPY WITH PROPOFOL;  Surgeon: EManya Silvas MD;  [facility]  Service: Endoscopy;  Laterality: N/A;   [place]  ESOPHAGOGASTRODUODENOSCOPY N/A 11/03/2014   Procedure: ESOPHAGOGASTRODUODENOSCOPY (EGD);  Surgeon: RManya Silvas MD;  [facility];  Service: Endoscopy;  Laterality: N/A;   LAPAROSCOPIC APPENDECTOMY N/A 11/26/2014   Procedure: APPENDECTOMY LAPAROSCOPIC;  Surgeon: JRobert Bellow MD;  Location: [facility];  Service: General;  Laterality: N/A;   thyroidectomy  1979   goiter   TUBAL LIGATION  1975   Family History  Problem Relation Age of Onset   Tuberculosis Paternal Grandfather     Alzheimer's disease Mother    Goiter Mother    Cancer Brother    Cancer Brother    Cancer Brother    Cancer Brother    Social History   Socioeconomic History   Marital status: Married    Spouse name: Not on file   Number of children: Not on file   Years of education: Not on file   Highest education level: Not on file  Occupational History   Not on file  Tobacco Use   Smoking status: Former    Types: Cigarettes    Quit date: 04/16/2009    Years since quitting: 12.6   Smokeless tobacco: Never  Substance and Sexual Activity   Alcohol use: Yes    Alcohol/week: 3.0 - 4.0 standard drinks of alcohol    Types: 3 - 4 Standard drinks or equivalent per week    Comment: occ   Drug use: Yes    Types: Marijuana    Comment: marijuana every day   Sexual activity: Not on file  Other Topics Concern   Not on file  Social History Narrative   Not on file   Social Determinants of Health   Financial Resource Strain: Low Risk  (05/23/2021)   Overall Financial Resource Strain (CARDIA)    Difficulty of Paying Living Expenses: Not hard at all  Food Insecurity: No Food Insecurity (05/23/2021)   Hunger Vital Sign  Worried About Charity fundraiser in the Last Year: Never true    [place] in the Last Year: Never true  Transportation Needs: No Transportation Needs (05/23/2021)   PRAPARE - Hydrologist (Medical): No    Lack of Transportation (Non-Medical): No  Physical Activity: Insufficiently Active (05/23/2021)   Exercise Vital Sign    Days of Exercise per Week: 3 days    Minutes of Exercise per Session: 30 min  Stress: No Stress Concern Present (05/23/2021)   [place]    Feeling of Stress : Not at all  Social Connections: Unknown (05/23/2021)   Social Connection and Isolation Panel [NHANES]    Frequency of Communication with Friends and Family: More than three times a week    Frequency of Social  Gatherings with Friends and Family: More than three times a week    Attends Religious Services: Not on Advertising copywriter or Organizations: Not on file    Attends Archivist Meetings: Not on file    Marital Status: Not on file     Review of Systems  Constitutional:  Negative for appetite change and unexpected weight change.  HENT:  Negative for congestion, sinus pressure and sore throat.   Eyes:  Negative for pain and visual disturbance.  Respiratory:  Negative for cough, chest tightness and shortness of breath.   Cardiovascular:  Negative for chest pain, palpitations and leg swelling.  Gastrointestinal:  Negative for abdominal pain, diarrhea, nausea and vomiting.  Genitourinary:  Negative for difficulty urinating and dysuria.  Musculoskeletal:  Negative for joint swelling and myalgias.  Skin:  Negative for color change and rash.  Neurological:  Negative for dizziness, light-headedness and headaches.  Hematological:  Negative for adenopathy. Does not bruise/bleed easily.  Psychiatric/Behavioral:  Negative for agitation and dysphoric mood.        Objective:     BP 120/62 (BP Location: Left Arm, Patient Position: Sitting, Cuff Size: Small)   Pulse 60   Temp 97.9 F (36.6 C) (Temporal)   Resp 15   Ht _0  (1.626 m)   Wt 127 lb (57.6 kg)   SpO2 99%   BMI 21.80 kg/m  Wt Readings from Last 3 Encounters:  11/27/21 127 lb (57.6 kg)  09/27/21 134 lb 3.2 oz (60.9 kg)  08/17/21 138 lb (62.6 kg)    Physical Exam Vitals reviewed.  Constitutional:      General: She is not in acute distress.    Appearance: Normal appearance. She is well-developed.  HENT:     Head: Normocephalic and atraumatic.     Right Ear: External ear normal.     Left Ear: External ear normal.  Eyes:     General: No scleral icterus.       Right eye: No discharge.        Left eye: No discharge.     Conjunctiva/sclera: Conjunctivae normal.  Neck:     Thyroid: No thyromegaly.   Cardiovascular:     Rate and Rhythm: Normal rate and regular rhythm.  Pulmonary:     Effort: No tachypnea, accessory muscle usage or respiratory distress.     Breath sounds: Normal breath sounds. No decreased breath sounds or wheezing.  Chest:  Breasts:    Right: No inverted nipple, mass, nipple discharge or tenderness (no axillary adenopathy).     Left: No inverted nipple, mass, nipple discharge or tenderness (  no axilarry adenopathy).  Abdominal:     General: Bowel sounds are normal.     Palpations: Abdomen is soft.     Tenderness: There is no abdominal tenderness.  Musculoskeletal:        General: No swelling or tenderness.     Cervical back: Neck supple.  Lymphadenopathy:     Cervical: No cervical adenopathy.  Skin:    Findings: No erythema or rash.  Neurological:     Mental Status: She is alert and oriented to person, place, and time.  Psychiatric:        Mood and Affect: Mood normal.        Behavior: Behavior normal.      Outpatient Encounter Medications as of 11/27/2021  Medication Sig   amLODipine (NORVASC) 2.5 MG tablet Take 2.5 mg by mouth daily.   cyanocobalamin 1000 MCG tablet Take 1,000 mcg by mouth daily.   ferrous sulfate 325 (65 FE) MG tablet Take 325 mg by mouth daily with breakfast.   folic acid (FOLVITE) 1 MG tablet Take 1 mg by mouth daily.   levothyroxine (SYNTHROID) 75 MCG tablet TAKE 1 TABLET BY MOUTH EVERY DAY   lisinopril (ZESTRIL) 40 MG tablet Take 1 tablet (40 mg total) by mouth daily.   predniSONE (DELTASONE) 1 MG tablet Take 1 mg by mouth daily with breakfast. Take 1 mg by mouth daily for the next 7 days.   rosuvastatin (CRESTOR) 10 MG tablet TAKE 1 TABLET BY MOUTH EVERY DAY   No facility-administered encounter medications on file as of 11/27/2021.     Lab Results  Component Value Date   WBC 7.7 11/29/2021   HGB 9.7 (A) 11/29/2021   HCT 30 (A) 11/29/2021   PLT 252 11/29/2021   GLUCOSE 88 08/17/2021   CHOL 186 11/29/2021   TRIG 61  11/29/2021   HDL 104 (A) 11/29/2021   LDLCALC 70 11/29/2021   ALT 17 11/29/2021   AST 17 11/29/2021   NA 140 11/29/2021   K 5.0 11/29/2021   CL 106 11/29/2021   CREATININE 1.1 11/29/2021   BUN 26 (A) 11/29/2021   CO2 28 (A) 11/29/2021   TSH 0.22 (A) 11/29/2021   INR 1.2 06/07/2021   HGBA1C 6.6 11/29/2021    DG Chest Port 1 View  Result Date: 06/07/2021 CLINICAL DATA:  Weakness and dizziness. EXAM: PORTABLE CHEST 1 VIEW COMPARISON:  September 25, 2011 FINDINGS: The heart size and mediastinal contours are within normal limits. Both lungs are clear. The visualized skeletal structures are unremarkable. IMPRESSION: No active disease. Electronically Signed   By: Virgina Norfolk M.D.   On: 06/07/2021 01:02       Assessment & Plan:   Problem List Items Addressed This Visit     Abnormal MRA, brain    With recent headache, MRA brain performed during w/up.  MRA - Mildly motion degraded exam. No intracranial large vessel occlusion or proximal high-grade arterial stenosis.  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 Both posterior cerebral arteries demonstrate distal branch atherosclerotic irregularity. 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 Apparent 1-2 mm broad-based vascular protrusion arising from the supraclinoid right ICA, which may reflect a small aneurysm or artifact related to motion.  Had referred her to neurology.  It appears they tried to schedule an appt.  No headache currently.  Feels good.  She prefers to see Dr Posey Pronto first.  States she wants to get this taken care of first and then will  follow up regarding other appts/specialists.        Anemia    Recheck cbc and iron studies with next labs.       Relevant Orders   CBC w/Diff   Esophagitis    Reports no upper GI symptoms.  Follow - on prednisone.       Essential hypertension, benign - Primary    Blood pressure has been doing well.  She continues on lisinopril and  amlodipine. Follow blood pressures.  Follow metabolic panel.       Headache    No headache.  No visual disturbance.  Follow.       Health care maintenance    Physical today 11/27/21.  Mammogram 01/2020.  Overdue mammogram.  Discussed.  Declines. C olonoscopy 09/27/20 - pathology - rectal polyp - hyperplastic polyp.  Recommended no f/u colonoscopy.       Hypercholesterolemia    Continue crestor.  Follow lipid panel and liver function tests.   Lab Results  Component Value Date   CHOL 186 11/29/2021   HDL 104 (A) 11/29/2021   LDLCALC 70 11/29/2021   TRIG 61 11/29/2021   CHOLHDL 2 08/17/2021       Hyperglycemia    Low carb diet and exercise.  Back on prednisone. Follow met b and a1c.       Hypothyroidism    On synthroid.  Follow tsh.       Stress    Increased stress with current medical issues, prednisone, etc.  Discussed.  Does not feel needs further intervention at this time.  Overall she feels she is doing better.  Follow.       Temporal arteritis ([place])    Followed by rheumatology.  Off prednisone 09/2021.  Reevaluated 11/09/21 - elevated ESR and CRP.  Started back on prednisone 44m q day.  Doing well. Feels good.  No headache or visual disturbance.  Has f/u labs soon.          CEinar Pheasant MD

## 2021-11-29 DIAGNOSIS — E7800 Pure hypercholesterolemia, unspecified: Secondary | ICD-10-CM | POA: Diagnosis not present

## 2021-11-29 DIAGNOSIS — D649 Anemia, unspecified: Secondary | ICD-10-CM | POA: Diagnosis not present

## 2021-11-29 DIAGNOSIS — R739 Hyperglycemia, unspecified: Secondary | ICD-10-CM | POA: Diagnosis not present

## 2021-11-29 DIAGNOSIS — M316 Other giant cell arteritis: Secondary | ICD-10-CM | POA: Diagnosis not present

## 2021-11-29 DIAGNOSIS — Z7952 Long term (current) use of systemic steroids: Secondary | ICD-10-CM | POA: Diagnosis not present

## 2021-11-29 DIAGNOSIS — E039 Hypothyroidism, unspecified: Secondary | ICD-10-CM | POA: Diagnosis not present

## 2021-11-29 LAB — CBC AND DIFFERENTIAL
HCT: 30 — AB (ref 36–46)
Hemoglobin: 9.7 — AB (ref 12.0–16.0)
Neutrophils Absolute: 86.2
Platelets: 252 10*3/uL (ref 150–400)
WBC: 7.7

## 2021-11-29 LAB — BASIC METABOLIC PANEL
BUN: 26 — AB (ref 4–21)
CO2: 28 — AB (ref 13–22)
Chloride: 106 (ref 99–108)
Creatinine: 1.1 (ref <=1.1)
Glucose: 113
Potassium: 5 mEq/L (ref 3.5–5.1)
Sodium: 140 (ref 137–147)

## 2021-11-29 LAB — LIPID PANEL
Cholesterol: 186 (ref 0–200)
HDL: 104 — AB (ref 35–70)
LDL Cholesterol: 70
LDl/HDL Ratio: 1.8
Triglycerides: 61 (ref 40–160)

## 2021-11-29 LAB — COMPREHENSIVE METABOLIC PANEL
Albumin: 3.7 (ref 3.5–5.0)
Calcium: 8.9 (ref 8.7–10.7)

## 2021-11-29 LAB — CBC: RBC: 3.24 — AB (ref 3.87–5.11)

## 2021-11-29 LAB — IRON,TIBC AND FERRITIN PANEL
%SAT: 35
Ferritin: 202

## 2021-11-29 LAB — HEPATIC FUNCTION PANEL
ALT: 17 U/L (ref 7–35)
AST: 17 (ref 13–35)
Alkaline Phosphatase: 70 (ref 25–125)
Bilirubin, Total: 0.4

## 2021-11-29 LAB — TSH: TSH: 0.22 — AB (ref <=5.90)

## 2021-11-29 LAB — HEMOGLOBIN A1C: Hemoglobin A1C: 6.6

## 2021-12-04 ENCOUNTER — Encounter: Payer: Self-pay | Admitting: Internal Medicine

## 2021-12-04 NOTE — Assessment & Plan Note (Addendum)
Physical today 11/27/21.  Mammogram 01/2020.  Overdue mammogram.  Discussed.  Declines. C olonoscopy 09/27/20 - pathology - rectal polyp - hyperplastic polyp.  Recommended no f/u colonoscopy.

## 2021-12-04 NOTE — Assessment & Plan Note (Signed)
On synthroid.  Follow tsh.   

## 2021-12-04 NOTE — Assessment & Plan Note (Signed)
Reports no upper GI symptoms.  Follow - on prednisone.

## 2021-12-04 NOTE — Assessment & Plan Note (Signed)
Recheck cbc and iron studies with next labs.

## 2021-12-04 NOTE — Assessment & Plan Note (Signed)
Continue crestor.  Follow lipid panel and liver function tests.   Lab Results  Component Value Date   CHOL 186 11/29/2021   HDL 104 (A) 11/29/2021   LDLCALC 70 11/29/2021   TRIG 61 11/29/2021   CHOLHDL 2 08/17/2021

## 2021-12-04 NOTE — Assessment & Plan Note (Signed)
Followed by rheumatology.  Off prednisone 09/2021.  Reevaluated 11/09/21 - elevated ESR and CRP.  Started back on prednisone $RemoveBefor'20mg'[id number]$  q day.  Doing well. Feels good.  No headache or visual disturbance.  Has f/u labs soon.

## 2021-12-04 NOTE — Assessment & Plan Note (Signed)
No headache.  No visual disturbance.  Follow.

## 2021-12-04 NOTE — Assessment & Plan Note (Signed)
With recent headache, MRA brain performed during w/up.  MRA - Mildly motion degraded exam. No intracranial large vessel occlusion or proximal high-grade arterial stenosis.  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 Both posterior cerebral arteries demonstrate distal branch atherosclerotic irregularity. 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 Apparent 1-2 mm broad-based vascular protrusion arising from the supraclinoid right ICA, which may reflect a small aneurysm or artifact related to motion.  Had referred her to neurology.  It appears they tried to schedule an appt.  No headache currently.  Feels good.  She prefers to see Dr Posey Pronto first.  States she wants to get this taken care of first and then will follow up regarding other appts/specialists.

## 2021-12-04 NOTE — Assessment & Plan Note (Signed)
Blood pressure has been doing well.  She continues on lisinopril and amlodipine. Follow blood pressures.  Follow metabolic panel.

## 2021-12-04 NOTE — Assessment & Plan Note (Signed)
Increased stress with current medical issues, prednisone, etc.  Discussed.  Does not feel needs further intervention at this time.  Overall she feels she is doing better.  Follow.

## 2021-12-04 NOTE — Assessment & Plan Note (Signed)
Low carb diet and exercise.  Back on prednisone. Follow met b and a1c.

## 2021-12-20 ENCOUNTER — Ambulatory Visit (INDEPENDENT_AMBULATORY_CARE_PROVIDER_SITE_OTHER): Payer: No Typology Code available for payment source | Admitting: Internal Medicine

## 2021-12-20 ENCOUNTER — Encounter: Payer: Self-pay | Admitting: Internal Medicine

## 2021-12-20 VITALS — BP 118/60 | HR 84 | Temp 98.7°F | Ht 64.0 in | Wt 129.4 lb

## 2021-12-20 DIAGNOSIS — I10 Essential (primary) hypertension: Secondary | ICD-10-CM | POA: Diagnosis not present

## 2021-12-20 DIAGNOSIS — M316 Other giant cell arteritis: Secondary | ICD-10-CM | POA: Diagnosis not present

## 2021-12-20 DIAGNOSIS — E039 Hypothyroidism, unspecified: Secondary | ICD-10-CM | POA: Diagnosis not present

## 2021-12-20 DIAGNOSIS — D649 Anemia, unspecified: Secondary | ICD-10-CM | POA: Diagnosis not present

## 2021-12-20 LAB — CBC WITH DIFFERENTIAL/PLATELET
Basophils Absolute: 0.1 10*3/uL (ref 0.0–0.1)
Basophils Relative: 0.7 % (ref 0.0–3.0)
Eosinophils Absolute: 0 10*3/uL (ref 0.0–0.7)
Eosinophils Relative: 0.1 % (ref 0.0–5.0)
HCT: 37.5 % (ref 36.0–46.0)
Hemoglobin: 12.2 g/dL (ref 12.0–15.0)
Lymphocytes Relative: 44.4 % (ref 12.0–46.0)
Lymphs Abs: 4.9 10*3/uL — ABNORMAL HIGH (ref 0.7–4.0)
MCHC: 32.6 g/dL (ref 30.0–36.0)
MCV: 95.3 fl (ref 78.0–100.0)
Monocytes Absolute: 0.5 10*3/uL (ref 0.1–1.0)
Monocytes Relative: 4.5 % (ref 3.0–12.0)
Neutro Abs: 5.6 10*3/uL (ref 1.4–7.7)
Neutrophils Relative %: 50.3 % (ref 43.0–77.0)
Platelets: 287 10*3/uL (ref 150.0–400.0)
RBC: 3.93 Mil/uL (ref 3.87–5.11)
RDW: 24.2 % — ABNORMAL HIGH (ref 11.5–15.5)
WBC: 11 10*3/uL — ABNORMAL HIGH (ref 4.0–10.5)

## 2021-12-20 LAB — VITAMIN B12: Vitamin B-12: 833 pg/mL (ref 211–911)

## 2021-12-20 NOTE — Progress Notes (Signed)
[name], female   DOB: [birth year], 76 y.o.   MRN: [id number]   Subjective:    [name], female    DOB: 04/30/1945, 76 y.o.   MRN: [id number]   Patient here for  Chief Complaint  Patient presents with   Follow-up   .   HPI Work in appt - recent labs - decreased hgb. Followed by rheumatology for TA.  On prednisone.  Tapering.  On 17.46m now.  Recent labs through rheumatology.  Hgb 9.7.  discussed today.  Denies any  acid reflux.  No nausea or vomiting.  No abdominal pain.  No bowel change.  No bleeding or melena.  Discussed further w/up and evaluation.  Declines at this time.  Did agree to f/u labs.    Past Medical History:  Diagnosis Date   GERD (gastroesophageal reflux disease)    Goiter    s/p thyroidectomy   H/O syncope    Hypercholesterolemia    Hypothyroidism    Recurrent sinus infections    and allergy problems   Past Surgical History:  Procedure Laterality Date   ARTERY BIOPSY Right 03/13/2021   Procedure: BIOPSY TEMPORAL ARTERY;  Surgeon: BRobert Bellow MD;  Location: [facility];  Service: General;  Laterality: Right;   BREAST BIOPSY     COLONOSCOPY WITH PROPOFOL N/A 11/03/2014   Procedure: COLONOSCOPY WITH PROPOFOL;  Surgeon: RManya Silvas MD;  [place] [facility]  Service: Endoscopy;  Laterality: N/A;   COLONOSCOPY WITH PROPOFOL N/A 02/12/2018   Procedure: COLONOSCOPY WITH PROPOFOL;  Surgeon: EManya Silvas MD;  [facility];  Service: Endoscopy;  Laterality: N/A;   [place]  ESOPHAGOGASTRODUODENOSCOPY N/A 11/03/2014   Procedure: ESOPHAGOGASTRODUODENOSCOPY (EGD);  Surgeon: RManya Silvas MD;  [facility];  Service: Endoscopy;  Laterality: N/A;   LAPAROSCOPIC APPENDECTOMY N/A 11/26/2014   Procedure: APPENDECTOMY LAPAROSCOPIC;  Surgeon: JRobert Bellow MD;  Location: [facility];  Service: General;  Laterality: N/A;   thyroidectomy  1979   goiter   TUBAL  LIGATION  1975   Family History  Problem Relation Age of Onset   Tuberculosis Paternal Grandfather    Alzheimer's disease Mother    Goiter Mother    Cancer Brother    Cancer Brother    Cancer Brother    Cancer Brother    Social History   Socioeconomic History   Marital status: Married    Spouse name: Not on file   Number of children: Not on file   Years of education: Not on file   Highest education level: Not on file  Occupational History   Not on file  Tobacco Use   Smoking status: Former    Types: Cigarettes    Quit date: 04/16/2009    Years since quitting: 12.7   Smokeless tobacco: Never  Substance and Sexual Activity   Alcohol use: Yes    Alcohol/week: 3.0 - 4.0 standard drinks of alcohol    Types: 3 - 4 Standard drinks or equivalent per week    Comment: occ   Drug use: Yes    Types: Marijuana    Comment: marijuana every day   Sexual activity: Not on file  Other Topics Concern   Not on file  Social History Narrative   Not on file   Social Determinants of Health   Financial Resource Strain: Low Risk  (05/23/2021)   Overall Financial Resource Strain (CARDIA)  Difficulty of Paying Living Expenses: Not hard at all  Food Insecurity: No Food Insecurity (05/23/2021)   Hunger Vital Sign    Worried About Running Out of Food in the Last Year: Never true    Ran Out of Food in the Last Year: Never true  Transportation Needs: No Transportation Needs (05/23/2021)   PRAPARE - Hydrologist (Medical): No    Lack of Transportation (Non-Medical): No  Physical Activity: Insufficiently Active (05/23/2021)   Exercise Vital Sign    Days of Exercise per Week: 3 days    Minutes of Exercise per Session: 30 min  Stress: No Stress Concern Present (05/23/2021)   [place]    Feeling of Stress : Not at all  Social Connections: Unknown (05/23/2021)   Social Connection and Isolation Panel [NHANES]     Frequency of Communication with Friends and Family: More than three times a week    Frequency of Social Gatherings with Friends and Family: More than three times a week    Attends Religious Services: Not on Advertising copywriter or Organizations: Not on file    Attends Archivist Meetings: Not on file    Marital Status: Not on file     Review of Systems  Constitutional:  Negative for appetite change and unexpected weight change.  HENT:  Negative for congestion and sinus pressure.   Respiratory:  Negative for cough, chest tightness and shortness of breath.   Cardiovascular:  Negative for chest pain and palpitations.  Gastrointestinal:  Negative for abdominal pain, diarrhea, nausea and vomiting.  Genitourinary:  Negative for difficulty urinating and dysuria.  Musculoskeletal:  Negative for joint swelling and myalgias.  Skin:  Negative for color change and rash.  Neurological:  Negative for dizziness, light-headedness and headaches.  Psychiatric/Behavioral:  Negative for agitation and dysphoric mood.        Objective:     BP 118/60 (BP Location: Left Arm, Patient Position: Sitting, Cuff Size: Normal)   Pulse 84   Temp 98.7 F (37.1 C) (Oral)   Ht _0  (1.626 m)   Wt 129 lb 6.4 oz (58.7 kg)   SpO2 96%   BMI 22.21 kg/m  Wt Readings from Last 3 Encounters:  12/20/21 129 lb 6.4 oz (58.7 kg)  11/27/21 127 lb (57.6 kg)  09/27/21 134 lb 3.2 oz (60.9 kg)    Physical Exam Vitals reviewed.  Constitutional:      General: She is not in acute distress.    Appearance: Normal appearance.  HENT:     Head: Normocephalic and atraumatic.     Right Ear: External ear normal.     Left Ear: External ear normal.  Eyes:     General: No scleral icterus.       Right eye: No discharge.        Left eye: No discharge.     Conjunctiva/sclera: Conjunctivae normal.  Neck:     Thyroid: No thyromegaly.  Cardiovascular:     Rate and Rhythm: Normal rate and regular rhythm.   Pulmonary:     Effort: No respiratory distress.     Breath sounds: Normal breath sounds. No wheezing.  Abdominal:     General: Bowel sounds are normal.     Palpations: Abdomen is soft.     Tenderness: There is no abdominal tenderness.  Musculoskeletal:        General: No swelling or tenderness.  Cervical back: Neck supple. No tenderness.  Lymphadenopathy:     Cervical: No cervical adenopathy.  Skin:    Findings: No erythema or rash.  Neurological:     Mental Status: She is alert.  Psychiatric:        Mood and Affect: Mood normal.        Behavior: Behavior normal.      Outpatient Encounter Medications as of 12/20/2021  Medication Sig   amLODipine (NORVASC) 2.5 MG tablet Take 2.5 mg by mouth daily.   cyanocobalamin 1000 MCG tablet Take 1,000 mcg by mouth daily.   ferrous sulfate 325 (65 FE) MG tablet Take 325 mg by mouth daily with breakfast.   folic acid (FOLVITE) 1 MG tablet Take 1 mg by mouth daily.   levothyroxine (SYNTHROID) 75 MCG tablet TAKE 1 TABLET BY MOUTH EVERY DAY   lisinopril (ZESTRIL) 40 MG tablet Take 1 tablet (40 mg total) by mouth daily.   predniSONE (DELTASONE) 1 MG tablet Take 1 mg by mouth daily with breakfast. Take 1 mg by mouth daily for the next 7 days.   rosuvastatin (CRESTOR) 10 MG tablet TAKE 1 TABLET BY MOUTH EVERY DAY   No facility-administered encounter medications on file as of 12/20/2021.     Lab Results  Component Value Date   WBC 11.0 (H) 12/20/2021   HGB 12.2 12/20/2021   HCT 37.5 12/20/2021   PLT 287.0 12/20/2021   GLUCOSE 88 08/17/2021   CHOL 186 11/29/2021   TRIG 61 11/29/2021   HDL 104 (A) 11/29/2021   LDLCALC 70 11/29/2021   ALT 17 11/29/2021   AST 17 11/29/2021   NA 140 11/29/2021   K 5.0 11/29/2021   CL 106 11/29/2021   CREATININE 1.1 11/29/2021   BUN 26 (A) 11/29/2021   CO2 28 (A) 11/29/2021   TSH 0.22 (A) 11/29/2021   INR 1.2 06/07/2021   HGBA1C 6.6 11/29/2021    DG Chest Port 1 View  Result Date:  06/07/2021 CLINICAL DATA:  Weakness and dizziness. EXAM: PORTABLE CHEST 1 VIEW COMPARISON:  September 25, 2011 FINDINGS: The heart size and mediastinal contours are within normal limits. Both lungs are clear. The visualized skeletal structures are unremarkable. IMPRESSION: No active disease. Electronically Signed   By: Virgina Norfolk M.D.   On: 06/07/2021 01:02       Assessment & Plan:   Problem List Items Addressed This Visit     Anemia - Primary    Discussed recent decreased hgb.  Discussed the need for further evaluation and w/up.  Colonoscopy 09/27/20 - pathology - rectal polyp - hyperplastic polyp.  Recommended no f/u colonoscopy.  No GI symptoms currently.  Agreeable to repeat cbc and B12 today.  Further w/up pending results.  Declines further w/up at this time.       Relevant Orders   CBC with Differential/Platelet (Completed)   Vitamin B12 (Completed)   Essential hypertension, benign    Blood pressure has been doing well.  She continues on lisinopril and amlodipine. Follow blood pressures.  Follow metabolic panel.       Hypothyroidism    On synthroid.  Follow tsh.       Temporal arteritis ([place])    Followed by rheumatology.  Reevaluated 11/09/21 - elevated ESR and CRP.  Started back on prednisone 81m q day.  Is not on 17.563mq day.  No headache or visual disturbance.  Continue f/u with rheumatology.          ChEinar PheasantMD

## 2021-12-31 ENCOUNTER — Encounter: Payer: Self-pay | Admitting: Internal Medicine

## 2021-12-31 NOTE — Assessment & Plan Note (Signed)
On synthroid.  Follow tsh.   

## 2021-12-31 NOTE — Assessment & Plan Note (Addendum)
Discussed recent decreased hgb.  Discussed the need for further evaluation and w/up.  Colonoscopy 09/27/20 - pathology - rectal polyp - hyperplastic polyp.  Recommended no f/u colonoscopy.  No GI symptoms currently.  Agreeable to repeat cbc and B12 today.  Further w/up pending results.  Declines further w/up at this time.

## 2021-12-31 NOTE — Assessment & Plan Note (Signed)
Blood pressure has been doing well.  She continues on lisinopril and amlodipine. Follow blood pressures.  Follow metabolic panel.

## 2021-12-31 NOTE — Assessment & Plan Note (Signed)
Followed by rheumatology.  Reevaluated 11/09/21 - elevated ESR and CRP.  Started back on prednisone $RemoveBefor'20mg'TmkwFeFJXdsg$  q day.  Is not on 17.$RemoveB'5mg'TppogJxA$  q day.  No headache or visual disturbance.  Continue f/u with rheumatology.

## 2022-01-10 DIAGNOSIS — Z7952 Long term (current) use of systemic steroids: Secondary | ICD-10-CM | POA: Diagnosis not present

## 2022-01-10 DIAGNOSIS — M316 Other giant cell arteritis: Secondary | ICD-10-CM | POA: Diagnosis not present

## 2022-02-18 ENCOUNTER — Other Ambulatory Visit: Payer: Self-pay | Admitting: Internal Medicine

## 2022-02-19 NOTE — Telephone Encounter (Signed)
Received notification that she was overdue mammogram.  Need to schedule.  Also, had discussed with her regarding f/u of previous finding on head scan.  Had referred her to neurology.  If agreeable, would like to schedule f/u now.

## 2022-02-20 NOTE — Telephone Encounter (Signed)
S/w pt - declined mammogram and Neurology f/u at this time. Stated she is trying to work on getting of prednisone and stated "one thing at a time. I cannot do more than that"

## 2022-02-26 ENCOUNTER — Other Ambulatory Visit: Payer: Self-pay | Admitting: Internal Medicine

## 2022-03-05 ENCOUNTER — Other Ambulatory Visit: Payer: Self-pay

## 2022-03-05 ENCOUNTER — Telehealth: Payer: Self-pay | Admitting: Internal Medicine

## 2022-03-05 MED ORDER — LEVOTHYROXINE SODIUM 75 MCG PO TABS: 75.0000 ug | ORAL_TABLET | Freq: Every day | ORAL | 1 refills | Status: DC

## 2022-03-05 NOTE — Telephone Encounter (Signed)
SENT 

## 2022-03-05 NOTE — Telephone Encounter (Signed)
Pt need refill on levothyroxine sent to [facility]

## 2022-03-29 ENCOUNTER — Telehealth: Payer: Self-pay | Admitting: Internal Medicine

## 2022-03-29 NOTE — Telephone Encounter (Signed)
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 Pt states she can not turn on her left side to sleep because it hurts so bad when the arthritis comes on her

## 2022-03-30 NOTE — Telephone Encounter (Signed)
Pt advised.

## 2022-03-30 NOTE — Telephone Encounter (Signed)
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   Needs to call them and let them know.  Needs to be reevaluated.

## 2022-04-11 DIAGNOSIS — Z7952 Long term (current) use of systemic steroids: Secondary | ICD-10-CM | POA: Diagnosis not present

## 2022-04-11 DIAGNOSIS — M19012 Primary osteoarthritis, left shoulder: Secondary | ICD-10-CM | POA: Diagnosis not present

## 2022-04-11 DIAGNOSIS — M7989 Other specified soft tissue disorders: Secondary | ICD-10-CM | POA: Diagnosis not present

## 2022-04-11 DIAGNOSIS — M7552 Bursitis of left shoulder: Secondary | ICD-10-CM | POA: Diagnosis not present

## 2022-04-11 DIAGNOSIS — M316 Other giant cell arteritis: Secondary | ICD-10-CM | POA: Diagnosis not present

## 2022-04-23 ENCOUNTER — Telehealth: Payer: Self-pay | Admitting: Internal Medicine

## 2022-04-23 NOTE — Telephone Encounter (Signed)
Pt would like to be called regarding her medication-thyroid

## 2022-04-24 DIAGNOSIS — M65812 Other synovitis and tenosynovitis, left shoulder: Secondary | ICD-10-CM | POA: Diagnosis not present

## 2022-04-24 DIAGNOSIS — G8929 Other chronic pain: Secondary | ICD-10-CM | POA: Diagnosis not present

## 2022-04-24 DIAGNOSIS — M19012 Primary osteoarthritis, left shoulder: Secondary | ICD-10-CM | POA: Diagnosis not present

## 2022-04-24 NOTE — Telephone Encounter (Signed)
LM for pt to cb 

## 2022-04-25 ENCOUNTER — Ambulatory Visit: Payer: No Typology Code available for payment source | Admitting: Internal Medicine

## 2022-05-02 NOTE — Telephone Encounter (Signed)
If she has been taking synthroid 20mg q day, then refill this medication and schedule her for TSH to be checked.  Thanks.

## 2022-05-02 NOTE — Telephone Encounter (Signed)
S/w pt - stating she was changed from 22mg Synthroid to 560m. Asking for 505mrefill.  In Feb 2023, I see dose adjusted to 39m65mI do not see in chart where dose decrease was advised.  Please advise

## 2022-05-03 ENCOUNTER — Other Ambulatory Visit: Payer: Self-pay

## 2022-05-03 MED ORDER — LEVOTHYROXINE SODIUM 50 MCG PO TABS: 50.0000 ug | ORAL_TABLET | Freq: Every day | ORAL | 1 refills | Status: DC

## 2022-05-03 NOTE — Telephone Encounter (Signed)
Rx sent in  Attempted to call pt , no ans, no vm

## 2022-05-25 ENCOUNTER — Ambulatory Visit (INDEPENDENT_AMBULATORY_CARE_PROVIDER_SITE_OTHER): Payer: No Typology Code available for payment source

## 2022-05-25 VITALS — Ht 64.0 in | Wt 129.0 lb

## 2022-05-25 DIAGNOSIS — Z0000 Encounter for general adult medical examination without abnormal findings: Secondary | ICD-10-CM

## 2022-05-25 NOTE — Progress Notes (Signed)
Subjective:   Jessica Wall is a 77 y.o. female who presents for Medicare Annual (Subsequent) preventive examination.  Review of Systems    No ROS.  Medicare Wellness Virtual Visit.  Visual/audio telehealth visit, UTA vital signs.   See social history for additional risk factors.   Cardiac Risk Factors include: advanced age (>43mn, >>2women);hypertension     Objective:    Today's Vitals   05/25/22 1416  Weight: 129 lb (58.5 kg)  Height: 5' 4"$  (1.626 m)   Body mass index is 22.14 kg/m.     05/25/2022    2:10 PM 05/23/2021    2:54 PM 03/13/2021    9:43 AM 03/18/2020    9:40 AM 03/18/2019    9:43 AM 02/12/2018   10:17 AM 11/26/2014   11:01 AM  Advanced Directives  Does Patient Have a Medical Advance Directive? Yes Yes No No No No No  Type of AParamedicof AMuirLiving will        Does patient want to make changes to medical advance directive? No - Patient declined        Copy of H[place] Chart? No - copy requested        Would patient like information on creating a medical advance directive?    No - Patient declined Yes (MAU/Ambulatory/Procedural Areas - Information given) Yes (MAU/Ambulatory/Procedural Areas - Information given) No - patient declined information    Current Medications (verified) Outpatient Encounter Medications as of 05/25/2022  Medication Sig   amLODipine (NORVASC) 2.5 MG tablet Take 2.5 mg by mouth daily.   cyanocobalamin 1000 MCG tablet Take 1,000 mcg by mouth daily.   ferrous sulfate 325 (65 FE) MG tablet Take 325 mg by mouth daily with breakfast.   folic acid (FOLVITE) 1 MG tablet Take 1 mg by mouth daily.   levothyroxine (SYNTHROID) 50 MCG tablet Take 1 tablet (50 mcg total) by mouth daily.   lisinopril (ZESTRIL) 40 MG tablet TAKE 1 TABLET BY MOUTH EVERY DAY   predniSONE (DELTASONE) 1 MG tablet Take 1 mg by mouth daily with breakfast. Take 1 mg by mouth daily for the next 7 days.   rosuvastatin  (CRESTOR) 10 MG tablet TAKE 1 TABLET BY MOUTH EVERY DAY   No facility-administered encounter medications on file as of 05/25/2022.    Allergies (verified) Aspirin   History: Past Medical History:  Diagnosis Date   GERD (gastroesophageal reflux disease)    Goiter    s/p thyroidectomy   H/O syncope    Hypercholesterolemia    Hypothyroidism    Recurrent sinus infections    and allergy problems   Past Surgical History:  Procedure Laterality Date   ARTERY BIOPSY Right 03/13/2021   Procedure: BIOPSY TEMPORAL ARTERY;  Surgeon: BRobert Bellow MD;  Location: [facility];  Service: General;  Laterality: Right;   BREAST BIOPSY     COLONOSCOPY WITH PROPOFOL N/A 11/03/2014   Procedure: COLONOSCOPY WITH PROPOFOL;  Surgeon: RManya Silvas MD;  [place] [facility]  Service: Endoscopy;  Laterality: N/A;   COLONOSCOPY WITH PROPOFOL N/A 02/12/2018   Procedure: COLONOSCOPY WITH PROPOFOL;  Surgeon: EManya Silvas MD;  [facility];  Service: Endoscopy;  Laterality: N/A;   [place]  ESOPHAGOGASTRODUODENOSCOPY N/A 11/03/2014   Procedure: ESOPHAGOGASTRODUODENOSCOPY (EGD);  Surgeon: RManya Silvas MD;  [facility];  Service: Endoscopy;  Laterality: N/A;   LAPAROSCOPIC APPENDECTOMY N/A 11/26/2014  Procedure: APPENDECTOMY LAPAROSCOPIC;  Surgeon: Robert Bellow, MD;  Location: [facility];  Service: General;  Laterality: N/A;   thyroidectomy  1979   goiter   TUBAL LIGATION  1975   Family History  Problem Relation Age of Onset   Tuberculosis Paternal Grandfather    Alzheimer's disease Mother    Goiter Mother    Cancer Brother    Cancer Brother    Cancer Brother    Cancer Brother    Social History   Socioeconomic History   Marital status: Married    Spouse name: Not on file   Number of children: Not on file   Years of education: Not on file   Highest education level: Not on file  Occupational History   Not on file   Tobacco Use   Smoking status: Former    Types: Cigarettes    Quit date: 04/16/2009    Years since quitting: 13.1   Smokeless tobacco: Never  Substance and Sexual Activity   Alcohol use: Yes    Alcohol/week: 3.0 - 4.0 standard drinks of alcohol    Types: 3 - 4 Standard drinks or equivalent per week    Comment: occ   Drug use: Yes    Types: Marijuana    Comment: marijuana every day   Sexual activity: Not on file  Other Topics Concern   Not on file  Social History Narrative   Not on file   Social Determinants of Health   Financial Resource Strain: Low Risk  (05/25/2022)   Overall Financial Resource Strain (CARDIA)    Difficulty of Paying Living Expenses: Not hard at all  Food Insecurity: No Food Insecurity (05/25/2022)   Hunger Vital Sign    Worried About Running Out of Food in the Last Year: Never true    Ran Out of Food in the Last Year: Never true  Transportation Needs: No Transportation Needs (05/25/2022)   PRAPARE - Hydrologist (Medical): No    Lack of Transportation (Non-Medical): No  Physical Activity: Insufficiently Active (05/25/2022)   Exercise Vital Sign    Days of Exercise per Week: 3 days    Minutes of Exercise per Session: 30 min  Stress: No Stress Concern Present (05/25/2022)   [place]    Feeling of Stress : Not at all  Social Connections: Unknown (05/25/2022)   Social Connection and Isolation Panel [NHANES]    Frequency of Communication with Friends and Family: More than three times a week    Frequency of Social Gatherings with Friends and Family: More than three times a week    Attends Religious Services: Not on Advertising copywriter or Organizations: Not on file    Attends Archivist Meetings: Not on file    Marital Status: Not on file    Tobacco Counseling Counseling given: Not Answered   Clinical Intake:  Pre-visit preparation completed:  Yes        Diabetes: No  How often do you need to have someone help you when you read instructions, pamphlets, or other written materials from your doctor or pharmacy?: 1 - Never   Interpreter Needed?: No      Activities of Daily Living    05/25/2022    2:08 PM  In your present state of health, do you have any difficulty performing the following activities:  Hearing? 0  Vision? 0  Difficulty concentrating or making decisions?  0  Walking or climbing stairs? 0  Dressing or bathing? 0  Doing errands, shopping? 0  Preparing Food and eating ? N  Using the Toilet? N  In the past six months, have you accidently leaked urine? N  Do you have problems with loss of bowel control? N  Managing your Medications? N  Managing your Finances? N  Housekeeping or managing your Housekeeping? N    Patient Care Team: Einar Pheasant, MD as PCP - General (Internal Medicine) Einar Pheasant, MD (Internal Medicine) Bary Castilla Forest Gleason, MD (General Surgery)  Indicate any recent Medical Services you may have received from other than [facility] providers in the past year (date may be approximate).     Assessment:   This is a routine wellness examination for Jessica Wall.  I connected with  Jessica Wall on 05/25/22 by a audio enabled telemedicine application and verified that I am speaking with the correct person using two identifiers.  Patient Location: Home  Provider Location: Office/Clinic  I discussed the limitations of evaluation and management by telemedicine. The patient expressed understanding and agreed to proceed.   Hearing/Vision screen Hearing Screening - Comments:: Patient is able to hear conversational tones without difficulty. No issues reported. Vision Screening - Comments:: Agrees to schedule annual eye exam.   Dietary issues and exercise activities discussed: Current Exercise Habits: Home exercise routine, Intensity: Mild Regular diet    Goals Addressed   None     Depression Screen    05/25/2022    2:13 PM 12/20/2021   11:06 AM 08/17/2021    9:23 AM 05/23/2021    2:53 PM 02/22/2021   11:09 AM 11/16/2020   11:20 AM 03/18/2020    9:40 AM  PHQ 2/9 Scores  PHQ - 2 Score 0 0 0 0 4 0 0    Fall Risk    05/25/2022    2:13 PM 12/20/2021   11:06 AM 08/17/2021    9:23 AM 05/23/2021    2:55 PM 02/22/2021   11:10 AM  [place] in the past year? 0 0 0 0 0  Number falls in past yr: 0 0  0 0  Injury with Fall?  0   0  Risk for fall due to :  No Fall Risks No Fall Risks  No Fall Risks  Follow up Falls evaluation completed;Falls prevention discussed Falls evaluation completed Falls evaluation completed Falls evaluation completed Falls evaluation completed    FALL RISK PREVENTION PERTAINING TO THE HOME: Home free of loose throw rugs in walkways, pet beds, electrical cords, etc? Yes  Adequate lighting in your home to reduce risk of falls? Yes   ASSISTIVE DEVICES UTILIZED TO PREVENT FALLS: Life alert? No  Use of a cane, walker or w/c? No   TIMED UP AND GO: Was the test performed? No .   Cognitive Function:        05/25/2022    2:33 PM 03/18/2019    9:48 AM  6CIT Screen  What Year? 0 points 0 points  What month? 0 points 0 points  What time? 0 points 0 points  Count back from 20 0 points 0 points  Months in reverse 0 points 0 points  Repeat phrase 0 points 0 points  Total Score 0 points 0 points    Immunizations  There is no immunization history on file for this patient.  TDAP status: Due, Education has been provided regarding the importance of this vaccine. Advised may receive this vaccine at local  pharmacy or Health Dept. Aware to provide a copy of the vaccination record if obtained from local pharmacy or Health Dept. Verbalized acceptance and understanding.  Pneumococcal vaccine status: Declined,  Education has been provided regarding the importance of this vaccine but patient still declined. Advised may receive this vaccine at local pharmacy  or Health Dept. Aware to provide a copy of the vaccination record if obtained from local pharmacy or Health Dept. Verbalized acceptance and understanding.   Shingrix Completed?: No.    Education has been provided regarding the importance of this vaccine. Patient has been advised to call insurance company to determine out of pocket expense if they have not yet received this vaccine. Advised may also receive vaccine at local pharmacy or Health Dept. Verbalized acceptance and understanding.  Screening Tests Health Maintenance  Topic Date Due   DTaP/Tdap/Td (1 - Tdap) Never done   MAMMOGRAM  06/15/2022 (Originally 02/02/2021)   INFLUENZA VACCINE  07/15/2022 (Originally 11/14/2021)   Pneumonia Vaccine 28+ Years old (1 of 1 - PCV) 08/15/2022 (Originally 06/27/2012)   Zoster Vaccines- Shingrix (1 of 2) 08/23/2022 (Originally 06/27/1997)   DEXA SCAN  05/19/2024 (Originally 06/27/2012)   Medicare Annual Wellness (AWV)  05/26/2023   COLONOSCOPY (Pts 45-17yr Insurance coverage will need to be confirmed)  10/27/2023   Hepatitis C Screening  Completed   HPV VACCINES  Aged Out   COVID-19 Vaccine  Discontinued    Health Maintenance Health Maintenance Due  Topic Date Due   DTaP/Tdap/Td (1 - Tdap) Never done   Mammogram- deferred due to audio visit.  Lung Cancer Screening: (Low Dose CT Chest recommended if Age 80107-80years, 30 pack-year currently smoking OR have quit w/in 15years.) does not qualify.   Hepatitis C Screening: Completed 04/2020.   Vision Screening: Recommended annual ophthalmology exams for early detection of glaucoma and other disorders of the eye.  Dental Screening: Recommended annual dental exams for proper oral hygiene  Community Resource Referral / Chronic Care Management: CRR required this visit?  No   CCM required this visit?  No      Plan:     I have personally reviewed and noted the following in the patient's chart:   Medical and social history Use of alcohol, tobacco  or illicit drugs  Current medications and supplements including opioid prescriptions. Patient is not currently taking opioid prescriptions. Functional ability and status Nutritional status Physical activity Advanced directives List of other physicians Hospitalizations, surgeries, and ER visits in previous 12 months Vitals Screenings to include cognitive, depression, and falls Referrals and appointments  In addition, I have reviewed and discussed with patient certain preventive protocols, quality metrics, and best practice recommendations. A written personalized care plan for preventive services as well as general preventive health recommendations were provided to patient.     DLeta Jungling LPN   [id number]

## 2022-05-25 NOTE — Patient Instructions (Addendum)
Jessica Wall , Thank you for taking time to come for your Medicare Wellness Visit. I appreciate your ongoing commitment to your health goals. Please review the following plan we discussed and let me know if I can assist you in the future.   These are the goals we discussed:  Goals      Follow up with Primary Care Provider     As needed Schedule mammogram Schedule eye exam        This is a list of the screening recommended for you and due dates:  Health Maintenance  Topic Date Due   DTaP/Tdap/Td vaccine (1 - Tdap) Never done   Mammogram  06/15/2022*   Flu Shot  07/15/2022*   Pneumonia Vaccine (1 of 1 - PCV) 08/15/2022*   Zoster (Shingles) Vaccine (1 of 2) 08/23/2022*   DEXA scan (bone density measurement)  05/19/2024*   Medicare Annual Wellness Visit  05/26/2023   Colon Cancer Screening  10/27/2023   Hepatitis C Screening: USPSTF Recommendation to screen - Ages 18-79 yo.  Completed   HPV Vaccine  Aged Out   COVID-19 Vaccine  Discontinued  *Topic was postponed. The date shown is not the original due date.    Advanced directives: End of life planning; Advance aging; Advanced directives discussed.  Copy of current HCPOA/Living Will requested.    Conditions/risks identified: none new  Next appointment: Follow up in one year for your annual wellness visit    Preventive Care 65 Years and Older, Female Preventive care refers to lifestyle choices and visits with your health care provider that can promote health and wellness. What does preventive care include? A yearly physical exam. This is also called an annual well check. Dental exams once or twice a year. Routine eye exams. Ask your health care provider how often you should have your eyes checked. Personal lifestyle choices, including: Daily care of your teeth and gums. Regular physical activity. Eating a healthy diet. Avoiding tobacco and drug use. Limiting alcohol use. Practicing safe sex. Taking low-dose aspirin every  day. Taking vitamin and mineral supplements as recommended by your health care provider. What happens during an annual well check? The services and screenings done by your health care provider during your annual well check will depend on your age, overall health, lifestyle risk factors, and family history of disease. Counseling  Your health care provider may ask you questions about your: Alcohol use. Tobacco use. Drug use. Emotional well-being. Home and relationship well-being. Sexual activity. Eating habits. History of falls. Memory and ability to understand (cognition). Work and work Statistician. Reproductive health. Screening  You may have the following tests or measurements: Height, weight, and BMI. Blood pressure. Lipid and cholesterol levels. These may be checked every 5 years, or more frequently if you are over 46 years old. Skin check. Lung cancer screening. You may have this screening every year starting at age 86 if you have a 30-pack-year history of smoking and currently smoke or have quit within the past 15 years. Fecal occult blood test (FOBT) of the stool. You may have this test every year starting at age 54. Flexible sigmoidoscopy or colonoscopy. You may have a sigmoidoscopy every 5 years or a colonoscopy every 10 years starting at age 36. Hepatitis C blood test. Hepatitis B blood test. Sexually transmitted disease (STD) testing. Diabetes screening. This is done by checking your blood sugar (glucose) after you have not eaten for a while (fasting). You may have this done every 1-3 years. Bone density scan.  This is done to screen for osteoporosis. You may have this done starting at age 20. Mammogram. This may be done every 1-2 years. Talk to your health care provider about how often you should have regular mammograms. Talk with your health care provider about your test results, treatment options, and if necessary, the need for more tests. Vaccines  Your health care  provider may recommend certain vaccines, such as: Influenza vaccine. This is recommended every year. Tetanus, diphtheria, and acellular pertussis (Tdap, Td) vaccine. You may need a Td booster every 10 years. Zoster vaccine. You may need this after age 83. Pneumococcal 13-valent conjugate (PCV13) vaccine. One dose is recommended after age 78. Pneumococcal polysaccharide (PPSV23) vaccine. One dose is recommended after age 80. Talk to your health care provider about which screenings and vaccines you need and how often you need them. This information is not intended to replace advice given to you by your health care provider. Make sure you discuss any questions you have with your health care provider. Document Released: 04/29/2015 Document Revised: 12/21/2015 Document Reviewed: 02/01/2015 Elsevier Interactive Patient Education  2017 [place] Prevention in the Home Falls can cause injuries. They can happen to people of all ages. There are many things you can do to make your home safe and to help prevent falls. What can I do on the outside of my home? Regularly fix the edges of walkways and driveways and fix any cracks. Remove anything that might make you trip as you walk through a door, such as a raised step or threshold. Trim any bushes or trees on the path to your home. Use bright outdoor lighting. Clear any walking paths of anything that might make someone trip, such as rocks or tools. Regularly check to see if handrails are loose or broken. Make sure that both sides of any steps have handrails. Any raised decks and porches should have guardrails on the edges. Have any leaves, snow, or ice cleared regularly. Use sand or salt on walking paths during winter. Clean up any spills in your garage right away. This includes oil or grease spills. What can I do in the bathroom? Use night lights. Install grab bars by the toilet and in the tub and shower. Do not use towel bars as grab  bars. Use non-skid mats or decals in the tub or shower. If you need to sit down in the shower, use a plastic, non-slip stool. Keep the floor dry. Clean up any water that spills on the floor as soon as it happens. Remove soap buildup in the tub or shower regularly. Attach bath mats securely with double-sided non-slip rug tape. Do not have throw rugs and other things on the floor that can make you trip. What can I do in the bedroom? Use night lights. Make sure that you have a light by your bed that is easy to reach. Do not use any sheets or blankets that are too big for your bed. They should not hang down onto the floor. Have a firm chair that has side arms. You can use this for support while you get dressed. Do not have throw rugs and other things on the floor that can make you trip. What can I do in the kitchen? Clean up any spills right away. Avoid walking on wet floors. Keep items that you use a lot in easy-to-reach places. If you need to reach something above you, use a strong step stool that has a grab bar. Keep electrical cords  out of the way. Do not use floor polish or wax that makes floors slippery. If you must use wax, use non-skid floor wax. Do not have throw rugs and other things on the floor that can make you trip. What can I do with my stairs? Do not leave any items on the stairs. Make sure that there are handrails on both sides of the stairs and use them. Fix handrails that are broken or loose. Make sure that handrails are as long as the stairways. Check any carpeting to make sure that it is firmly attached to the stairs. Fix any carpet that is loose or worn. Avoid having throw rugs at the top or bottom of the stairs. If you do have throw rugs, attach them to the floor with carpet tape. Make sure that you have a light switch at the top of the stairs and the bottom of the stairs. If you do not have them, ask someone to add them for you. What else can I do to help prevent  falls? Wear shoes that: Do not have high heels. Have rubber bottoms. Are comfortable and fit you well. Are closed at the toe. Do not wear sandals. If you use a stepladder: Make sure that it is fully opened. Do not climb a closed stepladder. Make sure that both sides of the stepladder are locked into place. Ask someone to hold it for you, if possible. Clearly mark and make sure that you can see: Any grab bars or handrails. First and last steps. Where the edge of each step is. Use tools that help you move around (mobility aids) if they are needed. These include: Canes. Walkers. Scooters. Crutches. Turn on the lights when you go into a dark area. Replace any light bulbs as soon as they burn out. Set up your furniture so you have a clear path. Avoid moving your furniture around. If any of your floors are uneven, fix them. If there are any pets around you, be aware of where they are. Review your medicines with your doctor. Some medicines can make you feel dizzy. This can increase your chance of falling. Ask your doctor what other things that you can do to help prevent falls. This information is not intended to replace advice given to you by your health care provider. Make sure you discuss any questions you have with your health care provider. Document Released: 01/27/2009 Document Revised: 09/08/2015 Document Reviewed: 05/07/2014 Elsevier Interactive Patient Education  2017 [employer].

## 2022-07-11 DIAGNOSIS — M316 Other giant cell arteritis: Secondary | ICD-10-CM | POA: Diagnosis not present

## 2022-07-11 DIAGNOSIS — Z7952 Long term (current) use of systemic steroids: Secondary | ICD-10-CM | POA: Diagnosis not present

## 2022-07-11 DIAGNOSIS — M1990 Unspecified osteoarthritis, unspecified site: Secondary | ICD-10-CM | POA: Diagnosis not present

## 2022-08-14 ENCOUNTER — Ambulatory Visit (INDEPENDENT_AMBULATORY_CARE_PROVIDER_SITE_OTHER): Payer: No Typology Code available for payment source | Admitting: Internal Medicine

## 2022-08-14 VITALS — BP 110/70 | HR 65 | Temp 98.4°F | Resp 16 | Ht 63.0 in | Wt 119.0 lb

## 2022-08-14 DIAGNOSIS — Z86010 Personal history of colonic polyps: Secondary | ICD-10-CM

## 2022-08-14 DIAGNOSIS — E7800 Pure hypercholesterolemia, unspecified: Secondary | ICD-10-CM | POA: Diagnosis not present

## 2022-08-14 DIAGNOSIS — R9089 Other abnormal findings on diagnostic imaging of central nervous system: Secondary | ICD-10-CM | POA: Diagnosis not present

## 2022-08-14 DIAGNOSIS — R634 Abnormal weight loss: Secondary | ICD-10-CM

## 2022-08-14 DIAGNOSIS — M316 Other giant cell arteritis: Secondary | ICD-10-CM | POA: Diagnosis not present

## 2022-08-14 DIAGNOSIS — R739 Hyperglycemia, unspecified: Secondary | ICD-10-CM | POA: Diagnosis not present

## 2022-08-14 DIAGNOSIS — I10 Essential (primary) hypertension: Secondary | ICD-10-CM

## 2022-08-14 DIAGNOSIS — E039 Hypothyroidism, unspecified: Secondary | ICD-10-CM | POA: Diagnosis not present

## 2022-08-14 DIAGNOSIS — F439 Reaction to severe stress, unspecified: Secondary | ICD-10-CM | POA: Diagnosis not present

## 2022-08-14 DIAGNOSIS — D649 Anemia, unspecified: Secondary | ICD-10-CM

## 2022-08-14 DIAGNOSIS — R944 Abnormal results of kidney function studies: Secondary | ICD-10-CM

## 2022-08-14 MED ORDER — LISINOPRIL 40 MG PO TABS: 40.0000 mg | ORAL_TABLET | Freq: Every day | ORAL | 1 refills | Status: DC

## 2022-08-14 MED ORDER — LEVOTHYROXINE SODIUM 50 MCG PO TABS: 50.0000 ug | ORAL_TABLET | Freq: Every day | ORAL | 1 refills | Status: DC

## 2022-08-14 NOTE — Progress Notes (Signed)
Subjective:    [name], female    DOB: 02-04-1946, 77 y.o.   MRN: [id number]  Patient here for  Chief Complaint  Patient presents with   Medical Management of Chronic Issues    HPI Here to follow up regarding giant cell arteritis, hypertension and hypothyroidism.  Off prednisone.  Off since 07/27/22. Increased stress related to prednisone therapy.  States it changed her taste for food.  She is eating.  Weight is down.  Feels will improved the longer off prednisone.  Discussed further w/up.  She wants to monitor and see if improves.  No chest pain.  Breathing stable.  No increased cough or congestion.  No abdominal pain.  S/p injection - shoulder.     Past Medical History:  Diagnosis Date   GERD (gastroesophageal reflux disease)    Goiter    s/p thyroidectomy   H/O syncope    Hypercholesterolemia    Hypothyroidism    Recurrent sinus infections    and allergy problems   Past Surgical History:  Procedure Laterality Date   ARTERY BIOPSY Right 03/13/2021   Procedure: BIOPSY TEMPORAL ARTERY;  Surgeon: Earline Mayotte, MD;  Location: [facility] ORS;  Service: General;  Laterality: Right;   BREAST BIOPSY     COLONOSCOPY WITH PROPOFOL N/A 11/03/2014   Procedure: COLONOSCOPY WITH PROPOFOL;  Surgeon: Scot Jun, MD;  Location: [facility] ENDOSCOPY;  Service: Endoscopy;  Laterality: N/A;   COLONOSCOPY WITH PROPOFOL N/A 02/12/2018   Procedure: COLONOSCOPY WITH PROPOFOL;  Surgeon: Scot Jun, MD;  Location: [facility] ENDOSCOPY;  Service: Endoscopy;  Laterality: N/A;   DILATION AND CURETTAGE OF UTERUS  1980 & 1993   ESOPHAGOGASTRODUODENOSCOPY N/A 11/03/2014   Procedure: ESOPHAGOGASTRODUODENOSCOPY (EGD);  Surgeon: Scot Jun, MD;  Location: [facility] ENDOSCOPY;  Service: Endoscopy;  Laterality: N/A;   LAPAROSCOPIC APPENDECTOMY N/A 11/26/2014   Procedure: APPENDECTOMY LAPAROSCOPIC;  Surgeon: Earline Mayotte, MD;  Location: [facility] ORS;  Service: General;  Laterality: N/A;    thyroidectomy  1979   goiter   TUBAL LIGATION  1975   Family History  Problem Relation Age of Onset   Tuberculosis Paternal Grandfather    Alzheimer's disease Mother    Goiter Mother    Cancer Brother    Cancer Brother    Cancer Brother    Cancer Brother    Social History   Socioeconomic History   Marital status: Married    Spouse name: Not on file   Number of children: Not on file   Years of education: Not on file   Highest education level: Not on file  Occupational History   Not on file  Tobacco Use   Smoking status: Former    Types: Cigarettes    Quit date: 04/16/2009    Years since quitting: 13.3   Smokeless tobacco: Never  Substance and Sexual Activity   Alcohol use: Yes    Alcohol/week: 3.0 - 4.0 standard drinks of alcohol    Types: 3 - 4 Standard drinks or equivalent per week    Comment: occ   Drug use: Yes    Types: Marijuana    Comment: marijuana every day   Sexual activity: Not on file  Other Topics Concern   Not on file  Social History Narrative   Not on file   Social Determinants of Health   Financial Resource Strain: Low Risk  (05/25/2022)   Overall Financial Resource Strain (CARDIA)    Difficulty of Paying Living Expenses: Not hard  at all  Food Insecurity: No Food Insecurity (05/25/2022)   Hunger Vital Sign    Worried About Running Out of Food in the Last Year: Never true    Ran Out of Food in the Last Year: Never true  Transportation Needs: No Transportation Needs (05/25/2022)   PRAPARE - Administrator, Civil Service (Medical): No    Lack of Transportation (Non-Medical): No  Physical Activity: Insufficiently Active (05/25/2022)   Exercise Vital Sign    Days of Exercise per Week: 3 days    Minutes of Exercise per Session: 30 min  Stress: No Stress Concern Present (05/25/2022)   [employer] of Occupational Health - Occupational Stress Questionnaire    Feeling of Stress : Not at all  Social Connections: Unknown (05/25/2022)   Social  Connection and Isolation Panel [NHANES]    Frequency of Communication with Friends and Family: More than three times a week    Frequency of Social Gatherings with Friends and Family: More than three times a week    Attends Religious Services: Not on file    Active Member of Clubs or Organizations: Not on file    Attends Banker Meetings: Not on file    Marital Status: Not on file     Review of Systems  Constitutional:        Eating.  Taste has changed since being on prednisone.  Weight is down.    HENT:  Negative for congestion and sinus pressure.   Respiratory:  Negative for cough, chest tightness and shortness of breath.   Cardiovascular:  Negative for chest pain and palpitations.  Gastrointestinal:  Negative for abdominal pain, diarrhea, nausea and vomiting.  Genitourinary:  Negative for difficulty urinating and dysuria.  Musculoskeletal:  Negative for joint swelling and myalgias.  Skin:  Negative for color change and rash.  Neurological:  Negative for dizziness and headaches.  Psychiatric/Behavioral:  Negative for agitation.        Increased stress related to prednisone therapy and medical issues.        Objective:     BP 110/70   Pulse 65   Temp 98.4 F (36.9 C)   Resp 16   Ht 5\' 3"  (1.6 m)   Wt 119 lb (54 kg)   SpO2 99%   BMI 21.08 kg/m  Wt Readings from Last 3 Encounters:  08/14/22 119 lb (54 kg)  05/25/22 129 lb (58.5 kg)  12/20/21 129 lb 6.4 oz (58.7 kg)    Physical Exam Vitals reviewed.  Constitutional:      General: She is not in acute distress.    Appearance: Normal appearance.  HENT:     Head: Normocephalic and atraumatic.     Right Ear: External ear normal.     Left Ear: External ear normal.  Eyes:     General: No scleral icterus.       Right eye: No discharge.        Left eye: No discharge.     Conjunctiva/sclera: Conjunctivae normal.  Neck:     Thyroid: No thyromegaly.  Cardiovascular:     Rate and Rhythm: Normal rate and  regular rhythm.  Pulmonary:     Effort: No respiratory distress.     Breath sounds: Normal breath sounds. No wheezing.  Abdominal:     General: Bowel sounds are normal.     Palpations: Abdomen is soft.     Tenderness: There is no abdominal tenderness.  Musculoskeletal:  General: No swelling or tenderness.     Cervical back: Neck supple. No tenderness.  Lymphadenopathy:     Cervical: No cervical adenopathy.  Skin:    Findings: No erythema or rash.  Neurological:     Mental Status: She is alert.  Psychiatric:        Mood and Affect: Mood normal.        Behavior: Behavior normal.      Outpatient Encounter Medications as of 08/14/2022  Medication Sig   amLODipine (NORVASC) 2.5 MG tablet Take 2.5 mg by mouth daily.   cyanocobalamin 1000 MCG tablet Take 1,000 mcg by mouth daily.   ferrous sulfate 325 (65 FE) MG tablet Take 325 mg by mouth daily with breakfast.   levothyroxine (SYNTHROID) 50 MCG tablet Take 1 tablet (50 mcg total) by mouth daily.   lisinopril (ZESTRIL) 40 MG tablet Take 1 tablet (40 mg total) by mouth daily.   rosuvastatin (CRESTOR) 10 MG tablet TAKE 1 TABLET BY MOUTH EVERY DAY   [DISCONTINUED] folic acid (FOLVITE) 1 MG tablet Take 1 mg by mouth daily.   [DISCONTINUED] levothyroxine (SYNTHROID) 50 MCG tablet Take 1 tablet (50 mcg total) by mouth daily.   [DISCONTINUED] lisinopril (ZESTRIL) 40 MG tablet TAKE 1 TABLET BY MOUTH EVERY DAY   [DISCONTINUED] predniSONE (DELTASONE) 1 MG tablet Take 1 mg by mouth daily with breakfast. Take 1 mg by mouth daily for the next 7 days.   No facility-administered encounter medications on file as of 08/14/2022.     Lab Results  Component Value Date   WBC 11.0 (H) 12/20/2021   HGB 12.2 12/20/2021   HCT 37.5 12/20/2021   PLT 287.0 12/20/2021   GLUCOSE 88 08/17/2021   CHOL 186 11/29/2021   TRIG 61 11/29/2021   HDL 104 (A) 11/29/2021   LDLCALC 70 11/29/2021   ALT 17 11/29/2021   AST 17 11/29/2021   NA 140 11/29/2021   K  5.0 11/29/2021   CL 106 11/29/2021   CREATININE 1.1 11/29/2021   BUN 26 (A) 11/29/2021   CO2 28 (A) 11/29/2021   TSH 0.22 (A) 11/29/2021   INR 1.2 06/07/2021   HGBA1C 6.6 11/29/2021    DG Chest Port 1 View  Result Date: 06/07/2021 CLINICAL DATA:  Weakness and dizziness. EXAM: PORTABLE CHEST 1 VIEW COMPARISON:  September 25, 2011 FINDINGS: The heart size and mediastinal contours are within normal limits. Both lungs are clear. The visualized skeletal structures are unremarkable. IMPRESSION: No active disease. Electronically Signed   By: Aram Candela M.D.   On: 06/07/2021 01:02       Assessment & Plan:  Abnormal MRA, brain Assessment & Plan: With recent headache, MRA brain performed during w/up.  MRA - Mildly motion degraded exam. No intracranial large vessel occlusion or proximal high-grade arterial stenosis.  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 Both posterior cerebral arteries demonstrate distal branch atherosclerotic irregularity. 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 Apparent 1-2 mm broad-based vascular protrusion arising from the supraclinoid right ICA, which may reflect a small aneurysm or artifact related to motion.  Had referred her to neurology.  It appears they tried to schedule an appt.  No headache currently. She preferred to see Dr Allena Katz first.  States she wants to get this taken care of first and then will follow up regarding other appts/specialists.     Anemia, unspecified type Assessment & Plan: Colonoscopy 09/27/20 - pathology - rectal polyp - hyperplastic polyp.  Recommended no f/u colonoscopy.  No GI symptoms currently.  Declines further w/up at this time.    Decreased GFR Assessment & Plan: GFR 59 last check.  Continue to avoid antiinflammatories.  Stay hydrated.  Follow metabolic panel.    Essential hypertension, benign Assessment & Plan: Blood pressure has been doing well.  She continues on  lisinopril and amlodipine. Follow blood pressures.  Follow metabolic panel.    History of colonic polyps Assessment & Plan: Colonoscopy 09/2020 - recommended no f/u colonoscopy.     Hypercholesterolemia Assessment & Plan: Continue crestor.  Follow lipid panel and liver function tests.   Lab Results  Component Value Date   CHOL 186 11/29/2021   HDL 104 (A) 11/29/2021   LDLCALC 70 11/29/2021   TRIG 61 11/29/2021   CHOLHDL 2 08/17/2021     Hyperglycemia Assessment & Plan: Low carb diet and exercise.  Back on prednisone. Follow met b and a1c.    Hypothyroidism, unspecified type Assessment & Plan: On synthroid.  Follow tsh.    Stress Assessment & Plan: Increased stress with current medical issues, prednisone, etc.  Discussed.  Does not feel needs further intervention at this time.  Follow.    Temporal arteritis (HCC) Assessment & Plan: Followed by rheumatology.   Off prednisone.  Doing well off.  Follow.    Weight loss Assessment & Plan: Weight is down.  She is eating.  Feels appetite will improve after being off prednisone longer.  Wants to hold on further w/up. Follow.  No nausea or vomiting.  No bowel change.    Other orders -     Levothyroxine Sodium; Take 1 tablet (50 mcg total) by mouth daily.  Dispense: 90 tablet; Refill: 1 -     Lisinopril; Take 1 tablet (40 mg total) by mouth daily.  Dispense: 90 tablet; Refill: 1     Dale Velarde, MD

## 2022-08-19 ENCOUNTER — Encounter: Payer: Self-pay | Admitting: Internal Medicine

## 2022-08-19 NOTE — Assessment & Plan Note (Signed)
Increased stress with current medical issues, prednisone, etc.  Discussed.  Does not feel needs further intervention at this time.  Follow.

## 2022-08-19 NOTE — Assessment & Plan Note (Signed)
Colonoscopy 09/2020 - recommended no f/u colonoscopy.   

## 2022-08-19 NOTE — Assessment & Plan Note (Signed)
On synthroid.  Follow tsh.   

## 2022-08-19 NOTE — Assessment & Plan Note (Signed)
Colonoscopy 09/27/20 - pathology - rectal polyp - hyperplastic polyp.  Recommended no f/u colonoscopy.  No GI symptoms currently.  Declines further w/up at this time.

## 2022-08-19 NOTE — Assessment & Plan Note (Signed)
Weight is down.  She is eating.  Feels appetite will improve after being off prednisone longer.  Wants to hold on further w/up. Follow.  No nausea or vomiting.  No bowel change.

## 2022-08-19 NOTE — Assessment & Plan Note (Signed)
Continue crestor.  Follow lipid panel and liver function tests.   Lab Results  Component Value Date   CHOL 186 11/29/2021   HDL 104 (A) 11/29/2021   LDLCALC 70 11/29/2021   TRIG 61 11/29/2021   CHOLHDL 2 08/17/2021   

## 2022-08-19 NOTE — Assessment & Plan Note (Signed)
Low carb diet and exercise.  Back on prednisone. Follow met b and a1c.  

## 2022-08-19 NOTE — Assessment & Plan Note (Signed)
With recent headache, MRA brain performed during w/up.  MRA - Mildly motion degraded exam. No intracranial large vessel occlusion or proximal high-grade arterial stenosis.  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 Both posterior cerebral arteries demonstrate distal branch atherosclerotic irregularity. 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 Apparent 1-2 mm broad-based vascular protrusion arising from the supraclinoid right ICA, which may reflect a small aneurysm or artifact related to motion.  Had referred her to neurology.  It appears they tried to schedule an appt.  No headache currently. She preferred to see Dr Allena Katz first.  States she wants to get this taken care of first and then will follow up regarding other appts/specialists.

## 2022-08-19 NOTE — Assessment & Plan Note (Signed)
GFR 59 last check.  Continue to avoid antiinflammatories.  Stay hydrated.  Follow metabolic panel.

## 2022-08-19 NOTE — Assessment & Plan Note (Signed)
Followed by rheumatology.   Off prednisone.  Doing well off.  Follow.

## 2022-08-19 NOTE — Assessment & Plan Note (Signed)
Blood pressure has been doing well.  She continues on lisinopril and amlodipine. Follow blood pressures.  Follow metabolic panel.  ?

## 2022-08-31 ENCOUNTER — Other Ambulatory Visit: Payer: Self-pay

## 2022-08-31 ENCOUNTER — Observation Stay
Admission: EM | Admit: 2022-08-31 | Discharge: 2022-09-01 | Disposition: A | Discharge status: Home / Self Care | Payer: No Typology Code available for payment source | Attending: Emergency Medicine | Admitting: Emergency Medicine

## 2022-08-31 DIAGNOSIS — R059 Cough, unspecified: Secondary | ICD-10-CM | POA: Insufficient documentation

## 2022-08-31 DIAGNOSIS — Z79899 Other long term (current) drug therapy: Secondary | ICD-10-CM | POA: Insufficient documentation

## 2022-08-31 DIAGNOSIS — E039 Hypothyroidism, unspecified: Secondary | ICD-10-CM | POA: Insufficient documentation

## 2022-08-31 DIAGNOSIS — T464X5A Adverse effect of angiotensin-converting-enzyme inhibitors, initial encounter: Secondary | ICD-10-CM | POA: Diagnosis present

## 2022-08-31 DIAGNOSIS — Z9089 Acquired absence of other organs: Secondary | ICD-10-CM | POA: Insufficient documentation

## 2022-08-31 DIAGNOSIS — K140 Glossitis: Secondary | ICD-10-CM | POA: Diagnosis present

## 2022-08-31 DIAGNOSIS — M316 Other giant cell arteritis: Secondary | ICD-10-CM | POA: Diagnosis not present

## 2022-08-31 DIAGNOSIS — Z87891 Personal history of nicotine dependence: Secondary | ICD-10-CM | POA: Diagnosis not present

## 2022-08-31 DIAGNOSIS — I10 Essential (primary) hypertension: Secondary | ICD-10-CM | POA: Diagnosis not present

## 2022-08-31 DIAGNOSIS — T783XXA Angioneurotic edema, initial encounter: Principal | ICD-10-CM

## 2022-08-31 DIAGNOSIS — M6281 Muscle weakness (generalized): Secondary | ICD-10-CM | POA: Diagnosis not present

## 2022-08-31 LAB — COMPREHENSIVE METABOLIC PANEL
ALT: 9 U/L (ref 0–44)
AST: 18 U/L (ref 15–41)
Albumin: 3.2 g/dL — ABNORMAL LOW (ref 3.5–5.0)
Alkaline Phosphatase: 70 U/L (ref 38–126)
Anion gap: 9 (ref 5–15)
BUN: 22 mg/dL (ref 8–23)
CO2: 26 mmol/L (ref 22–32)
Calcium: 8.8 mg/dL — ABNORMAL LOW (ref 8.9–10.3)
Chloride: 103 mmol/L (ref 98–111)
Creatinine, Ser: 1.15 mg/dL — ABNORMAL HIGH (ref 0.44–1.00)
GFR, Estimated: 50 mL/min — ABNORMAL LOW (ref >60)
Glucose, Bld: 102 mg/dL — ABNORMAL HIGH (ref 70–99)
Potassium: 3.9 mmol/L (ref 3.5–5.1)
Sodium: 138 mmol/L (ref 135–145)
Total Bilirubin: 0.4 mg/dL (ref 0.3–1.2)
Total Protein: 7.1 g/dL (ref 6.5–8.1)

## 2022-08-31 LAB — CBC WITH DIFFERENTIAL/PLATELET
Abs Immature Granulocytes: 0.02 10*3/uL (ref 0.00–0.07)
Basophils Absolute: 0 10*3/uL (ref 0.0–0.1)
Basophils Relative: 0 %
Eosinophils Absolute: 0.2 10*3/uL (ref 0.0–0.5)
Eosinophils Relative: 3 %
HCT: 31.7 % — ABNORMAL LOW (ref 36.0–46.0)
Hemoglobin: 10.1 g/dL — ABNORMAL LOW (ref 12.0–15.0)
Immature Granulocytes: 0 %
Lymphocytes Relative: 50 %
Lymphs Abs: 3.4 10*3/uL (ref 0.7–4.0)
MCH: 31.2 pg (ref 26.0–34.0)
MCHC: 31.9 g/dL (ref 30.0–36.0)
MCV: 97.8 fL (ref 80.0–100.0)
Monocytes Absolute: 0.5 10*3/uL (ref 0.1–1.0)
Monocytes Relative: 7 %
Neutro Abs: 2.8 10*3/uL (ref 1.7–7.7)
Neutrophils Relative %: 40 %
Platelets: 377 10*3/uL (ref 150–400)
RBC: 3.24 MIL/uL — ABNORMAL LOW (ref 3.87–5.11)
RDW: 13.5 % (ref 11.5–15.5)
WBC: 7 10*3/uL (ref 4.0–10.5)
nRBC: 0 % (ref 0.0–0.2)

## 2022-08-31 MED ORDER — C1 ESTERASE INHIBITOR (HUMAN) 500 UNITS IV KIT
1000.0000 [IU] | PACK | Freq: Once | INTRAVENOUS | Status: AC
  Administered 2022-09-01: 1000 [IU] via INTRAVENOUS
  Filled 2022-08-31: qty 1000

## 2022-08-31 MED ORDER — DIPHENHYDRAMINE HCL 50 MG/ML IJ SOLN
25.0000 mg | Freq: Once | INTRAMUSCULAR | Status: AC
  Administered 2022-08-31: 25 mg via INTRAVENOUS

## 2022-08-31 MED ORDER — DEXAMETHASONE SODIUM PHOSPHATE 10 MG/ML IJ SOLN
10.0000 mg | Freq: Once | INTRAMUSCULAR | Status: AC
  Administered 2022-08-31: 10 mg via INTRAVENOUS
  Filled 2022-08-31: qty 1

## 2022-08-31 MED ORDER — POLYETHYLENE GLYCOL 3350 17 G PO PACK: 17.0000 g | PACK | Freq: Every day | ORAL | Status: DC | PRN

## 2022-08-31 MED ORDER — LIDOCAINE VISCOUS HCL 2 % MT SOLN
15.0000 mL | Freq: Once | OROMUCOSAL | Status: AC
  Administered 2022-08-31: 15 mL via OROMUCOSAL
  Filled 2022-08-31: qty 15

## 2022-08-31 MED ORDER — ENOXAPARIN SODIUM 40 MG/0.4ML IJ SOSY
40.0000 mg | PREFILLED_SYRINGE | INTRAMUSCULAR | Status: DC
  Administered 2022-09-01: 40 mg via SUBCUTANEOUS
  Filled 2022-08-31: qty 0.4

## 2022-08-31 MED ORDER — OXYMETAZOLINE HCL 0.05 % NA SOLN
1.0000 | Freq: Once | NASAL | Status: AC
  Administered 2022-08-31: 1 via NASAL
  Filled 2022-08-31: qty 30

## 2022-08-31 MED ORDER — FAMOTIDINE IN NACL 20-0.9 MG/50ML-% IV SOLN
20.0000 mg | Freq: Once | INTRAVENOUS | Status: AC
  Administered 2022-08-31: 20 mg via INTRAVENOUS
  Filled 2022-08-31: qty 50

## 2022-08-31 MED ORDER — SODIUM CHLORIDE 0.9 % IV SOLN: INTRAVENOUS | Status: DC

## 2022-08-31 MED ORDER — DOCUSATE SODIUM 100 MG PO CAPS: 100.0000 mg | ORAL_CAPSULE | Freq: Two times a day (BID) | ORAL | Status: DC | PRN

## 2022-08-31 MED ORDER — DIPHENHYDRAMINE HCL 50 MG/ML IJ SOLN
INTRAMUSCULAR | Status: AC
  Filled 2022-08-31: qty 1

## 2022-08-31 MED ORDER — EPINEPHRINE 0.3 MG/0.3ML IJ SOAJ
INTRAMUSCULAR | Status: AC
  Filled 2022-08-31: qty 0.3

## 2022-08-31 NOTE — ED Provider Notes (Signed)
[facility] Provider Note    Event Date/Time   First MD Initiated Contact with Patient 08/31/22 2246     (approximate)   History   Oral Swelling   HPI  Jessica Wall is a 77 y.o. female past medical history of hypertension on lisinopril who presents with tongue swelling.  This started around 6 PM today when she was laying down noticed that the left side of her tongue felt swollen.  As slowly progressed over the last several hours.  She has had some difficulty speaking as a result but is still swallowing her secretions.  Denies voice changes.  No history of similar.  Does have soreness in the tongue and back of the throat related to this.  She did not take her lisinopril today.    Past Medical History:  Diagnosis Date   GERD (gastroesophageal reflux disease)    Goiter    s/p thyroidectomy   H/O syncope    Hypercholesterolemia    Hypothyroidism    Recurrent sinus infections    and allergy problems    Patient Active Problem List   Diagnosis Date Noted   Angioedema due to angiotensin converting enzyme inhibitor (ACE-I) 08/31/2022   Abnormal MRA, brain 08/27/2021   Hyperglycemia 08/17/2021   Decreased GFR 06/15/2021   Temporal arteritis (HCC) 03/27/2021   Long term current use of systemic steroids 03/17/2021   Long-term use of immunosuppressant medication 03/17/2021   Headache 02/22/2021   Stress 02/22/2021   Weight loss 09/19/2020   Breast cancer screening 10/11/2019   Hypercholesterolemia 04/27/2018   Pre-op evaluation 11/21/2014   History of colonic polyps 11/15/2014   Esophagitis 11/15/2014   Smoking 05/23/2014   Health care maintenance 05/23/2014   Syncope 09/04/2012   Essential hypertension, benign 09/04/2012   Hypothyroidism 09/04/2012   Anemia 09/04/2012   Uterine fibroid 09/04/2012     Physical Exam  Triage Vital Signs: ED Triage Vitals  Enc Vitals Group     BP 08/31/22 2147 109/74     Pulse Rate 08/31/22 2147 68     Resp  08/31/22 2147 17     Temp 08/31/22 2147 98.5 F (36.9 C)     Temp Source 08/31/22 2147 Oral     SpO2 08/31/22 2147 100 %     Weight --      Height --      Head Circumference --      Peak Flow --      Pain Score 08/31/22 2148 0     Pain Loc --      Pain Edu? --      Excl. in GC? --     Most recent vital signs: Vitals:   08/31/22 2202 08/31/22 2230  BP: (!) 117/56 (!) 110/51  Pulse: 67 65  Resp:  15  Temp:    SpO2: 99% 100%     General: Awake, no distress.  CV:  Good peripheral perfusion.  Resp:  Normal effort.  Abd:  No distention.  Neuro:             Awake, Alert, Oriented x 3  Other:  Patient has angioedema of the tongue primarily involving the left side of the tongue but does cross the midline.  The tongue is not elevated does not protrude from her mouth I am not able to visualize the left side of the posterior oropharynx with the right side does not appear swollen And has mild dysarthria related to tongue swelling but she is  able to phonate and has no hoarseness she is not having stridor no respiratory distress   ED Results / Procedures / Treatments  Labs (all labs ordered are listed, but only abnormal results are displayed) Labs Reviewed  COMPREHENSIVE METABOLIC PANEL - Abnormal; Notable for the following components:      Result Value   Glucose, Bld 102 (*)    Creatinine, Ser 1.15 (*)    Calcium 8.8 (*)    Albumin 3.2 (*)    GFR, Estimated 50 (*)    All other components within normal limits  CBC WITH DIFFERENTIAL/PLATELET - Abnormal; Notable for the following components:   RBC 3.24 (*)    Hemoglobin 10.1 (*)    HCT 31.7 (*)    All other components within normal limits  PHOSPHORUS  CBC  BASIC METABOLIC PANEL  C-REACTIVE PROTEIN  C4 COMPLEMENT  TRYPTASE     EKG     RADIOLOGY   PROCEDURES:  Critical Care performed: Yes, see critical care procedure note(s)  Fiberoptic laryngoscopy  Date/Time: 08/31/2022 10:53 PM  Performed by: Georga Hacking, MD Authorized by: Georga Hacking, MD  Consent: Verbal consent obtained. Written consent not obtained. Local anesthesia used: yes  Anesthesia: Local anesthesia used: yes Local Anesthetic: topical anesthetic Anesthetic total: 5 mL  Sedation: Patient sedated: no  Comments: Nasopharyngeal laryngoscopy was performed to evaluate the laryngeal structures given angioedema of the tongue The epiglottis arytenoids and vocal cords not appear to have swelling no pooling of secretions   .Critical Care  Performed by: Georga Hacking, MD Authorized by: Georga Hacking, MD   Critical care provider statement:    Critical care time (minutes):  30   Critical care was time spent personally by me on the following activities:  Development of treatment plan with patient or surrogate, discussions with consultants, evaluation of patient's response to treatment, examination of patient, ordering and review of laboratory studies, ordering and review of radiographic studies, ordering and performing treatments and interventions, pulse oximetry, re-evaluation of patient's condition and review of old charts   The patient is on the cardiac monitor to evaluate for evidence of arrhythmia and/or significant heart rate changes.   MEDICATIONS ORDERED IN ED: Medications  EPINEPHrine (EPI-PEN) 0.3 mg/0.3 mL injection (  Not Given 08/31/22 2220)  diphenhydrAMINE (BENADRYL) 50 MG/ML injection (  Not Given 08/31/22 2221)  docusate sodium (COLACE) capsule 100 mg (has no administration in time range)  polyethylene glycol (MIRALAX / GLYCOLAX) packet 17 g (has no administration in time range)  enoxaparin (LOVENOX) injection 40 mg (has no administration in time range)  0.9 %  sodium chloride infusion (has no administration in time range)  C1 esterase inhibitor (Human) (BERINERT) injection 1,000 Units (has no administration in time range)  dexamethasone (DECADRON) injection 10 mg (10 mg Intravenous Given 08/31/22  2212)  famotidine (PEPCID) IVPB 20 mg premix (0 mg Intravenous Stopped 08/31/22 2247)  lidocaine (XYLOCAINE) 2 % viscous mouth solution 15 mL (15 mLs Mouth/Throat Given 08/31/22 2215)  oxymetazoline (AFRIN) 0.05 % nasal spray 1 spray (1 spray Each Nare Given 08/31/22 2213)  diphenhydrAMINE (BENADRYL) injection 25 mg (25 mg Intravenous Given 08/31/22 2236)     IMPRESSION / MDM / ASSESSMENT AND PLAN / ED COURSE  I reviewed the triage vital signs and the nursing notes.                              Patient's presentation  is most consistent with acute presentation with potential threat to life or bodily function. Differential diagnosis includes, but is not limited to, ACE inhibitor induced angioedema  The patient is a 77 year old female who presents because of left tongue swelling.  This started about 4 hours prior to arrival in the ED.  It has slowly progressed over the last several hours since onset.  On arrival to ED she does have significant tongue swelling that is primarily left-sided but does cross the midline.  She has some mild dysarthria due to tongue swelling but is tolerating secretions her voice is normal she is not hoarse there is no stridor she is not in respiratory distress.  Tongue is not protruding from her mouth.  Patient was taken immediately back to a ED room.  Given Decadron Benadryl and Pepcid.  I did perform nasopharyngeal laryngoscopy to evaluate patient's lower airway structures.  The epiglottis arytenoids and vocal cords and subglottic area appears normal.  This fits with patient's clinical picture as well.  I did reach out to Dr. Azucena Fallen with ENT who agreed that there is no airway intervention necessary at this time given she is stable.  I am reassured that patient has not had significant progression of her angioedema since the onset several hours ago.  Patient will require admission for airway watch.  Did discuss with ICU as I think that at least for the first 12 hours patient  should be closely monitored for progression as if she does have increasing swelling may need intubation.      FINAL CLINICAL IMPRESSION(S) / ED DIAGNOSES   Final diagnoses:  Angioedema, initial encounter     Rx / DC Orders   ED Discharge Orders     None        Note:  This document was prepared using Dragon voice recognition software and may include unintentional dictation errors.   Georga Hacking, MD 08/31/22 [phone]

## 2022-08-31 NOTE — ED Notes (Signed)
ED Provider at bedside. 

## 2022-08-31 NOTE — ED Notes (Signed)
MD using 3.0 Rhinolaryngeal scope to visualize airway after suspected angioedema to ace inhibitors.

## 2022-08-31 NOTE — ED Notes (Signed)
McMugh, MD in triage room one to evaluate pt at this time.

## 2022-08-31 NOTE — ED Triage Notes (Signed)
Pt presents to ER with c/o tongue swelling that started a couple of hours ago.  Pt states she has not had any new foods recently, and has not been started on any new meds.  Pt does take lisinopril, and states she last took it yesterday PM.  Pt able to speak in full sentences, with no distress noted.  Tongue does appear swollen, but does not protrude from mouth while closed.  Pt is A&O x4.

## 2022-08-31 NOTE — ED Notes (Signed)
ED TO INPATIENT HANDOFF REPORT  ED Nurse Name and Phone #: Manus Gunning, RN   S Name/Age/Gender Jessica Wall 77 y.o. female Room/Bed: ED02A/ED02A  Code Status   Code Status: Full Code  Home/SNF/Other Home Patient oriented to: self, place, time, and situation Is this baseline? Yes   Triage Complete: Triage complete  Chief Complaint Angioedema due to angiotensin converting enzyme inhibitor (ACE-I) [T78.3XXA, T46.4X5A]  Triage Note Pt presents to ER with c/o tongue swelling that started a couple of hours ago.  Pt states she has not had any new foods recently, and has not been started on any new meds.  Pt does take lisinopril, and states she last took it yesterday PM.  Pt able to speak in full sentences, with no distress noted.  Tongue does appear swollen, but does not protrude from mouth while closed.  Pt is A&O x4.     Allergies Allergies  Allergen Reactions   Aspirin Swelling    Level of Care/Admitting Diagnosis ED Disposition     ED Disposition  Admit   Condition  --   Comment  Hospital [facility]: [facility] [[id number]]  Level of Care: ICU [6]  Covid Evaluation: Asymptomatic - no recent exposure (last 10 days) testing not required  Diagnosis: Angioedema due to angiotensin converting enzyme inhibitor (ACE-I) [[id number]]  Admitting Physician: Vida Rigger [[id number]]  Attending Physician: Tawni Carnes  Certification:: I certify this patient will need inpatient services for at least 2 midnights  Estimated Length of Stay: 2          B Medical/Surgery History Past Medical History:  Diagnosis Date   GERD (gastroesophageal reflux disease)    Goiter    s/p thyroidectomy   H/O syncope    Hypercholesterolemia    Hypothyroidism    Recurrent sinus infections    and allergy problems   Past Surgical History:  Procedure Laterality Date   ARTERY BIOPSY Right 03/13/2021   Procedure: BIOPSY TEMPORAL ARTERY;  Surgeon: Earline Mayotte, MD;   Location: [facility] ORS;  Service: General;  Laterality: Right;   BREAST BIOPSY     COLONOSCOPY WITH PROPOFOL N/A 11/03/2014   Procedure: COLONOSCOPY WITH PROPOFOL;  Surgeon: Scot Jun, MD;  Location: [facility] ENDOSCOPY;  Service: Endoscopy;  Laterality: N/A;   COLONOSCOPY WITH PROPOFOL N/A 02/12/2018   Procedure: COLONOSCOPY WITH PROPOFOL;  Surgeon: Scot Jun, MD;  Location: [facility] ENDOSCOPY;  Service: Endoscopy;  Laterality: N/A;   DILATION AND CURETTAGE OF UTERUS  1980 & 1993   ESOPHAGOGASTRODUODENOSCOPY N/A 11/03/2014   Procedure: ESOPHAGOGASTRODUODENOSCOPY (EGD);  Surgeon: Scot Jun, MD;  Location: [facility] ENDOSCOPY;  Service: Endoscopy;  Laterality: N/A;   LAPAROSCOPIC APPENDECTOMY N/A 11/26/2014   Procedure: APPENDECTOMY LAPAROSCOPIC;  Surgeon: Earline Mayotte, MD;  Location: [facility] ORS;  Service: General;  Laterality: N/A;   thyroidectomy  1979   goiter   TUBAL LIGATION  1975     A IV Location/Drains/Wounds Patient Lines/Drains/Airways Status     Active Line/Drains/Airways     Name Placement date Placement time Site Days   Peripheral IV 08/31/22 20 G Anterior;Left;Proximal Antecubital 08/31/22  2155  Antecubital  less than 1            Intake/Output Last 24 hours  Intake/Output Summary (Last 24 hours) at 08/31/2022 2300 Last data filed at 08/31/2022 2219 Gross per 24 hour  Intake --  Output 0 ml  Net 0 ml    Labs/Imaging No results found for this or any previous  visit (from the past 48 hour(s)). No results found.  Pending Labs Unresulted Labs (From admission, onward)     Start     Ordered   09/01/22 0500  Phosphorus  Tomorrow morning,   R        08/31/22 2249   09/01/22 0500  CBC  Tomorrow morning,   R        08/31/22 2249   09/01/22 0500  Basic metabolic panel  Tomorrow morning,   R        08/31/22 2249   08/31/22 2246  Comprehensive metabolic panel  Once,   STAT        08/31/22 2245   08/31/22 2246  CBC with Differential  Once,   STAT         08/31/22 2245            Vitals/Pain Today's Vitals   08/31/22 2147 08/31/22 2148 08/31/22 2202 08/31/22 2230  BP: 109/74  (!) 117/56 (!) 110/51  Pulse: 68  67 65  Resp: 17   15  Temp: 98.5 F (36.9 C)     TempSrc: Oral     SpO2: 100%  99% 100%  PainSc:  0-No pain      Isolation Precautions No active isolations  Medications Medications  EPINEPHrine (EPI-PEN) 0.3 mg/0.3 mL injection (  Not Given 08/31/22 2220)  diphenhydrAMINE (BENADRYL) 50 MG/ML injection (  Not Given 08/31/22 2221)  docusate sodium (COLACE) capsule 100 mg (has no administration in time range)  polyethylene glycol (MIRALAX / GLYCOLAX) packet 17 g (has no administration in time range)  enoxaparin (LOVENOX) injection 40 mg (has no administration in time range)  dexamethasone (DECADRON) injection 10 mg (10 mg Intravenous Given 08/31/22 2212)  famotidine (PEPCID) IVPB 20 mg premix (0 mg Intravenous Stopped 08/31/22 2247)  lidocaine (XYLOCAINE) 2 % viscous mouth solution 15 mL (15 mLs Mouth/Throat Given 08/31/22 2215)  oxymetazoline (AFRIN) 0.05 % nasal spray 1 spray (1 spray Each Nare Given 08/31/22 2213)  diphenhydrAMINE (BENADRYL) injection 25 mg (25 mg Intravenous Given 08/31/22 2236)    Mobility walks     Focused Assessments Airway management   R Recommendations: See Admitting Provider Note  Report given to:   Additional Notes: 37F from home. Came in with 3 hour of tongue and subjective throat swelling prior to arrival. Takes Lisinopril long term but started having swelling sx today. Last dose was last night. Moderate tongue swelling. Used a rhinolaryngeal scope to visualize back of airway and it was good. Most swelling in the front of tongue. Gave decadron, pepcid, benadryl to see if any of it helps. She is stable and has not had any changes. A/ox4. Ambulatory. Has husband at bedside.   20L ac

## 2022-08-31 NOTE — H&P (Incomplete)
NAME:  Jessica Wall, MRN:  [id number], DOB:  12-26-1945, LOS: 0 ADMISSION DATE:  08/31/2022, CONSULTATION DATE:  *** REFERRING MD:  ***, CHIEF COMPLAINT:  ***    HPI  77 y.o  female with significant PMH of HTN on Lisinopril, HLD, Hypothyroidism, Giant Cell Arteritis, steroid induced hyperglycemia,  chronic left shoulder pain who presented to the ED with chief complaints of sudden onset tongue swelling, difficulty handling secretions,    ED Course: Initial vital signs showed HR of beats/minute, BP mm Hg, the RR 30 breaths/minute, and the oxygen saturation % on and a temperature of 98.42F (36.9C).   Pertinent Labs/Diagnostics Findings: Chemistry:Na+/ K+:  Glucose: BUN/Cr.:Calcium:  AST/ALT: CBC: WBC: Hgb/Hct: Plts:  Other Lab findings:   PCT: negative <0.10 Lactic acid:  COVID PCR: Negative, Troponin:  BNP:   Arterial Blood Gas result:  pO2 ***; pCO2 ***; pH ***;  HCO3 ***, %O2 Sat ***.  Imaging:  CXR> CTH> CTA Chest> CT Abd/pelvis> Medications Administered:   Past Medical History  ***  Significant Hospital Events   ***  Consults:  ***  Procedures:  ***  Significant Diagnostic Tests:  : Chest Xray>  Interim History / Subjective:    Micro Data:  None  Antimicrobials:  None  OBJECTIVE  Blood pressure (!) 110/51, pulse 65, temperature 98.5 F (36.9 C), temperature source Oral, resp. rate 15, SpO2 100 %.        Intake/Output Summary (Last 24 hours) at 08/31/2022 2302 Last data filed at 08/31/2022 2219 Gross per 24 hour  Intake --  Output 0 ml  Net 0 ml   There were no vitals filed for this visit.   Physical Examination  GENERAL:  year-old critically ill patient lying in the bed intubated and sedated EYES: PEERLA. No scleral icterus. Extraocular muscles intact.  HEENT: Head atraumatic, normocephalic. Oropharynx and nasopharynx clear.  NECK:  No JVD, supple  LUNGS: Normal breath sounds bilaterally.  No use of accessory muscles of respiration.   CARDIOVASCULAR: S1, S2 normal. No murmurs, rubs, or gallops.  ABDOMEN: Soft, NTND EXTREMITIES: No swelling or erythema.  Capillary refill is less than 3 seconds in all extremities. Pulses palpable distally. NEUROLOGIC: The patient is intubated and sedated. Cranial nerves are intact.  SKIN: No obvious rash, lesion, or ulcer. Warm to touch Labs/imaging that I {ACTIONS; HAVE/HAVE NOT:19434}personally reviewed  (right click and "Reselect all SmartList Selections" daily)     Labs   CBC: Recent Labs  Lab 08/31/22 2154  WBC 7.0  NEUTROABS 2.8  HGB 10.1*  HCT 31.7*  MCV 97.8  PLT 377    Basic Metabolic Panel: No results for input(s): "NA", "K", "CL", "CO2", "GLUCOSE", "BUN", "CREATININE", "CALCIUM", "MG", "PHOS" in the last 168 hours. GFR: CrCl cannot be calculated (Patient's most recent lab result is older than the maximum 21 days allowed.). Recent Labs  Lab 08/31/22 2154  WBC 7.0    Liver Function Tests: No results for input(s): "AST", "ALT", "ALKPHOS", "BILITOT", "PROT", "ALBUMIN" in the last 168 hours. No results for input(s): "LIPASE", "AMYLASE" in the last 168 hours. No results for input(s): "AMMONIA" in the last 168 hours.  ABG No results found for: "PHART", "PCO2ART", "PO2ART", "HCO3", "TCO2", "ACIDBASEDEF", "O2SAT"   Coagulation Profile: No results for input(s): "INR", "PROTIME" in the last 168 hours.  Cardiac Enzymes: No results for input(s): "CKTOTAL", "CKMB", "CKMBINDEX", "TROPONINI" in the last 168 hours.  HbA1C: Hemoglobin A1C  Date/Time Value Ref Range Status  11/29/2021 12:00 AM 6.6  Final  Hgb A1c MFr Bld  Date/Time Value Ref Range Status  08/17/2021 10:14 AM 5.7 4.6 - 6.5 % Final    Comment:    Glycemic Control Guidelines for People with Diabetes:Non Diabetic:  <6%Goal of Therapy: <7%Additional Action Suggested:  >8%     CBG: No results for input(s): "GLUCAP" in the last 168 hours.  Review of Systems:   ***  Past Medical History  She,  has  a past medical history of GERD (gastroesophageal reflux disease), Goiter, H/O syncope, Hypercholesterolemia, Hypothyroidism, and Recurrent sinus infections.   Surgical History    Past Surgical History:  Procedure Laterality Date  . ARTERY BIOPSY Right 03/13/2021   Procedure: BIOPSY TEMPORAL ARTERY;  Surgeon: Earline Mayotte, MD;  Location: [facility] ORS;  Service: General;  Laterality: Right;  . BREAST BIOPSY    . COLONOSCOPY WITH PROPOFOL N/A 11/03/2014   Procedure: COLONOSCOPY WITH PROPOFOL;  Surgeon: Scot Jun, MD;  Location: [facility] ENDOSCOPY;  Service: Endoscopy;  Laterality: N/A;  . COLONOSCOPY WITH PROPOFOL N/A 02/12/2018   Procedure: COLONOSCOPY WITH PROPOFOL;  Surgeon: Scot Jun, MD;  Location: [facility] ENDOSCOPY;  Service: Endoscopy;  Laterality: N/A;  . DILATION AND CURETTAGE OF UTERUS  1980 & 1993  . ESOPHAGOGASTRODUODENOSCOPY N/A 11/03/2014   Procedure: ESOPHAGOGASTRODUODENOSCOPY (EGD);  Surgeon: Scot Jun, MD;  Location: [facility] ENDOSCOPY;  Service: Endoscopy;  Laterality: N/A;  . LAPAROSCOPIC APPENDECTOMY N/A 11/26/2014   Procedure: APPENDECTOMY LAPAROSCOPIC;  Surgeon: Earline Mayotte, MD;  Location: [facility] ORS;  Service: General;  Laterality: N/A;  . thyroidectomy  1979   goiter  . TUBAL LIGATION  1975     Social History   reports that she quit smoking about 13 years ago. Her smoking use included cigarettes. She has never used smokeless tobacco. She reports current alcohol use of about 3.0 - 4.0 standard drinks of alcohol per week. She reports current drug use. Drug: Marijuana.   Family History   Her family history includes Alzheimer's disease in her mother; Cancer in her brother, brother, brother, and brother; Goiter in her mother; Tuberculosis in her paternal grandfather.   Allergies Allergies  Allergen Reactions  . Aspirin Swelling     Home Medications  Prior to Admission medications   Medication Sig Start Date End Date Taking? Authorizing Provider   amLODipine (NORVASC) 2.5 MG tablet Take 2.5 mg by mouth daily.    [provider]  cyanocobalamin 1000 MCG tablet Take 1,000 mcg by mouth daily.    [provider]  ferrous sulfate 325 (65 FE) MG tablet Take 325 mg by mouth daily with breakfast.    [provider]  levothyroxine (SYNTHROID) 50 MCG tablet Take 1 tablet (50 mcg total) by mouth daily. 08/14/22   Dale Osgood, MD  lisinopril (ZESTRIL) 40 MG tablet Take 1 tablet (40 mg total) by mouth daily. 08/14/22   Dale [place], MD  rosuvastatin (CRESTOR) 10 MG tablet TAKE 1 TABLET BY MOUTH EVERY DAY 02/28/22   Dale Howard, MD      Active Hospital Problem list   ***  Assessment & Plan:  ***  Best practice:  Diet:  {ZOXW:96045} Pain/Anxiety/Delirium protocol (if indicated): {Pain/Anxiety/Delirium:26941} VAP protocol (if indicated): {VAP:29640} DVT prophylaxis: {DVT Prophylaxis:26933} GI prophylaxis: {GI:26934} Glucose control:  {Glucose Control:26935} Central venous access:  {Central Venous Access:26936} Arterial line:  {Central Venous Access:26936} Foley:  {Central Venous Access:26936} Mobility:  {Mobility:26937}  PT consulted: {PT Consult:26938} Last date of multidisciplinary goals of care discussion [***] Code Status:  {Code Status:26939} Disposition: ***   =  Goals of Care = Code Status Order: @CODE @   Primary Emergency Contact: Flo, [place], Home Phone: [phone] Wishes to pursue full aggressive treatment and intervention options, including CPR and intubation, but goals of care will be addressed on going with family if that should become necessary.   Critical care time: 45 minutes        Webb Silversmith DNP, CCRN, FNP-C, AGACNP-BC Acute Care & Family Nurse Practitioner [facility] P[facility]M on call pager [phone]

## 2022-08-31 NOTE — H&P (Signed)
NAME:  Jessica Wall, MRN:  [id number], DOB:  [birth year], LOS: 0 ADMISSION DATE:  08/31/2022, CONSULTATION DATE:  09/01/2022 REFERRING MD:  Susette Racer, Teddy Spike  CHIEF COMPLAINT:  Angioedema   HPI  78 y.o  female with significant PMH of HTN on Lisinopril, HLD, Hypothyroidism, Giant Cell Arteritis, steroid induced hyperglycemia,  chronic left shoulder pain who presented to the ED with chief complaints of sudden onset tongue swelling.  Patient report onset of symptoms since 3 pm and has been progressive with associated difficulty handling secretions and speaking. Denies urticaria, flushing, pruritis; (Respiratory symptoms such as  dyspnea, wheeze, upper airway angioedema, rhinitis; nausea, vomiting, diarrhea, dizziness, or palpitations.   ED Course: Initial vital signs showed HR of beats/minute, BP mm Hg, the RR 30 breaths/minute, and the oxygen saturation % on and a temperature of 98.64F (36.9C).   Pertinent Labs/Diagnostics Findings: Na+/ K+:138/3.9  Glucose: 102 BUN/Cr.:22/1.15  WBC:7 Hgb/Hct:10.1/31.7  Medications Administered: Dexamethasone 10 mg IV, Benadryl 25 mg IV, Famotidine 20 mg IV  PCCM consulted due to high risk for intubation  Past Medical History  HTN on Lisinopril, HLD, Hypothyroidism, Giant Cell Arteritis, steroid induced hyperglycemia,  chronic left shoulder pain   Significant Hospital Events   5/17: Admitted to ICU with suspected ACE Inhibitor Induced Angioedema.  Consults:  None  Procedures:  None  Significant Diagnostic Tests:  None  Interim History / Subjective:  -Patient maintaining patent airway  Micro Data:  None  Antimicrobials:  None  OBJECTIVE  Blood pressure (!) 110/51, pulse 65, temperature 98.5 F (36.9 C), temperature source Oral, resp. rate 15, SpO2 100 %.        Intake/Output Summary (Last 24 hours) at 08/31/2022 2302 Last data filed at 08/31/2022 2219 Gross per 24 hour  Intake --  Output 0 ml  Net 0 ml   There were no vitals  filed for this visit.  Physical Examination  GENERAL:77 year-old critically ill patient lying in the bed in no acute distress EYES: PEERLA. No scleral icterus. Extraocular muscles intact.  HEENT: Head atraumatic, normocephalic. Oropharynx and nasopharynx clear.  NECK:  No JVD, supple  LUNGS: Normal breath sounds bilaterally.  No use of accessory muscles of respiration.  CARDIOVASCULAR: S1, S2 normal. No murmurs, rubs, or gallops.  ABDOMEN: Soft, NTND EXTREMITIES: No swelling or erythema. Pulses palpable distally. NEUROLOGIC: The patient is awake, alert and oriented x 4. No focal neurological deficit. Cranial nerves are intact.  SKIN: No obvious rash, lesion, or ulcer. Warm to touch Labs/imaging that I havepersonally reviewed  (right click and "Reselect all SmartList Selections" daily)     Labs   CBC: Recent Labs  Lab 08/31/22 2154  WBC 7.0  NEUTROABS 2.8  HGB 10.1*  HCT 31.7*  MCV 97.8  PLT 377    Basic Metabolic Panel: No results for input(s): "NA", "K", "CL", "CO2", "GLUCOSE", "BUN", "CREATININE", "CALCIUM", "MG", "PHOS" in the last 168 hours. GFR: CrCl cannot be calculated (Patient's most recent lab result is older than the maximum 21 days allowed.). Recent Labs  Lab 08/31/22 2154  WBC 7.0    Liver Function Tests: No results for input(s): "AST", "ALT", "ALKPHOS", "BILITOT", "PROT", "ALBUMIN" in the last 168 hours. No results for input(s): "LIPASE", "AMYLASE" in the last 168 hours. No results for input(s): "AMMONIA" in the last 168 hours.  ABG No results found for: "PHART", "PCO2ART", "PO2ART", "HCO3", "TCO2", "ACIDBASEDEF", "O2SAT"   Coagulation Profile: No results for input(s): "INR", "PROTIME" in the last 168 hours.  Cardiac Enzymes: No results for input(s): "CKTOTAL", "CKMB", "CKMBINDEX", "TROPONINI" in the last 168 hours.  HbA1C: Hemoglobin A1C  Date/Time Value Ref Range Status  11/29/2021 12:00 AM 6.6  Final   Hgb A1c MFr Bld  Date/Time Value Ref  Range Status  08/17/2021 10:14 AM 5.7 4.6 - 6.5 % Final    Comment:    Glycemic Control Guidelines for People with Diabetes:Non Diabetic:  <6%Goal of Therapy: <7%Additional Action Suggested:  >8%     CBG: No results for input(s): "GLUCAP" in the last 168 hours.  Review of Systems:   Review of Systems  Constitutional: Negative.   HENT:  Positive for sore throat. Negative for congestion, ear discharge, ear pain, hearing loss, nosebleeds, sinus pain and tinnitus.   Eyes: Negative.   Respiratory:  Positive for cough. Negative for hemoptysis, sputum production, shortness of breath, wheezing and stridor.   Cardiovascular:  Negative for chest pain, palpitations, orthopnea, claudication, leg swelling and PND.  Gastrointestinal: Negative.   Genitourinary: Negative.   Musculoskeletal:  Positive for joint pain.  Skin: Negative.   Neurological: Negative.   Psychiatric/Behavioral: Negative.     Past Medical History  She,  has a past medical history of GERD (gastroesophageal reflux disease), Goiter, H/O syncope, Hypercholesterolemia, Hypothyroidism, and Recurrent sinus infections.   Surgical History    Past Surgical History:  Procedure Laterality Date   ARTERY BIOPSY Right 03/13/2021   Procedure: BIOPSY TEMPORAL ARTERY;  Surgeon: Earline Mayotte, MD;  Location: [facility] ORS;  Service: General;  Laterality: Right;   BREAST BIOPSY     COLONOSCOPY WITH PROPOFOL N/A 11/03/2014   Procedure: COLONOSCOPY WITH PROPOFOL;  Surgeon: Scot Jun, MD;  Location: [facility] ENDOSCOPY;  Service: Endoscopy;  Laterality: N/A;   COLONOSCOPY WITH PROPOFOL N/A 02/12/2018   Procedure: COLONOSCOPY WITH PROPOFOL;  Surgeon: Scot Jun, MD;  Location: [facility] ENDOSCOPY;  Service: Endoscopy;  Laterality: N/A;   DILATION AND CURETTAGE OF UTERUS  1980 & 1993   ESOPHAGOGASTRODUODENOSCOPY N/A 11/03/2014   Procedure: ESOPHAGOGASTRODUODENOSCOPY (EGD);  Surgeon: Scot Jun, MD;  Location: [facility] ENDOSCOPY;  Service:  Endoscopy;  Laterality: N/A;   LAPAROSCOPIC APPENDECTOMY N/A 11/26/2014   Procedure: APPENDECTOMY LAPAROSCOPIC;  Surgeon: Earline Mayotte, MD;  Location: [facility] ORS;  Service: General;  Laterality: N/A;   thyroidectomy  1979   goiter   TUBAL LIGATION  1975     Social History   reports that she quit smoking about 13 years ago. Her smoking use included cigarettes. She has never used smokeless tobacco. She reports current alcohol use of about 3.0 - 4.0 standard drinks of alcohol per week. She reports current drug use. Drug: Marijuana.   Family History   Her family history includes Alzheimer's disease in her mother; Cancer in her brother, brother, brother, and brother; Goiter in her mother; Tuberculosis in her paternal grandfather.   Allergies Allergies  Allergen Reactions   Aspirin Swelling     Home Medications  Prior to Admission medications   Medication Sig Start Date End Date Taking? Authorizing Provider  amLODipine (NORVASC) 2.5 MG tablet Take 2.5 mg by mouth daily.    [provider]  cyanocobalamin 1000 MCG tablet Take 1,000 mcg by mouth daily.    [provider]  ferrous sulfate 325 (65 FE) MG tablet Take 325 mg by mouth daily with breakfast.    [provider]  levothyroxine (SYNTHROID) 50 MCG tablet Take 1 tablet (50 mcg total) by mouth daily. 08/14/22   Dale [place], MD  lisinopril (ZESTRIL) 40 MG tablet Take 1 tablet (40 mg total) by mouth daily. 08/14/22   Dale [place], MD  rosuvastatin (CRESTOR) 10 MG tablet TAKE 1 TABLET BY MOUTH EVERY DAY 02/28/22   Dale [place], MD  Scheduled Meds:  diphenhydrAMINE       enoxaparin (LOVENOX) injection  40 mg Subcutaneous Q24H   EPINEPHrine       Continuous Infusions:  sodium chloride     PRN Meds:.diphenhydrAMINE, docusate sodium, EPINEPHrine, polyethylene glycol   Active Hospital Problem list   See below  Assessment & Plan:  #Angioedema likely due to ACE inhibitor use  Initial treatment with  Dexamethasone 10 mg , Famotidine 20 mg IV and Diphenhydramine 25 mg IV -Monitor airway and cont pulse ox in ICU  -High risk for intubation -Avoid ACE inhibitor, listed in the allergy list -Continue Solu-Medrol, famotidine and Benadryl -Check C1 esterase inhibitor, C4, CRP and ESR, thyroid panel -Will administer C1 esterase inhibitor x 1 dose -IVFs maintain MAP> 65 -NPO advance to Clear liquids as tolerated   #AKI on CKD? -Trend Lactate -Monitor I&O's / urinary output -Follow BMP -Ensure adequate renal perfusion -Avoid nephrotoxic agents as able -Replace electrolytes as indicated  #HTN  #HLD -Discontinue Lisinopril as above -Continue Amlodipine once able to take po -Follow up with PCP for BP management -Continue Rosuvastatin 10 mg daily  #Hyperglycemia Likely steroid induced -CBG's q4; Target range of 140 to 180 -SSI -Follow ICU Hypo/Hyperglycemia protocol   #Giant Cell Arteritis per Biopsy  -stable -Initially treated with Prednisone 60 mg -Methotrexate caused elevated LFTs  -Off Prednisone  -Follows with Rheumatologist  #Hypothyroidism -Check TSH and Free T4 -Continue Synthroid   Best practice:  Diet:  NPO Pain/Anxiety/Delirium protocol (if indicated): No VAP protocol (if indicated): Not indicated DVT prophylaxis: LMWH GI prophylaxis: H2B Glucose control:  SSI Yes Central venous access:  N/A Arterial line:  N/A Foley:  N/A Mobility:  bed rest  PT consulted: N/A Last date of multidisciplinary goals of care discussion [09/01/2022] Code Status:  full code Disposition: ICU   = Goals of Care = Code Status Order: FULL  Primary Emergency Contact: Hashemi,Tony Elbert Ewings, Home Phone: [phone] Wishes to pursue full aggressive treatment and intervention options, including CPR and intubation, but goals of care will be addressed on going with family if that should become necessary.   Critical care time: 45 minutes        Webb Silversmith DNP, CCRN, FNP-C,  AGACNP-BC Acute Care & Family Nurse Practitioner [facility] PCCM on call pager [phone]

## 2022-09-01 ENCOUNTER — Inpatient Hospital Stay: Payer: No Typology Code available for payment source

## 2022-09-01 DIAGNOSIS — T783XXA Angioneurotic edema, initial encounter: Secondary | ICD-10-CM | POA: Diagnosis not present

## 2022-09-01 DIAGNOSIS — T464X5A Adverse effect of angiotensin-converting-enzyme inhibitors, initial encounter: Secondary | ICD-10-CM | POA: Diagnosis not present

## 2022-09-01 DIAGNOSIS — R918 Other nonspecific abnormal finding of lung field: Secondary | ICD-10-CM | POA: Diagnosis not present

## 2022-09-01 LAB — CBC
HCT: 31.6 % — ABNORMAL LOW (ref 36.0–46.0)
Hemoglobin: 10.6 g/dL — ABNORMAL LOW (ref 12.0–15.0)
MCH: 31.6 pg (ref 26.0–34.0)
MCHC: 33.5 g/dL (ref 30.0–36.0)
MCV: 94.3 fL (ref 80.0–100.0)
Platelets: 386 10*3/uL (ref 150–400)
RBC: 3.35 MIL/uL — ABNORMAL LOW (ref 3.87–5.11)
RDW: 13.3 % (ref 11.5–15.5)
WBC: 4.8 10*3/uL (ref 4.0–10.5)
nRBC: 0 % (ref 0.0–0.2)

## 2022-09-01 LAB — RESPIRATORY PANEL BY PCR

## 2022-09-01 LAB — BASIC METABOLIC PANEL
Anion gap: 7 (ref 5–15)
BUN: 20 mg/dL (ref 8–23)
CO2: 25 mmol/L (ref 22–32)
Calcium: 9.1 mg/dL (ref 8.9–10.3)
Chloride: 108 mmol/L (ref 98–111)
Creatinine, Ser: 1.04 mg/dL — ABNORMAL HIGH (ref 0.44–1.00)
GFR, Estimated: 56 mL/min — ABNORMAL LOW (ref >60)
Glucose, Bld: 153 mg/dL — ABNORMAL HIGH (ref 70–99)
Potassium: 4.6 mmol/L (ref 3.5–5.1)
Sodium: 140 mmol/L (ref 135–145)

## 2022-09-01 LAB — C-REACTIVE PROTEIN: CRP: 4.3 mg/dL — ABNORMAL HIGH (ref <1.0)

## 2022-09-01 LAB — T4, FREE: Free T4: 0.84 ng/dL (ref 0.61–1.12)

## 2022-09-01 LAB — PHOSPHORUS: Phosphorus: 3.8 mg/dL (ref 2.5–4.6)

## 2022-09-01 LAB — TSH: TSH: 1.499 u[IU]/mL (ref 0.350–4.500)

## 2022-09-01 LAB — MRSA NEXT GEN BY PCR, NASAL: MRSA by PCR Next Gen: NOT DETECTED

## 2022-09-01 MED ORDER — DIPHENHYDRAMINE HCL 50 MG/ML IJ SOLN
25.0000 mg | Freq: Three times a day (TID) | INTRAMUSCULAR | Status: DC
  Administered 2022-09-01: 25 mg via INTRAVENOUS
  Filled 2022-09-01: qty 1

## 2022-09-01 MED ORDER — DEXAMETHASONE SODIUM PHOSPHATE 10 MG/ML IJ SOLN
10.0000 mg | Freq: Three times a day (TID) | INTRAMUSCULAR | Status: DC
  Administered 2022-09-01 (×2): 10 mg via INTRAVENOUS
  Filled 2022-09-01 (×2): qty 1

## 2022-09-01 MED ORDER — METHYLPREDNISOLONE 4 MG PO TBPK: ORAL_TABLET | ORAL | 0 refills | Status: DC

## 2022-09-01 MED ORDER — FAMOTIDINE IN NACL 20-0.9 MG/50ML-% IV SOLN
20.0000 mg | INTRAVENOUS | Status: DC
  Administered 2022-09-01: 20 mg via INTRAVENOUS
  Filled 2022-09-01: qty 50

## 2022-09-01 NOTE — ED Notes (Signed)
Pt sts subjective improvement in symptoms from time of arrival.

## 2022-09-01 NOTE — Discharge Summary (Signed)
Physician Discharge Summary         Patient ID: NALEE KLAPP MRN: [id number] DOB/AGE: 16-Jan-1946 77 y.o.  Admit date: 08/31/2022 Discharge date: 09/01/2022  Discharge Diagnoses:    Active Hospital Problems   Diagnosis Date Noted   Angioedema due to angiotensin converting enzyme inhibitor (ACE-I) 08/31/2022    Resolved Hospital Problems  No resolved problems to display.      Discharge summary    77 y.o  female with significant PMH of HTN on Lisinopril, HLD, Hypothyroidism, Giant Cell Arteritis, steroid induced hyperglycemia,  chronic left shoulder pain who presented to the ED with chief complaints of sudden onset tongue swelling.   Patient report onset of symptoms since 3 pm and has been progressive with associated difficulty handling secretions and speaking. Denies urticaria, flushing, pruritis; (Respiratory symptoms such as  dyspnea, wheeze, upper airway angioedema, rhinitis; nausea, vomiting, diarrhea, dizziness, or palpitations.   ED Course: Initial vital signs showed HR of beats/minute, BP mm Hg, the RR 30 breaths/minute, and the oxygen saturation % on and a temperature of 98.16F (36.9C).    Pertinent Labs/Diagnostics Findings: Na+/ K+:138/3.9  Glucose: 102 BUN/Cr.:22/1.15  WBC:7 Hgb/Hct:10.1/31.7  Medications Administered: Dexamethasone 10 mg IV, Benadryl 25 mg IV, Famotidine 20 mg IV   PCCM consulted due to high risk for intubation.  Patient continued to improve overnight and in am appears in chronic stable state.   Discharge Plan by Active Problems    Angioedema: - dc lisinopril.  Use amlodipine 5mg  po daily    Significant Hospital tests/ studies   Several blood tests all normal. PT/OT eval normal, SLP normal, imaging normal, viral studies normal  Procedures    Speech and swallow studies  Culture data/antimicrobials      Consults    Rheumatology on outpatient  Discharge Exam: BP 125/79   Pulse 78   Temp 98.3 F (36.8 C) (Oral)   Resp 17   Ht  5\' 3"  (1.6 m)   Wt 52.9 kg   SpO2 100%   BMI 20.66 kg/m    Labs at discharge   Lab Results  Component Value Date   CREATININE 1.04 (H) 09/01/2022   BUN 20 09/01/2022   NA 140 09/01/2022   K 4.6 09/01/2022   CL 108 09/01/2022   CO2 25 09/01/2022   Lab Results  Component Value Date   WBC 4.8 09/01/2022   HGB 10.6 (L) 09/01/2022   HCT 31.6 (L) 09/01/2022   MCV 94.3 09/01/2022   PLT 386 09/01/2022   Lab Results  Component Value Date   ALT 9 08/31/2022   AST 18 08/31/2022   ALKPHOS 70 08/31/2022   BILITOT 0.4 08/31/2022   Lab Results  Component Value Date   INR 1.2 06/07/2021    Current radiological studies    DG Chest Port 1 View  Result Date: 09/01/2022 CLINICAL DATA:  77 year old female with history of pulmonary infiltrates noted on chest x-ray. EXAM: PORTABLE CHEST 1 VIEW COMPARISON:  Chest x-ray 06/07/2021. FINDINGS: Lung volumes are normal. No consolidative airspace disease. No pleural effusions. No pneumothorax. No pulmonary nodule or mass noted. Pulmonary vasculature and the cardiomediastinal silhouette are within normal limits. IMPRESSION: No radiographic evidence of acute cardiopulmonary disease. Electronically Signed   By: Trudie Reed M.D.   On: 09/01/2022 09:24    Disposition:   Home with husband. I have asked patient to stay and signed her out to hospitalist team in am for additional monitoring overnight.  Patient declines to stay and requests  discharge home asap today straight from ICU.    Follow-up appointment   Dr Allena Katz in 2 wk Discharge Condition:    good   Signed: Vida Rigger 09/01/2022, 4:15 PM

## 2022-09-01 NOTE — Evaluation (Signed)
Physical Therapy Evaluation Patient Details Name: Jessica Wall MRN: [id number] DOB: June 09, 1945 Today's Date: 09/01/2022  History of Present Illness  77 y.o  female with significant PMH of HTN on Lisinopril, HLD, Hypothyroidism, Giant Cell Arteritis, steroid induced hyperglycemia,  chronic left shoulder pain who presented to the ED with chief complaints of sudden onset tongue swelling.  Clinical Impression  Patient received in bed, she is talking on phone. Reports she has not been out of bed except for to use bedside toilet. Patient is independent with bed mobility and transfers. Performed all self care independently. She ambulated 300 feet without AD and supervision only. She does not require skilled PT follow up at this time. Signing off.         Recommendations for follow up therapy are one component of a multi-disciplinary discharge planning process, led by the attending physician.  Recommendations may be updated based on patient status, additional functional criteria and insurance authorization.  Follow Up Recommendations       Assistance Recommended at Discharge None  Patient can return home with the following       Equipment Recommendations None recommended by PT  Recommendations for Other Services       Functional Status Assessment Patient has not had a recent decline in their functional status     Precautions / Restrictions Precautions Precautions: None Restrictions Weight Bearing Restrictions: No      Mobility  Bed Mobility Overal bed mobility: Independent                  Transfers Overall transfer level: Independent Equipment used: None                    Ambulation/Gait Ambulation/Gait assistance: Independent Gait Distance (Feet): 300 Feet Assistive device: None Gait Pattern/deviations: WFL(Within Functional Limits)          Stairs            Wheelchair Mobility    Modified Rankin (Stroke Patients Only)       Balance  Overall balance assessment: Independent                                           Pertinent Vitals/Pain Pain Assessment Pain Assessment: No/denies pain    Home Living Family/patient expects to be discharged to:: Private residence Living Arrangements: Spouse/significant other Available Help at Discharge: Family;Available 24 hours/day Type of Home: House Home Access: Level entry       Home Layout: One level Home Equipment: None      Prior Function Prior Level of Function : Independent/Modified Independent             Mobility Comments: works at [employer]        Extremity/Trunk Assessment   Upper Extremity Assessment Upper Extremity Assessment: Overall WFL for tasks assessed    Lower Extremity Assessment Lower Extremity Assessment: Overall WFL for tasks assessed    Cervical / Trunk Assessment Cervical / Trunk Assessment: Normal  Communication   Communication: No difficulties  Cognition Arousal/Alertness: Awake/alert Behavior During Therapy: WFL for tasks assessed/performed Overall Cognitive Status: Within Functional Limits for tasks assessed  General Comments      Exercises     Assessment/Plan    PT Assessment Patient does not need any further PT services  PT Problem List         PT Treatment Interventions      PT Goals (Current goals can be found in the Care Plan section)  Acute Rehab PT Goals Patient Stated Goal: to return home PT Goal Formulation: With patient Time For Goal Achievement: 09/08/22 Potential to Achieve Goals: Good    Frequency       Co-evaluation PT/OT/SLP Co-Evaluation/Treatment: Yes Reason for Co-Treatment: For patient/therapist safety;To address functional/ADL transfers PT goals addressed during session: Mobility/safety with mobility OT goals addressed during session: ADL's and self-care       AM-PAC PT "6 Clicks"  Mobility  Outcome Measure Help needed turning from your back to your side while in a flat bed without using bedrails?: None Help needed moving from lying on your back to sitting on the side of a flat bed without using bedrails?: None Help needed moving to and from a bed to a chair (including a wheelchair)?: None Help needed standing up from a chair using your arms (e.g., wheelchair or bedside chair)?: None Help needed to walk in hospital room?: None Help needed climbing 3-5 steps with a railing? : None 6 Click Score: 24    End of Session   Activity Tolerance: Patient tolerated treatment well Patient left: in chair Nurse Communication: Mobility status      Time: 8119-1478 PT Time Calculation (min) (ACUTE ONLY): 12 min   Charges:   PT Evaluation $PT Eval Low Complexity: 1 Low          Lynita Groseclose, PT, GCS 09/01/22,2:34 PM

## 2022-09-01 NOTE — Progress Notes (Signed)
eLink Physician-Brief Progress Note Patient Name: DAJON HUTCHERSON DOB: February 18, 1946 MRN: [id number]   Date of Service  09/01/2022  HPI/Events of Note  Patient on Lisinopril admitted with angioedema.  eICU Interventions  New Patient Evaluation.        Leilana Mcquire U Brodi Kari 09/01/2022, 1:30 AM

## 2022-09-01 NOTE — Plan of Care (Signed)

## 2022-09-01 NOTE — Care Management CC44 (Signed)
Condition Code 44 Documentation Completed  Patient Details  Name: Jessica Wall MRN: [id number] Date of Birth: 1945-09-30   Condition Code 44 given:  Yes Patient signature on Condition Code 44 notice:  Yes Documentation of 2 MD's agreement:  Yes Code 44 added to claim:  Yes    Susa Simmonds, LCSWA 09/01/2022, 4:58 PM

## 2022-09-01 NOTE — Consult Note (Signed)
PHARMACY CONSULT NOTE - FOLLOW UP  Pharmacy Consult for Electrolyte Monitoring and Replacement   Recent Labs: Potassium (mmol/L)  Date Value  09/01/2022 4.6  09/27/2011 3.9   Magnesium (mg/dL)  Date Value  16/01/9603 1.8  09/27/2011 2.0   Calcium (mg/dL)  Date Value  54/12/8117 9.1   Calcium, Total (mg/dL)  Date Value  14/78/2956 8.8   Albumin (g/dL)  Date Value  21/30/8657 3.2 (L)  09/25/2011 3.8   Phosphorus (mg/dL)  Date Value  84/69/6295 3.8   Sodium  Date Value  09/01/2022 140 mmol/L  11/29/2021 140  09/27/2011 143 mmol/L     Assessment: 77 y.o  female with significant PMH of HTN on Lisinopril, HLD, Hypothyroidism, Giant Cell Arteritis, steroid induced hyperglycemia,  chronic left shoulder pain who presented to the ED with chief complaints of sudden onset tongue swelling. Pharmacy has been consulted to monitor and replaced electrolytes while under PCCM care.  Goal of Therapy:  Electrolytes WNL  Plan:  - no replacement currently indicated - will recheck electrolytes tomorrow with AM labs  Bettey Costa ,PharmD Clinical Pharmacist 09/01/2022 8:29 AM

## 2022-09-01 NOTE — Evaluation (Signed)
Clinical/Bedside Swallow Evaluation Patient Details  Name: Jessica Wall MRN: [id number] Date of Birth: [birth year]  Today's Date: 09/01/2022 Time: SLP Start Time (ACUTE ONLY): 1130 SLP Stop Time (ACUTE ONLY): 1230 SLP Time Calculation (min) (ACUTE ONLY): 60 min  Past Medical History:  Past Medical History:  Diagnosis Date   GERD (gastroesophageal reflux disease)    Goiter    s/p thyroidectomy   H/O syncope    Hypercholesterolemia    Hypothyroidism    Recurrent sinus infections    and allergy problems   Past Surgical History:  Past Surgical History:  Procedure Laterality Date   ARTERY BIOPSY Right 03/13/2021   Procedure: BIOPSY TEMPORAL ARTERY;  Surgeon: Earline Mayotte, MD;  Location: [facility] ORS;  Service: General;  Laterality: Right;   BREAST BIOPSY     COLONOSCOPY WITH PROPOFOL N/A 11/03/2014   Procedure: COLONOSCOPY WITH PROPOFOL;  Surgeon: Scot Jun, MD;  Location: [facility] ENDOSCOPY;  Service: Endoscopy;  Laterality: N/A;   COLONOSCOPY WITH PROPOFOL N/A 02/12/2018   Procedure: COLONOSCOPY WITH PROPOFOL;  Surgeon: Scot Jun, MD;  Location: [facility] ENDOSCOPY;  Service: Endoscopy;  Laterality: N/A;   DILATION AND CURETTAGE OF UTERUS  1980 & 1993   ESOPHAGOGASTRODUODENOSCOPY N/A 11/03/2014   Procedure: ESOPHAGOGASTRODUODENOSCOPY (EGD);  Surgeon: Scot Jun, MD;  Location: [facility] ENDOSCOPY;  Service: Endoscopy;  Laterality: N/A;   LAPAROSCOPIC APPENDECTOMY N/A 11/26/2014   Procedure: APPENDECTOMY LAPAROSCOPIC;  Surgeon: Earline Mayotte, MD;  Location: [facility] ORS;  Service: General;  Laterality: N/A;   thyroidectomy  1979   goiter   TUBAL LIGATION  1975   HPI:  Pt is a 77 y.o  female with significant PMH of HTN on Lisinopril, HLD, GERD, Hypothyroidism, Giant Cell Arteritis, steroid induced hyperglycemia,  chronic left shoulder pain who presented to the ED with chief complaints of sudden onset tongue swelling.     Patient report onset of symptoms since 3 pm and has  been progressive with associated difficulty handling secretions and speaking.  Admitting Dx: Angioedema likely due to ACE inhibitor use (lisinopril).  Pt is currently w/ no angioedema and feeling better per her report. NSG endorsed same.   MRI: no acute abnormality.  CXR: No radiographic evidence of acute cardiopulmonary disease.    Assessment / Plan / Recommendation  Clinical Impression   Pt seen today for BSE. Pt A/O x4, verbal and followed all directions. Asked good questions. Endorsed reduced angioedema of tongue. Speech Clear, Intelligible. Pt was eager for a meal but "just wanted to make sure" things were "ok" w/ this SLP. On RA, afebrile, WBC WNL.  Pt appears to present w/ functional oropharyngeal phase swallowing w/ No oropharyngeal phase dysphagia noted, No neuromuscular deficits noted. Pt consumed po trials w/ No overt, clinical s/s of aspiration during po trials.  Pt appears at reduced risk for aspiration following general aspiration precautions.   During po trials, pt consumed all consistencies w/ no overt coughing, decline in vocal quality, or change in respiratory presentation during/post trials. O2 sats remained 99%. Oral phase appeared [facility] w/ timely bolus management, mastication, and control of bolus propulsion for A-P transfer for swallowing. Oral clearing achieved w/ all trial consistencies -- moistened, soft foods given.  OM Exam appeared [facility] w/ no unilateral weakness noted. No dysarthria. Pt fed self independently. Lunch ordered w/ pt.   Recommend a Regular consistency diet w/ Cut meats, moistened foods; avoid Particulate foods(salads) for now if bothersome. Thin liquids. Recommend general aspiration and Reflux precautions. Pills WHOLE in  Puree IF needed for easier swallowing -- pt has been doing well w/ 1 pill at a time per pt/NSG.  Education given on Pills in Puree; food consistencies and easy to eat options and cohesive foods/moistened foods; general aspiration precautions to pt.  NSG/MD to reconsult if any new needs arise. NSG updated, agreed. MD updated. Recommend Dietician f/u for support. SLP Visit Diagnosis: Dysphagia, unspecified (R13.10)    Aspiration Risk  No limitations    Diet Recommendation   a Regular consistency diet w/ Cut meats, moistened foods; avoid Particulate foods(salads) for now if bothersome. Thin liquids. Recommend general aspiration and Reflux precautions.  Medication Administration: Whole meds with liquid    Other  Recommendations Recommended Consults:  (n/a) Oral Care Recommendations: Oral care BID;Patient independent with oral care    Recommendations for follow up therapy are one component of a multi-disciplinary discharge planning process, led by the attending physician.  Recommendations may be updated based on patient status, additional functional criteria and insurance authorization.  Follow up Recommendations No SLP follow up      Assistance Recommended at Discharge  PRN  Functional Status Assessment Patient has had a recent decline in their functional status and demonstrates the ability to make significant improvements in function in a reasonable and predictable amount of time.  Frequency and Duration  (n/a)   (n/a)       Prognosis Prognosis for improved oropharyngeal function: Good Barriers/Prognosis Comment: resolving angioedema of tongue d/t ACE inhibitor      Swallow Study   General Date of Onset: 08/31/22 HPI: Pt is a 77 y.o  female with significant PMH of HTN on Lisinopril, HLD, GERD, Hypothyroidism, Giant Cell Arteritis, steroid induced hyperglycemia,  chronic left shoulder pain who presented to the ED with chief complaints of sudden onset tongue swelling.     Patient report onset of symptoms since 3 pm and has been progressive with associated difficulty handling secretions and speaking.  Admitting Dx: Angioedema likely due to ACE inhibitor use (lisinopril).  Pt is currently w/ no angioedema and feeling better per her  report. NSG endorsed same.   MRI: no acute abnormality.  CXR: No radiographic evidence of acute cardiopulmonary disease. Type of Study: Bedside Swallow Evaluation Previous Swallow Assessment: none Diet Prior to this Study: NPO Temperature Spikes Noted: No (wbc 4.8) Respiratory Status: Room air History of Recent Intubation: No Behavior/Cognition: Alert;Cooperative;Pleasant mood Oral Cavity Assessment: Within Functional Limits Oral Care Completed by SLP: Yes Oral Cavity - Dentition: Adequate natural dentition Vision: Functional for self-feeding Self-Feeding Abilities: Able to feed self Patient Positioning: Upright in bed Baseline Vocal Quality: Normal Volitional Cough: Strong Volitional Swallow: Able to elicit    Oral/Motor/Sensory Function Overall Oral Motor/Sensory Function: Within functional limits   Ice Chips Ice chips: Within functional limits Presentation: Spoon (fed; 1 trial)   Thin Liquid Thin Liquid: Within functional limits Presentation: Cup;Self Fed;Straw (~6-7 ozs total) Other Comments: juice, water    Nectar Thick Nectar Thick Liquid: Not tested   Honey Thick Honey Thick Liquid: Not tested   Puree Puree: Within functional limits Presentation: Self Fed;Spoon (10+ trials)   Solid     Solid: Within functional limits Presentation: Self Fed (10+ trials)        Jerilynn Som, MS, CCC-SLP Speech Language Pathologist Rehab Services; [facility] [place] [phone] (ascom) Saleena Tamas 09/01/2022,1:47 PM

## 2022-09-01 NOTE — Evaluation (Signed)
Occupational Therapy Evaluation Patient Details Name: Jessica Wall MRN: [id number] DOB: [birth year] Today's Date: 09/01/2022   History of Present Illness 77 y.o  female with significant PMH of HTN on Lisinopril, HLD, Hypothyroidism, Giant Cell Arteritis, steroid induced hyperglycemia,  chronic left shoulder pain who presented to the ED with chief complaints of sudden onset tongue swelling.   Clinical Impression   Ms Helfman was seen for OT/PT co-evaluation this date. Prior to hospital admission, pt was IND. Pt lives with spouse in level entry home. Pt currently requires SUPERVISION for ADL t/f ~200 ft and functional reaching task with x2 standing rest breaks, max HR 125 bpm. +2 for lines/leads mgmt. IND toileting and LB dressing seated on BSC. Educated on falls prevention and ECS, no skilled acute OT needs identified, will sign off. Upon hospital discharge, recommend no follow up therapy.   Recommendations for follow up therapy are one component of a multi-disciplinary discharge planning process, led by the attending physician.  Recommendations may be updated based on patient status, additional functional criteria and insurance authorization.   Assistance Recommended at Discharge Set up Supervision/Assistance  Patient can return home with the following Help with stairs or ramp for entrance    Functional Status Assessment  Patient has had a recent decline in their functional status and demonstrates the ability to make significant improvements in function in a reasonable and predictable amount of time.  Equipment Recommendations  None recommended by OT    Recommendations for Other Services       Precautions / Restrictions Precautions Precautions: None Restrictions Weight Bearing Restrictions: No      Mobility Bed Mobility Overal bed mobility: Independent                  Transfers Overall transfer level: Independent Equipment used: None                       Balance Overall balance assessment: No apparent balance deficits (not formally assessed)                                         ADL either performed or assessed with clinical judgement   ADL Overall ADL's : Needs assistance/impaired                                       General ADL Comments: SUPERVISION for ADL t/f ~200 ft and functional reaching task with x2 standing rest breaks, max HR 125 bpm. IND toileting and LB dressing seated on BSC      Pertinent Vitals/Pain Pain Assessment Pain Assessment: No/denies pain     Hand Dominance     Extremity/Trunk Assessment Upper Extremity Assessment Upper Extremity Assessment: Overall WFL for tasks assessed   Lower Extremity Assessment Lower Extremity Assessment: Overall WFL for tasks assessed       Communication Communication Communication: No difficulties   Cognition Arousal/Alertness: Awake/alert Behavior During Therapy: WFL for tasks assessed/performed Overall Cognitive Status: Within Functional Limits for tasks assessed                                                  Home Living Family/patient  expects to be discharged to:: Private residence Living Arrangements: Spouse/significant other Available Help at Discharge: Family Type of Home: House Home Access: Level entry     Home Layout: One level                          Prior Functioning/Environment Prior Level of Function : Independent/Modified Independent;Driving             Mobility Comments: works at [employer] Problem List: Decreased safety awareness      OT Treatment/Interventions:      OT Goals(Current goals can be found in the care plan section) Acute Rehab OT Goals Patient Stated Goal: to go home OT Goal Formulation: With patient Time For Goal Achievement: 09/01/22 Potential to Achieve Goals: Good  OT Frequency:      Co-evaluation PT/OT/SLP  Co-Evaluation/Treatment: Yes Reason for Co-Treatment: To address functional/ADL transfers PT goals addressed during session: Mobility/safety with mobility OT goals addressed during session: ADL's and self-care      AM-PAC OT "6 Clicks" Daily Activity     Outcome Measure Help from another person eating meals?: None Help from another person taking care of personal grooming?: None Help from another person toileting, which includes using toliet, bedpan, or urinal?: None Help from another person bathing (including washing, rinsing, drying)?: A Little Help from another person to put on and taking off regular upper body clothing?: None Help from another person to put on and taking off regular lower body clothing?: None 6 Click Score: 23   End of Session    Activity Tolerance: Patient tolerated treatment well Patient left: in chair;with call bell/phone within reach  OT Visit Diagnosis: Muscle weakness (generalized) (M62.81)                Time: 1610-9604 OT Time Calculation (min): 13 min Charges:  OT General Charges $OT Visit: 1 Visit OT Evaluation $OT Eval Low Complexity: 1 Low  Kathie Dike, M.S. OTR/L  09/01/22, 2:26 PM  ascom [phone]

## 2022-09-02 LAB — C4 COMPLEMENT: Complement C4, Body Fluid: 44 mg/dL — ABNORMAL HIGH (ref 12–38)

## 2022-09-03 ENCOUNTER — Telehealth: Payer: Self-pay | Admitting: Internal Medicine

## 2022-09-03 ENCOUNTER — Other Ambulatory Visit: Payer: Self-pay | Admitting: Family

## 2022-09-03 DIAGNOSIS — T783XXA Angioneurotic edema, initial encounter: Secondary | ICD-10-CM

## 2022-09-03 LAB — TRYPTASE: Tryptase: 5.1 ug/L (ref 2.2–13.2)

## 2022-09-03 LAB — GLUCOSE, CAPILLARY: Glucose-Capillary: 107 mg/dL — ABNORMAL HIGH (ref 70–99)

## 2022-09-03 MED ORDER — METHYLPREDNISOLONE 4 MG PO TBPK
ORAL_TABLET | ORAL | 0 refills | Status: DC
Start: 2022-09-03 — End: 2022-09-18

## 2022-09-03 NOTE — Telephone Encounter (Signed)
This message is being sent to the Doc of the day.  Patient was in the hospital over the weekend. The doctor at the hospital called in  Disp Refills Start End  methylPREDNISolone (MEDROL DOSEPAK) 4 MG TBPK tablet 21 tablet. Doctor did not give instructions to the pharmacy on how to take and will not release medication to patient  until they have instructions.

## 2022-09-03 NOTE — Telephone Encounter (Signed)
Spoke to  pt husband and informed him of the instructions for prednisone, and also  to ensure she takes Medrol tablets before 12 because it in terferes with sleep and to schedule f/up with Dr Lorin Picket this week

## 2022-09-03 NOTE — Telephone Encounter (Signed)
noted 

## 2022-09-03 NOTE — Telephone Encounter (Signed)
Spoke to Receptionist @ [employer]. She had to transfer me to Rheumatology as Dr Allena Katz was the one who prescribed the Prednisone taper. Spoke to [facility] @ Rheumatology and she stated that she would speak to  Dr. Allena Katz and give me a call back because he was in a room with a pt. Number was provided

## 2022-09-03 NOTE — Telephone Encounter (Signed)
Synthroid 75 mcg is not on med list. Synthroid 50 mcg is current. Will d/c 75 mcg at pharmacy

## 2022-09-03 NOTE — Telephone Encounter (Signed)
Jessica Wall pool  [facility] [facility] I sent the below to Dr Karna Christmas in regards to clarifying medrol taper after hospital discharge  Dr Karna Christmas, Josefa Half on call this morning for our [facility] /Primary care . Dr Lorin Picket not in the office. [name] has called in today in regards to prednisone taper and instructions at discharge which were not sent. It was to dispense 21 tablets. Were you thinking 6 day taper with prednisone 4mg  , 6 tablets, 5 tablets, 4 tablets, 3 tablets, 2 tablets and then 1 ? Trena Dunavan cell [phone]

## 2022-09-03 NOTE — Telephone Encounter (Signed)
Patient was seen on 4/30, Office refilled her  levothyroxine (SYNTHROID) 50 MCG tablet and the 75mg  was also refill, patient did not pick up. Please remove  levothyroxine (SYNTHROID) 75 MCG tablet

## 2022-09-11 ENCOUNTER — Telehealth: Payer: Self-pay

## 2022-09-11 NOTE — Telephone Encounter (Signed)
Patient was discharged from hospital on 09/01/2022 and we need to schedule a hospital follow-up.  I do not see any available appointments on Dr. Westley Hummer Scott's schedule and I was unable to reach [facility], LPN, at the time of the call.  Please let us know when you would like to see patient.

## 2022-09-12 NOTE — Telephone Encounter (Signed)
LM to schedule appointment for HFU. Can use spot next week. Should have a couple HFU spots open

## 2022-09-13 NOTE — Telephone Encounter (Signed)
Per chart patient scheduled for HFU 6/4

## 2022-09-18 ENCOUNTER — Encounter: Payer: Self-pay | Admitting: Internal Medicine

## 2022-09-18 ENCOUNTER — Inpatient Hospital Stay: Payer: No Typology Code available for payment source | Admitting: Internal Medicine

## 2022-09-18 ENCOUNTER — Ambulatory Visit (INDEPENDENT_AMBULATORY_CARE_PROVIDER_SITE_OTHER): Payer: No Typology Code available for payment source | Admitting: Internal Medicine

## 2022-09-18 VITALS — BP 98/62 | HR 60 | Temp 98.2°F | Resp 16 | Ht 63.0 in | Wt 117.2 lb

## 2022-09-18 DIAGNOSIS — I10 Essential (primary) hypertension: Secondary | ICD-10-CM | POA: Diagnosis not present

## 2022-09-18 DIAGNOSIS — T783XXA Angioneurotic edema, initial encounter: Secondary | ICD-10-CM | POA: Diagnosis not present

## 2022-09-18 DIAGNOSIS — E7800 Pure hypercholesterolemia, unspecified: Secondary | ICD-10-CM | POA: Diagnosis not present

## 2022-09-18 DIAGNOSIS — E039 Hypothyroidism, unspecified: Secondary | ICD-10-CM

## 2022-09-18 DIAGNOSIS — Z86010 Personal history of colonic polyps: Secondary | ICD-10-CM

## 2022-09-18 DIAGNOSIS — T464X5A Adverse effect of angiotensin-converting-enzyme inhibitors, initial encounter: Secondary | ICD-10-CM | POA: Diagnosis not present

## 2022-09-18 DIAGNOSIS — F439 Reaction to severe stress, unspecified: Secondary | ICD-10-CM

## 2022-09-18 DIAGNOSIS — D649 Anemia, unspecified: Secondary | ICD-10-CM | POA: Diagnosis not present

## 2022-09-18 DIAGNOSIS — E1165 Type 2 diabetes mellitus with hyperglycemia: Secondary | ICD-10-CM

## 2022-09-18 NOTE — Progress Notes (Signed)
Subjective:    [name], female    DOB: 1946/04/13, 77 y.o.   MRN: [id number]  Patient here for  Chief Complaint  Patient presents with   Hospitalization Follow-up    HPI Here for a hospital follow up.  Presented with tongue swelling.  Received dexamethasone, benadryl and famotidine.  Lisinopril was discontinued.  She was started on amlodipine.  Discharged on prednisone.  No swelling now.  If sits still - joints stiff.  Injection helped her left shoulder.  Was on prednisone previous for TA. States taste has not been right since being on prednisone.  She is eating.  No vomiting.  No chest pain or sob reported.  No abdominal pain reported.  Some hand numbness.     Past Medical History:  Diagnosis Date   GERD (gastroesophageal reflux disease)    Goiter    s/p thyroidectomy   H/O syncope    Hypercholesterolemia    Hypothyroidism    Recurrent sinus infections    and allergy problems   Past Surgical History:  Procedure Laterality Date   ARTERY BIOPSY Right 03/13/2021   Procedure: BIOPSY TEMPORAL ARTERY;  Surgeon: Earline Mayotte, MD;  Location: [facility] ORS;  Service: General;  Laterality: Right;   BREAST BIOPSY     COLONOSCOPY WITH PROPOFOL N/A 11/03/2014   Procedure: COLONOSCOPY WITH PROPOFOL;  Surgeon: Scot Jun, MD;  Location: [facility] ENDOSCOPY;  Service: Endoscopy;  Laterality: N/A;   COLONOSCOPY WITH PROPOFOL N/A 02/12/2018   Procedure: COLONOSCOPY WITH PROPOFOL;  Surgeon: Scot Jun, MD;  Location: [facility] ENDOSCOPY;  Service: Endoscopy;  Laterality: N/A;   DILATION AND CURETTAGE OF UTERUS  1980 & 1993   ESOPHAGOGASTRODUODENOSCOPY N/A 11/03/2014   Procedure: ESOPHAGOGASTRODUODENOSCOPY (EGD);  Surgeon: Scot Jun, MD;  Location: [facility] ENDOSCOPY;  Service: Endoscopy;  Laterality: N/A;   LAPAROSCOPIC APPENDECTOMY N/A 11/26/2014   Procedure: APPENDECTOMY LAPAROSCOPIC;  Surgeon: Earline Mayotte, MD;  Location: [facility] ORS;  Service: General;  Laterality:  N/A;   thyroidectomy  1979   goiter   TUBAL LIGATION  1975   Family History  Problem Relation Age of Onset   Tuberculosis Paternal Grandfather    Alzheimer's disease Mother    Goiter Mother    Cancer Brother    Cancer Brother    Cancer Brother    Cancer Brother    Social History   Socioeconomic History   Marital status: Married    Spouse name: Not on file   Number of children: Not on file   Years of education: Not on file   Highest education level: Not on file  Occupational History   Not on file  Tobacco Use   Smoking status: Former    Types: Cigarettes    Quit date: 04/16/2009    Years since quitting: 13.4   Smokeless tobacco: Never  Substance and Sexual Activity   Alcohol use: Yes    Alcohol/week: 3.0 - 4.0 standard drinks of alcohol    Types: 3 - 4 Standard drinks or equivalent per week    Comment: occ   Drug use: Yes    Types: Marijuana    Comment: marijuana every day   Sexual activity: Not on file  Other Topics Concern   Not on file  Social History Narrative   Not on file   Social Determinants of Health   Financial Resource Strain: Low Risk  (05/25/2022)   Overall Financial Resource Strain (CARDIA)    Difficulty of Paying Living Expenses:  Not hard at all  Food Insecurity: No Food Insecurity (05/25/2022)   Hunger Vital Sign    Worried About Running Out of Food in the Last Year: Never true    Ran Out of Food in the Last Year: Never true  Transportation Needs: No Transportation Needs (05/25/2022)   PRAPARE - Administrator, Civil Service (Medical): No    Lack of Transportation (Non-Medical): No  Physical Activity: Insufficiently Active (05/25/2022)   Exercise Vital Sign    Days of Exercise per Week: 3 days    Minutes of Exercise per Session: 30 min  Stress: No Stress Concern Present (05/25/2022)   [employer] of Occupational Health - Occupational Stress Questionnaire    Feeling of Stress : Not at all  Social Connections: Unknown (05/25/2022)    Social Connection and Isolation Panel [NHANES]    Frequency of Communication with Friends and Family: More than three times a week    Frequency of Social Gatherings with Friends and Family: More than three times a week    Attends Religious Services: Not on Marketing executive or Organizations: Not on file    Attends Banker Meetings: Not on file    Marital Status: Not on file     Review of Systems  Constitutional:  Negative for appetite change and unexpected weight change.  HENT:  Negative for congestion and sinus pressure.   Respiratory:  Negative for cough, chest tightness and shortness of breath.   Cardiovascular:  Negative for chest pain, palpitations and leg swelling.  Gastrointestinal:  Negative for abdominal pain, diarrhea, nausea and vomiting.  Genitourinary:  Negative for difficulty urinating and dysuria.  Musculoskeletal:  Negative for myalgias.       Joint stiffness as outlined.   Skin:  Negative for color change and rash.  Neurological:  Negative for dizziness and headaches.  Psychiatric/Behavioral:  Negative for agitation and dysphoric mood.        Objective:     BP 98/62   Pulse 60   Temp 98.2 F (36.8 C)   Resp 16   Ht 5\' 3"  (1.6 m)   Wt 117 lb 3.2 oz (53.2 kg)   SpO2 99%   BMI 20.76 kg/m  Wt Readings from Last 3 Encounters:  09/18/22 117 lb 3.2 oz (53.2 kg)  09/01/22 116 lb 10 oz (52.9 kg)  08/14/22 119 lb (54 kg)    Physical Exam Vitals reviewed.  Constitutional:      General: She is not in acute distress.    Appearance: Normal appearance.  HENT:     Head: Normocephalic and atraumatic.     Right Ear: External ear normal.     Left Ear: External ear normal.  Eyes:     General: No scleral icterus.       Right eye: No discharge.        Left eye: No discharge.     Conjunctiva/sclera: Conjunctivae normal.  Neck:     Thyroid: No thyromegaly.  Cardiovascular:     Rate and Rhythm: Normal rate and regular rhythm.  Pulmonary:      Effort: No respiratory distress.     Breath sounds: Normal breath sounds. No wheezing.  Abdominal:     General: Bowel sounds are normal.     Palpations: Abdomen is soft.     Tenderness: There is no abdominal tenderness.  Musculoskeletal:        General: No swelling or tenderness.  Cervical back: Neck supple. No tenderness.  Lymphadenopathy:     Cervical: No cervical adenopathy.  Skin:    Findings: No erythema or rash.  Neurological:     Mental Status: She is alert.  Psychiatric:        Mood and Affect: Mood normal.        Behavior: Behavior normal.      Outpatient Encounter Medications as of 09/18/2022  Medication Sig   amLODipine (NORVASC) 2.5 MG tablet Take 2.5 mg by mouth daily.   cyanocobalamin 1000 MCG tablet Take 1,000 mcg by mouth daily.   ferrous sulfate 325 (65 FE) MG tablet Take 325 mg by mouth daily with breakfast.   levothyroxine (SYNTHROID) 50 MCG tablet Take 1 tablet (50 mcg total) by mouth daily.   rosuvastatin (CRESTOR) 10 MG tablet TAKE 1 TABLET BY MOUTH EVERY DAY   [DISCONTINUED] methylPREDNISolone (MEDROL DOSEPAK) 4 MG TBPK tablet 6 tablets PO day 1, 5 tablets day 2, 4 tablets day 3, 3 tablets day 4, 2 tablets day 5 , 1 tablet day 6.   No facility-administered encounter medications on file as of 09/18/2022.     Lab Results  Component Value Date   WBC 6.2 09/18/2022   HGB 9.5 (L) 09/18/2022   HCT 28.3 (L) 09/18/2022   PLT 258.0 09/18/2022   GLUCOSE 89 09/18/2022   CHOL 134 09/18/2022   TRIG 73.0 09/18/2022   HDL 66.10 09/18/2022   LDLCALC 54 09/18/2022   ALT 7 09/18/2022   AST 13 09/18/2022   NA 140 09/18/2022   K 4.5 09/18/2022   CL 103 09/18/2022   CREATININE 0.99 09/18/2022   BUN 14 09/18/2022   CO2 25 09/18/2022   TSH 3.10 09/18/2022   INR 1.2 06/07/2021   HGBA1C 6.6 (H) 09/18/2022    DG Chest Port 1 View  Result Date: 09/01/2022 CLINICAL DATA:  77 year old female with history of pulmonary infiltrates noted on chest x-ray. EXAM:  PORTABLE CHEST 1 VIEW COMPARISON:  Chest x-ray 06/07/2021. FINDINGS: Lung volumes are normal. No consolidative airspace disease. No pleural effusions. No pneumothorax. No pulmonary nodule or mass noted. Pulmonary vasculature and the cardiomediastinal silhouette are within normal limits. IMPRESSION: No radiographic evidence of acute cardiopulmonary disease. Electronically Signed   By: Trudie Reed M.D.   On: 09/01/2022 09:24       Assessment & Plan:  Anemia, unspecified type Assessment & Plan: Colonoscopy 09/27/20 - pathology - rectal polyp - hyperplastic polyp.  Recommended no f/u colonoscopy.  No GI symptoms currently.  Declines further w/up at this time. Recheck cbc and iron studies today.    Orders: -     CBC with Differential/Platelet -     IBC + Ferritin  Essential hypertension, benign Assessment & Plan: Blood pressure has been doing well.  She continues on amlodipine. Remain off lisinopril. Follow blood pressures.  Follow metabolic panel.   Orders: -     Basic metabolic panel  Hypercholesterolemia Assessment & Plan: Continue crestor.  Follow lipid panel and liver function tests.   Lab Results  Component Value Date   CHOL 134 09/18/2022   HDL 66.10 09/18/2022   LDLCALC 54 09/18/2022   TRIG 73.0 09/18/2022   CHOLHDL 2 09/18/2022    Orders: -     Hepatic function panel -     Lipid panel  Hypothyroidism, unspecified type Assessment & Plan: On synthroid.  Follow tsh.   Orders: -     TSH  Type 2 diabetes mellitus with hyperglycemia, without  long-term current use of insulin (HCC) Assessment & Plan: Follow met b and A1c.  Off prednisone.   Orders: -     Hemoglobin A1c  Angioedema due to angiotensin converting enzyme inhibitor (ACE-I) Assessment & Plan: Recent hospitalization.  Attributed her reaction to ace inhibitor.  No swelling now.  Follow.    History of colonic polyps Assessment & Plan: Colonoscopy 09/2020 - recommended no f/u colonoscopy.      Stress Assessment & Plan: Increased stress with current medical issues, prednisone, etc.  Discussed.  Does not feel needs further intervention at this time.  Follow.       Dale Newport, MD

## 2022-09-19 LAB — HEPATIC FUNCTION PANEL
ALT: 7 U/L (ref 0–35)
AST: 13 U/L (ref 0–37)
Albumin: 3.4 g/dL — ABNORMAL LOW (ref 3.5–5.2)
Alkaline Phosphatase: 65 U/L (ref 39–117)
Bilirubin, Direct: 0.1 mg/dL (ref 0.0–0.3)
Total Bilirubin: 0.3 mg/dL (ref 0.2–1.2)
Total Protein: 6.1 g/dL (ref 6.0–8.3)

## 2022-09-19 LAB — IBC + FERRITIN
Ferritin: 167 ng/mL (ref 10.0–291.0)
Iron: 20 ug/dL — ABNORMAL LOW (ref 42–145)
Saturation Ratios: 8.2 % — ABNORMAL LOW (ref 20.0–50.0)
TIBC: 243.6 ug/dL — ABNORMAL LOW (ref 250.0–450.0)
Transferrin: 174 mg/dL — ABNORMAL LOW (ref 212.0–360.0)

## 2022-09-19 LAB — CBC WITH DIFFERENTIAL/PLATELET
Basophils Absolute: 0.1 10*3/uL (ref 0.0–0.1)
Basophils Relative: 1.2 % (ref 0.0–3.0)
Eosinophils Absolute: 0.1 10*3/uL (ref 0.0–0.7)
Eosinophils Relative: 1.3 % (ref 0.0–5.0)
HCT: 28.3 % — ABNORMAL LOW (ref 36.0–46.0)
Hemoglobin: 9.5 g/dL — ABNORMAL LOW (ref 12.0–15.0)
Lymphocytes Relative: 34.9 % (ref 12.0–46.0)
Lymphs Abs: 2.2 10*3/uL (ref 0.7–4.0)
MCHC: 33.5 g/dL (ref 30.0–36.0)
MCV: 95.3 fl (ref 78.0–100.0)
Monocytes Absolute: 0.5 10*3/uL (ref 0.1–1.0)
Monocytes Relative: 7.4 % (ref 3.0–12.0)
Neutro Abs: 3.4 10*3/uL (ref 1.4–7.7)
Neutrophils Relative %: 55.2 % (ref 43.0–77.0)
Platelets: 258 10*3/uL (ref 150.0–400.0)
RBC: 2.97 Mil/uL — ABNORMAL LOW (ref 3.87–5.11)
RDW: 15.2 % (ref 11.5–15.5)
WBC: 6.2 10*3/uL (ref 4.0–10.5)

## 2022-09-19 LAB — BASIC METABOLIC PANEL
BUN: 14 mg/dL (ref 6–23)
CO2: 25 mEq/L (ref 19–32)
Calcium: 8.5 mg/dL (ref 8.4–10.5)
Chloride: 103 mEq/L (ref 96–112)
Creatinine, Ser: 0.99 mg/dL (ref 0.40–1.20)
GFR: 55.89 mL/min — ABNORMAL LOW (ref >60.00)
Glucose, Bld: 89 mg/dL (ref 70–99)
Potassium: 4.5 mEq/L (ref 3.5–5.1)
Sodium: 140 mEq/L (ref 135–145)

## 2022-09-19 LAB — TSH: TSH: 3.1 u[IU]/mL (ref 0.35–5.50)

## 2022-09-19 LAB — LIPID PANEL
Cholesterol: 134 mg/dL (ref 0–200)
HDL: 66.1 mg/dL (ref >39.00)
LDL Cholesterol: 54 mg/dL (ref 0–99)
NonHDL: 68.21
Total CHOL/HDL Ratio: 2
Triglycerides: 73 mg/dL (ref 0.0–149.0)
VLDL: 14.6 mg/dL (ref 0.0–40.0)

## 2022-09-19 LAB — HEMOGLOBIN A1C: Hgb A1c MFr Bld: 6.6 % — ABNORMAL HIGH (ref 4.6–6.5)

## 2022-09-20 ENCOUNTER — Telehealth: Payer: Self-pay | Admitting: Internal Medicine

## 2022-09-20 NOTE — Telephone Encounter (Signed)
Blood pressure controlled per chart.

## 2022-09-23 ENCOUNTER — Encounter: Payer: Self-pay | Admitting: Internal Medicine

## 2022-09-23 NOTE — Assessment & Plan Note (Signed)
Colonoscopy 09/2020 - recommended no f/u colonoscopy.   

## 2022-09-23 NOTE — Assessment & Plan Note (Signed)
Recent hospitalization.  Attributed her reaction to ace inhibitor.  No swelling now.  Follow.

## 2022-09-23 NOTE — Assessment & Plan Note (Signed)
Continue crestor.  Follow lipid panel and liver function tests.   Lab Results  Component Value Date   CHOL 134 09/18/2022   HDL 66.10 09/18/2022   LDLCALC 54 09/18/2022   TRIG 73.0 09/18/2022   CHOLHDL 2 09/18/2022

## 2022-09-23 NOTE — Assessment & Plan Note (Signed)
Follow met b and A1c.  Off prednisone.

## 2022-09-23 NOTE — Assessment & Plan Note (Signed)
On synthroid.  Follow tsh.   

## 2022-09-23 NOTE — Assessment & Plan Note (Addendum)
Colonoscopy 09/27/20 - pathology - rectal polyp - hyperplastic polyp.  Recommended no f/u colonoscopy.  No GI symptoms currently.  Declines further w/up at this time. Recheck cbc and iron studies today.

## 2022-09-23 NOTE — Assessment & Plan Note (Signed)
Increased stress with current medical issues, prednisone, etc.  Discussed.  Does not feel needs further intervention at this time.  Follow.  

## 2022-09-23 NOTE — Assessment & Plan Note (Signed)
Blood pressure has been doing well.  She continues on amlodipine. Remain off lisinopril. Follow blood pressures.  Follow metabolic panel.

## 2022-09-24 ENCOUNTER — Telehealth: Payer: Self-pay

## 2022-09-24 NOTE — Telephone Encounter (Signed)
-----   Message from Dale Montrose-Ghent, MD sent at 09/23/2022 10:43 PM EDT ----- Notify - hgb is low.  Iron low.  If not taking any iron, I want her to start integra q day.  Also, given anemia, needs f/u with GI for further w/up and evaluation.  Kidney function improved. Overall sugar control stable.  A1c 6.6.  cholesterol levels look good.  Thyroid and liver function tests are wnl.

## 2022-09-29 ENCOUNTER — Encounter: Payer: Self-pay | Admitting: Internal Medicine

## 2022-10-01 NOTE — Telephone Encounter (Signed)
Spoke with daughter. She is not on DPR. Advised that I cannot give out any medical info with out updated DPR. Daughter was ok with this and stated that she is planning to come to appt with her mom on 7/3. Will update DPR. Will reach out to patient regarding hip.

## 2022-10-01 NOTE — Telephone Encounter (Signed)
Patient's daughter Lupita Dawn [phone]. She was calling about MyChart message. She wasn't sure if a referral was put in for a MRI for her hip.

## 2022-10-03 ENCOUNTER — Other Ambulatory Visit: Payer: Self-pay

## 2022-10-03 DIAGNOSIS — D649 Anemia, unspecified: Secondary | ICD-10-CM

## 2022-10-03 NOTE — Telephone Encounter (Signed)
See result note.  

## 2022-10-10 DIAGNOSIS — M25552 Pain in left hip: Secondary | ICD-10-CM | POA: Diagnosis not present

## 2022-10-10 DIAGNOSIS — M25562 Pain in left knee: Secondary | ICD-10-CM | POA: Diagnosis not present

## 2022-10-10 DIAGNOSIS — G8929 Other chronic pain: Secondary | ICD-10-CM | POA: Diagnosis not present

## 2022-10-10 DIAGNOSIS — Z7952 Long term (current) use of systemic steroids: Secondary | ICD-10-CM | POA: Diagnosis not present

## 2022-10-10 DIAGNOSIS — M316 Other giant cell arteritis: Secondary | ICD-10-CM | POA: Diagnosis not present

## 2022-10-17 ENCOUNTER — Telehealth: Payer: Self-pay | Admitting: Internal Medicine

## 2022-10-17 ENCOUNTER — Encounter: Payer: Self-pay | Admitting: Internal Medicine

## 2022-10-17 ENCOUNTER — Ambulatory Visit (INDEPENDENT_AMBULATORY_CARE_PROVIDER_SITE_OTHER): Payer: No Typology Code available for payment source | Admitting: Internal Medicine

## 2022-10-17 VITALS — BP 108/70 | HR 64 | Temp 97.9°F | Resp 16 | Ht 63.0 in | Wt 113.4 lb

## 2022-10-17 DIAGNOSIS — F439 Reaction to severe stress, unspecified: Secondary | ICD-10-CM | POA: Diagnosis not present

## 2022-10-17 DIAGNOSIS — R634 Abnormal weight loss: Secondary | ICD-10-CM | POA: Diagnosis not present

## 2022-10-17 DIAGNOSIS — R9089 Other abnormal findings on diagnostic imaging of central nervous system: Secondary | ICD-10-CM

## 2022-10-17 DIAGNOSIS — M8700 Idiopathic aseptic necrosis of unspecified bone: Secondary | ICD-10-CM

## 2022-10-17 DIAGNOSIS — E7800 Pure hypercholesterolemia, unspecified: Secondary | ICD-10-CM | POA: Diagnosis not present

## 2022-10-17 DIAGNOSIS — R739 Hyperglycemia, unspecified: Secondary | ICD-10-CM

## 2022-10-17 DIAGNOSIS — I10 Essential (primary) hypertension: Secondary | ICD-10-CM | POA: Diagnosis not present

## 2022-10-17 DIAGNOSIS — E039 Hypothyroidism, unspecified: Secondary | ICD-10-CM

## 2022-10-17 DIAGNOSIS — E1165 Type 2 diabetes mellitus with hyperglycemia: Secondary | ICD-10-CM

## 2022-10-17 DIAGNOSIS — D649 Anemia, unspecified: Secondary | ICD-10-CM

## 2022-10-17 LAB — CBC WITH DIFFERENTIAL/PLATELET
Basophils Absolute: 0 10*3/uL (ref 0.0–0.1)
Basophils Relative: 0.5 % (ref 0.0–3.0)
Eosinophils Absolute: 0.1 10*3/uL (ref 0.0–0.7)
Eosinophils Relative: 1.5 % (ref 0.0–5.0)
HCT: 30.7 % — ABNORMAL LOW (ref 36.0–46.0)
Hemoglobin: 10.1 g/dL — ABNORMAL LOW (ref 12.0–15.0)
Lymphocytes Relative: 34.5 % (ref 12.0–46.0)
Lymphs Abs: 2.5 10*3/uL (ref 0.7–4.0)
MCHC: 32.8 g/dL (ref 30.0–36.0)
MCV: 91.1 fl (ref 78.0–100.0)
Monocytes Absolute: 0.6 10*3/uL (ref 0.1–1.0)
Monocytes Relative: 7.6 % (ref 3.0–12.0)
Neutro Abs: 4.1 10*3/uL (ref 1.4–7.7)
Neutrophils Relative %: 55.9 % (ref 43.0–77.0)
Platelets: 416 10*3/uL — ABNORMAL HIGH (ref 150.0–400.0)
RBC: 3.37 Mil/uL — ABNORMAL LOW (ref 3.87–5.11)
RDW: 15.4 % (ref 11.5–15.5)
WBC: 7.3 10*3/uL (ref 4.0–10.5)

## 2022-10-17 LAB — IBC + FERRITIN
Ferritin: 237 ng/mL (ref 10.0–291.0)
Iron: 16 ug/dL — ABNORMAL LOW (ref 42–145)
Saturation Ratios: 6.4 % — ABNORMAL LOW (ref 20.0–50.0)
TIBC: 249.2 ug/dL — ABNORMAL LOW (ref 250.0–450.0)
Transferrin: 178 mg/dL — ABNORMAL LOW (ref 212.0–360.0)

## 2022-10-17 NOTE — Telephone Encounter (Signed)
Called ortho = scheduled appt for her hip pain - (avascular necrosis) - 10/23/22 - 10:00 - [place] Location.  Please call and notify of appt

## 2022-10-17 NOTE — Telephone Encounter (Signed)
Patients husband aware of appt date and time.

## 2022-10-17 NOTE — Progress Notes (Signed)
Subjective:    [name], female    DOB: Oct 21, 1945, 77 y.o.   MRN: [id number]  Patient here for  Chief Complaint  Patient presents with   Medical Management of Chronic Issues    HPI Here to follow up regarding hypercholesterolemia, diabetes and hypertension.  On amlodipine now.  Recent labs - A1c 6.6.  iron low.  Hgb low. Instructed to start oral iron. Referred to GI. Seeing rheumatology.  Last evaluated 10/10/22.  Recommended left hip and knee xray.  Also suggested actemra.  Reviewed hip xray with her and her husband today.  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  Increased pain in her hip.  Limiting activity.  She is unclear if she has an orthopedic appt.  Agreeable for referral.  No chest pain.  Breathing stable.  Weight continues to decrease.  No abdominal pain.  Bowels dark - taking iron.     Past Medical History:  Diagnosis Date   GERD (gastroesophageal reflux disease)    Goiter    s/p thyroidectomy   H/O syncope    Hypercholesterolemia    Hypothyroidism    Recurrent sinus infections    and allergy problems   Past Surgical History:  Procedure Laterality Date   ARTERY BIOPSY Right 03/13/2021   Procedure: BIOPSY TEMPORAL ARTERY;  Surgeon: Earline Mayotte, MD;  Location: [facility] ORS;  Service: General;  Laterality: Right;   BREAST BIOPSY     COLONOSCOPY WITH PROPOFOL N/A 11/03/2014   Procedure: COLONOSCOPY WITH PROPOFOL;  Surgeon: Scot Jun, MD;  Location: [facility] ENDOSCOPY;  Service: Endoscopy;  Laterality: N/A;   COLONOSCOPY WITH PROPOFOL N/A 02/12/2018   Procedure: COLONOSCOPY WITH PROPOFOL;  Surgeon: Scot Jun, MD;  Location: [facility] ENDOSCOPY;  Service: Endoscopy;  Laterality: N/A;   DILATION AND CURETTAGE OF UTERUS  1980 & 1993   ESOPHAGOGASTRODUODENOSCOPY N/A 11/03/2014   Procedure: ESOPHAGOGASTRODUODENOSCOPY (EGD);  Surgeon: Scot Jun, MD;  Location: [facility] ENDOSCOPY;   Service: Endoscopy;  Laterality: N/A;   LAPAROSCOPIC APPENDECTOMY N/A 11/26/2014   Procedure: APPENDECTOMY LAPAROSCOPIC;  Surgeon: Earline Mayotte, MD;  Location: [facility] ORS;  Service: General;  Laterality: N/A;   thyroidectomy  1979   goiter   TUBAL LIGATION  1975   Family History  Problem Relation Age of Onset   Tuberculosis Paternal Grandfather    Alzheimer's disease Mother    Goiter Mother    Cancer Brother    Cancer Brother    Cancer Brother    Cancer Brother    Social History   Socioeconomic History   Marital status: Married    Spouse name: Not on file   Number of children: Not on file   Years of education: Not on file   Highest education level: Not on file  Occupational History   Not on file  Tobacco Use   Smoking status: Former    Types: Cigarettes    Quit date: 04/16/2009    Years since quitting: 13.5   Smokeless tobacco: Never  Substance and Sexual Activity   Alcohol use: Yes    Alcohol/week: 3.0 - 4.0 standard drinks of alcohol    Types: 3 - 4 Standard drinks or equivalent per week    Comment: occ   Drug use: Yes    Types: Marijuana    Comment: marijuana every day   Sexual activity: Not on file  Other Topics Concern   Not on file  Social  History Narrative   Not on file   Social Determinants of Health   Financial Resource Strain: Low Risk  (05/25/2022)   Overall Financial Resource Strain (CARDIA)    Difficulty of Paying Living Expenses: Not hard at all  Food Insecurity: No Food Insecurity (05/25/2022)   Hunger Vital Sign    Worried About Running Out of Food in the Last Year: Never true    Ran Out of Food in the Last Year: Never true  Transportation Needs: No Transportation Needs (05/25/2022)   PRAPARE - Administrator, Civil Service (Medical): No    Lack of Transportation (Non-Medical): No  Physical Activity: Insufficiently Active (05/25/2022)   Exercise Vital Sign    Days of Exercise per Week: 3 days    Minutes of Exercise per Session: 30 min   Stress: No Stress Concern Present (05/25/2022)   [employer] of Occupational Health - Occupational Stress Questionnaire    Feeling of Stress : Not at all  Social Connections: Unknown (05/25/2022)   Social Connection and Isolation Panel [NHANES]    Frequency of Communication with Friends and Family: More than three times a week    Frequency of Social Gatherings with Friends and Family: More than three times a week    Attends Religious Services: Not on file    Active Member of Clubs or Organizations: Not on file    Attends Banker Meetings: Not on file    Marital Status: Not on file     Review of Systems  Constitutional:        She reports she is eating.  Weight continues to decrease.   HENT:  Negative for congestion and sinus pressure.   Respiratory:  Negative for cough, chest tightness and shortness of breath.   Cardiovascular:  Negative for chest pain and palpitations.       No increased swelling.   Gastrointestinal:  Negative for abdominal pain, nausea and vomiting.  Genitourinary:  Negative for difficulty urinating and dysuria.  Musculoskeletal:        Increased hip pain as outlined.    Skin:  Negative for color change and rash.  Neurological:  Negative for dizziness and headaches.  Psychiatric/Behavioral:  Negative for dysphoric mood.        Increased stress and frustration related to current medical issues.        Objective:     BP 108/70   Pulse 64   Temp 97.9 F (36.6 C)   Resp 16   Ht 5\' 3"  (1.6 m)   Wt 113 lb 6.4 oz (51.4 kg)   SpO2 99%   BMI 20.09 kg/m  Wt Readings from Last 3 Encounters:  10/17/22 113 lb 6.4 oz (51.4 kg)  09/18/22 117 lb 3.2 oz (53.2 kg)  09/01/22 116 lb 10 oz (52.9 kg)    Physical Exam Vitals reviewed.  Constitutional:      General: She is not in acute distress.    Appearance: Normal appearance.  HENT:     Head: Normocephalic and atraumatic.     Right Ear: External ear normal.     Left Ear: External ear normal.   Eyes:     General: No scleral icterus.       Right eye: No discharge.        Left eye: No discharge.     Conjunctiva/sclera: Conjunctivae normal.  Neck:     Thyroid: No thyromegaly.  Cardiovascular:     Rate and Rhythm: Normal rate and regular  rhythm.  Pulmonary:     Effort: No respiratory distress.     Breath sounds: Normal breath sounds. No wheezing.  Abdominal:     General: Bowel sounds are normal.     Palpations: Abdomen is soft.     Tenderness: There is no abdominal tenderness.  Genitourinary:    Comments: Rectal exam - heme negative.  Musculoskeletal:        General: No swelling or tenderness.     Cervical back: Neck supple. No tenderness.  Lymphadenopathy:     Cervical: No cervical adenopathy.  Skin:    Findings: No erythema or rash.  Neurological:     Mental Status: She is alert.  Psychiatric:        Mood and Affect: Mood normal.        Behavior: Behavior normal.      Outpatient Encounter Medications as of 10/17/2022  Medication Sig   amLODipine (NORVASC) 2.5 MG tablet Take 2.5 mg by mouth daily.   cyanocobalamin 1000 MCG tablet Take 1,000 mcg by mouth daily.   ferrous sulfate 325 (65 FE) MG tablet Take 325 mg by mouth daily with breakfast.   levothyroxine (SYNTHROID) 50 MCG tablet Take 1 tablet (50 mcg total) by mouth daily.   rosuvastatin (CRESTOR) 10 MG tablet TAKE 1 TABLET BY MOUTH EVERY DAY   No facility-administered encounter medications on file as of 10/17/2022.     Lab Results  Component Value Date   WBC 7.3 10/17/2022   HGB 10.1 (L) 10/17/2022   HCT 30.7 (L) 10/17/2022   PLT 416.0 (H) 10/17/2022   GLUCOSE 89 09/18/2022   CHOL 134 09/18/2022   TRIG 73.0 09/18/2022   HDL 66.10 09/18/2022   LDLCALC 54 09/18/2022   ALT 7 09/18/2022   AST 13 09/18/2022   NA 140 09/18/2022   K 4.5 09/18/2022   CL 103 09/18/2022   CREATININE 0.99 09/18/2022   BUN 14 09/18/2022   CO2 25 09/18/2022   TSH 3.10 09/18/2022   INR 1.2 06/07/2021   HGBA1C 6.6 (H)  09/18/2022    DG Chest Port 1 View  Result Date: 09/01/2022 CLINICAL DATA:  77 year old female with history of pulmonary infiltrates noted on chest x-ray. EXAM: PORTABLE CHEST 1 VIEW COMPARISON:  Chest x-ray 06/07/2021. FINDINGS: Lung volumes are normal. No consolidative airspace disease. No pleural effusions. No pneumothorax. No pulmonary nodule or mass noted. Pulmonary vasculature and the cardiomediastinal silhouette are within normal limits. IMPRESSION: No radiographic evidence of acute cardiopulmonary disease. Electronically Signed   By: Trudie Reed M.D.   On: 09/01/2022 09:24       Assessment & Plan:  Anemia, unspecified type Assessment & Plan: Colonoscopy 09/27/20 - pathology - rectal polyp - hyperplastic polyp.  Recommended no f/u colonoscopy.  Weight continues to decrease.  Hgb decreased.  Taking iron.  Heme negative on exam.  Has appt with GI later this month.  Recheck cbc and iron studies today.  Encourage increased po intake.   Orders: -     CBC with Differential/Platelet -     IBC + Ferritin  Abnormal MRA, brain Assessment & Plan: With recent headache, MRA brain performed during w/up.  MRA - Mildly motion degraded exam. No intracranial large vessel occlusion or proximal high-grade arterial stenosis.  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 Both posterior cerebral arteries demonstrate distal branch atherosclerotic irregularity. 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 Apparent 1-2 mm broad-based vascular protrusion arising from the supraclinoid right  ICA, which may reflect a small aneurysm or artifact related to motion.  Had referred her to neurology.  It appears they tried to schedule an appt.  No headache currently. She preferred to see Dr Allena Katz first.  She previously declined further w/up.  Will need f/u with neurology.    Essential hypertension, benign Assessment & Plan: Blood pressure has been doing well.  She  continues on amlodipine. Remain off lisinopril. Follow blood pressures.  Follow metabolic panel.    Hypercholesterolemia Assessment & Plan: Continue crestor.  Follow lipid panel and liver function tests.   Lab Results  Component Value Date   CHOL 134 09/18/2022   HDL 66.10 09/18/2022   LDLCALC 54 09/18/2022   TRIG 73.0 09/18/2022   CHOLHDL 2 09/18/2022     Hyperglycemia Assessment & Plan: Follow met b and a1c.    Hypothyroidism, unspecified type Assessment & Plan: On synthroid.  Follow tsh.    Stress Assessment & Plan: Increased stress with current medical issues.  Discussed.  Does not feel needs further intervention at this time.  Follow.    Type 2 diabetes mellitus with hyperglycemia, without long-term current use of insulin (HCC) Assessment & Plan: Follow met b and A1c.  Off prednisone.    Weight loss Assessment & Plan: Weight is down.  She is eating. No nausea or vomiting.  Anemic.  Iron low.  Has appt with GI later this month.  Recheck cbc and iron studies.  Also, get her in with ortho to see if we can address her underlying pain - hip with avascular necrosis.    Avascular necrosis (HCC) Assessment & Plan: Increased hip pain.  Saw rheumatology.  Hip xray obtained.  Discussed ortho follow up appt.  She is agreeable for referral.  Appt made with [facility].        Dale [place], MD

## 2022-10-21 ENCOUNTER — Encounter: Payer: Self-pay | Admitting: Internal Medicine

## 2022-10-21 DIAGNOSIS — M8700 Idiopathic aseptic necrosis of unspecified bone: Secondary | ICD-10-CM | POA: Insufficient documentation

## 2022-10-21 NOTE — Assessment & Plan Note (Signed)
Continue crestor.  Follow lipid panel and liver function tests.   Lab Results  Component Value Date   CHOL 134 09/18/2022   HDL 66.10 09/18/2022   LDLCALC 54 09/18/2022   TRIG 73.0 09/18/2022   CHOLHDL 2 09/18/2022   

## 2022-10-21 NOTE — Assessment & Plan Note (Signed)
Follow met b and A1c.  Off prednisone.  

## 2022-10-21 NOTE — Assessment & Plan Note (Signed)
Weight is down.  She is eating. No nausea or vomiting.  Anemic.  Iron low.  Has appt with GI later this month.  Recheck cbc and iron studies.  Also, get her in with ortho to see if we can address her underlying pain - hip with avascular necrosis.

## 2022-10-21 NOTE — Assessment & Plan Note (Signed)
Increased hip pain.  Saw rheumatology.  Hip xray obtained.  Discussed ortho follow up appt.  She is agreeable for referral.  Appt made with [facility].

## 2022-10-21 NOTE — Assessment & Plan Note (Signed)
Follow met b and a1c.  

## 2022-10-21 NOTE — Assessment & Plan Note (Signed)
Increased stress with current medical issues.  Discussed.  Does not feel needs further intervention at this time.  Follow.

## 2022-10-21 NOTE — Assessment & Plan Note (Signed)
On synthroid.  Follow tsh.   

## 2022-10-21 NOTE — Assessment & Plan Note (Signed)
Colonoscopy 09/27/20 - pathology - rectal polyp - hyperplastic polyp.  Recommended no f/u colonoscopy.  Weight continues to decrease.  Hgb decreased.  Taking iron.  Heme negative on exam.  Has appt with GI later this month.  Recheck cbc and iron studies today.  Encourage increased po intake.

## 2022-10-21 NOTE — Assessment & Plan Note (Signed)
With recent headache, MRA brain performed during w/up.  MRA - Mildly motion degraded exam. No intracranial large vessel occlusion or proximal high-grade arterial stenosis.  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 Both posterior cerebral arteries demonstrate distal branch atherosclerotic irregularity. 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 Apparent 1-2 mm broad-based vascular protrusion arising from the supraclinoid right ICA, which may reflect a small aneurysm or artifact related to motion.  Had referred her to neurology.  It appears they tried to schedule an appt.  No headache currently. She preferred to see Dr Allena Katz first.  She previously declined further w/up.  Will need f/u with neurology.

## 2022-10-21 NOTE — Assessment & Plan Note (Signed)
Blood pressure has been doing well.  She continues on amlodipine. Remain off lisinopril. Follow blood pressures.  Follow metabolic panel.  

## 2022-10-23 DIAGNOSIS — M5440 Lumbago with sciatica, unspecified side: Secondary | ICD-10-CM | POA: Diagnosis not present

## 2022-10-23 DIAGNOSIS — E1165 Type 2 diabetes mellitus with hyperglycemia: Secondary | ICD-10-CM | POA: Diagnosis not present

## 2022-10-23 DIAGNOSIS — M19012 Primary osteoarthritis, left shoulder: Secondary | ICD-10-CM | POA: Diagnosis not present

## 2022-10-23 DIAGNOSIS — M87052 Idiopathic aseptic necrosis of left femur: Secondary | ICD-10-CM | POA: Diagnosis not present

## 2022-10-24 ENCOUNTER — Telehealth: Payer: Self-pay | Admitting: Internal Medicine

## 2022-10-24 NOTE — Telephone Encounter (Signed)
Pt daughter called in stating that she would like to speck to [place] regarding her mom. She did not provided me with any other info.

## 2022-10-25 ENCOUNTER — Encounter: Payer: Self-pay | Admitting: Internal Medicine

## 2022-10-25 DIAGNOSIS — D649 Anemia, unspecified: Secondary | ICD-10-CM

## 2022-10-25 NOTE — Telephone Encounter (Signed)
My chart sent to daughter will be calling her today to discuss

## 2022-10-25 NOTE — Telephone Encounter (Signed)
Discussed with daughter. Ortho is seeing her and planning for surfery for her hip. GI referral is in place and appt is scheduled for 7/22 to see her for iron deficiency as well as persistent weight loss. Also discussed that Dr Lorin Picket recommended hematology referral to evaluate for possible IV iron infusions but patient declined. Daughter stated that they are going to proceed with hematology referral because it is important for her levels to be improved prior to her going to surgery. Daughter had no further questions. Hematology referral placed.

## 2022-10-31 ENCOUNTER — Inpatient Hospital Stay: Payer: No Typology Code available for payment source

## 2022-10-31 ENCOUNTER — Inpatient Hospital Stay: Payer: No Typology Code available for payment source | Attending: Oncology | Admitting: Oncology

## 2022-10-31 ENCOUNTER — Encounter: Payer: Self-pay | Admitting: Oncology

## 2022-10-31 VITALS — BP 104/62 | HR 71 | Temp 96.4°F | Resp 18 | Ht 63.0 in | Wt 113.1 lb

## 2022-10-31 DIAGNOSIS — I10 Essential (primary) hypertension: Secondary | ICD-10-CM | POA: Insufficient documentation

## 2022-10-31 DIAGNOSIS — K2100 Gastro-esophageal reflux disease with esophagitis, without bleeding: Secondary | ICD-10-CM | POA: Insufficient documentation

## 2022-10-31 DIAGNOSIS — M316 Other giant cell arteritis: Secondary | ICD-10-CM | POA: Insufficient documentation

## 2022-10-31 DIAGNOSIS — D649 Anemia, unspecified: Secondary | ICD-10-CM

## 2022-10-31 DIAGNOSIS — Z79899 Other long term (current) drug therapy: Secondary | ICD-10-CM | POA: Diagnosis not present

## 2022-10-31 DIAGNOSIS — E7800 Pure hypercholesterolemia, unspecified: Secondary | ICD-10-CM | POA: Insufficient documentation

## 2022-10-31 DIAGNOSIS — D508 Other iron deficiency anemias: Secondary | ICD-10-CM

## 2022-10-31 DIAGNOSIS — E1165 Type 2 diabetes mellitus with hyperglycemia: Secondary | ICD-10-CM | POA: Insufficient documentation

## 2022-10-31 LAB — CBC WITH DIFFERENTIAL/PLATELET
Abs Immature Granulocytes: 0.05 10*3/uL (ref 0.00–0.07)
Basophils Absolute: 0 10*3/uL (ref 0.0–0.1)
Basophils Relative: 0 %
Eosinophils Absolute: 0.1 10*3/uL (ref 0.0–0.5)
Eosinophils Relative: 1 %
HCT: 27.9 % — ABNORMAL LOW (ref 36.0–46.0)
Hemoglobin: 8.8 g/dL — ABNORMAL LOW (ref 12.0–15.0)
Immature Granulocytes: 1 %
Lymphocytes Relative: 31 %
Lymphs Abs: 1.9 10*3/uL (ref 0.7–4.0)
MCH: 28.4 pg (ref 26.0–34.0)
MCHC: 31.5 g/dL (ref 30.0–36.0)
MCV: 90 fL (ref 80.0–100.0)
Monocytes Absolute: 0.4 10*3/uL (ref 0.1–1.0)
Monocytes Relative: 6 %
Neutro Abs: 3.7 10*3/uL (ref 1.7–7.7)
Neutrophils Relative %: 61 %
Platelets: 383 10*3/uL (ref 150–400)
RBC: 3.1 MIL/uL — ABNORMAL LOW (ref 3.87–5.11)
RDW: 14.9 % (ref 11.5–15.5)
WBC: 6.1 10*3/uL (ref 4.0–10.5)
nRBC: 0 % (ref 0.0–0.2)

## 2022-10-31 LAB — RETICULOCYTES
Immature Retic Fract: 12.6 % (ref 2.3–15.9)
RBC.: 3.05 MIL/uL — ABNORMAL LOW (ref 3.87–5.11)
Retic Count, Absolute: 48.2 10*3/uL (ref 19.0–186.0)
Retic Ct Pct: 1.6 % (ref 0.4–3.1)

## 2022-10-31 LAB — COMPREHENSIVE METABOLIC PANEL
ALT: 11 U/L (ref 0–44)
AST: 16 U/L (ref 15–41)
Albumin: 3 g/dL — ABNORMAL LOW (ref 3.5–5.0)
Alkaline Phosphatase: 69 U/L (ref 38–126)
Anion gap: 9 (ref 5–15)
BUN: 19 mg/dL (ref 8–23)
CO2: 27 mmol/L (ref 22–32)
Calcium: 8.9 mg/dL (ref 8.9–10.3)
Chloride: 101 mmol/L (ref 98–111)
Creatinine, Ser: 0.88 mg/dL (ref 0.44–1.00)
GFR, Estimated: >60 mL/min (ref >60)
Glucose, Bld: 103 mg/dL — ABNORMAL HIGH (ref 70–99)
Potassium: 3.9 mmol/L (ref 3.5–5.1)
Sodium: 137 mmol/L (ref 135–145)
Total Bilirubin: <0.1 mg/dL — ABNORMAL LOW (ref 0.3–1.2)
Total Protein: 7.2 g/dL (ref 6.5–8.1)

## 2022-10-31 LAB — VITAMIN B12: Vitamin B-12: 1257 pg/mL — ABNORMAL HIGH (ref 180–914)

## 2022-10-31 LAB — IRON AND TIBC
Iron: 28 ug/dL (ref 28–170)
Saturation Ratios: 12 % (ref 10.4–31.8)
TIBC: 241 ug/dL — ABNORMAL LOW (ref 250–450)
UIBC: 213 ug/dL

## 2022-10-31 LAB — FOLATE: Folate: 16.1 ng/mL (ref >5.9)

## 2022-10-31 LAB — LACTATE DEHYDROGENASE: LDH: 103 U/L (ref 98–192)

## 2022-10-31 LAB — FERRITIN: Ferritin: 201 ng/mL (ref 11–307)

## 2022-11-01 ENCOUNTER — Encounter: Payer: Self-pay | Admitting: Oncology

## 2022-11-01 DIAGNOSIS — D509 Iron deficiency anemia, unspecified: Secondary | ICD-10-CM | POA: Insufficient documentation

## 2022-11-01 LAB — KAPPA/LAMBDA LIGHT CHAINS
Kappa free light chain: 38 mg/L — ABNORMAL HIGH (ref 3.3–19.4)
Kappa, lambda light chain ratio: 1.38 (ref 0.26–1.65)
Lambda free light chains: 27.5 mg/L — ABNORMAL HIGH (ref 5.7–26.3)

## 2022-11-01 NOTE — Progress Notes (Signed)
Hematology/Oncology Consult note [facility] Telephone:([phone] [phone]  Patient Care Team: Dale Lahoma, MD as PCP - General (Internal Medicine) Dale Metcalfe, MD (Internal Medicine) Lemar Livings Merrily Pew, MD (General Surgery)   Name of the patient: Jessica Wall  [id number]  09-15-1945    Reason for referral-anemia   Referring physician-Dr. Dale Yale  Date of visit: 11/01/22   History of presenting illness- Patient is a 76 year old female past medical history is significant for hypothyroidism hypertension and hyperlipidemia.  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  She is scheduled to undergo left hip replacement and is here for further evaluation of anemia.  Most recent CBC from 10/17/2022 showed an H&H of 10.1/30.7 which is gradually drifted down over the last 2 years.  Prior to that her hemoglobin was around 12.  Most recent iron studies showed a low iron saturation of 6.4% and low TIBC and normal ferritin of 237.  ECOG PS- 1  Pain scale- 3   Review of systems- Review of Systems  Constitutional:  Positive for malaise/fatigue. Negative for chills, fever and weight loss.  HENT:  Negative for congestion, ear discharge and nosebleeds.   Eyes:  Negative for blurred vision.  Respiratory:  Negative for cough, hemoptysis, sputum production, shortness of breath and wheezing.   Cardiovascular:  Negative for chest pain, palpitations, orthopnea and claudication.  Gastrointestinal:  Negative for abdominal pain, blood in stool, constipation, diarrhea, heartburn, melena, nausea and vomiting.  Genitourinary:  Negative for dysuria, flank pain, frequency, hematuria and urgency.  Musculoskeletal:  Positive for joint pain (Left hip pain). Negative for back pain and myalgias.  Skin:  Negative for rash.  Neurological:  Negative for dizziness, tingling, focal  weakness, seizures, weakness and headaches.  Endo/Heme/Allergies:  Does not bruise/bleed easily.  Psychiatric/Behavioral:  Negative for depression and suicidal ideas. The patient does not have insomnia.     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/31/2022   Abnormal MRA, brain 08/27/2021   Hyperglycemia 08/17/2021   Decreased GFR 06/15/2021   Temporal arteritis (HCC) 03/27/2021   Long term current use of systemic steroids 03/17/2021   Long-term use of immunosuppressant medication 03/17/2021   Headache 02/22/2021   Stress 02/22/2021   Weight loss 09/19/2020   Breast cancer screening 10/11/2019   Hypercholesterolemia 04/27/2018   Pre-op evaluation 11/21/2014   History of colonic polyps 11/15/2014   Esophagitis 11/15/2014   Smoking 05/23/2014   Health care maintenance 05/23/2014   Syncope 09/04/2012   Essential hypertension, benign 09/04/2012   Hypothyroidism 09/04/2012   Anemia 09/04/2012   Uterine fibroid 09/04/2012     Past Medical History:  Diagnosis Date   GERD (gastroesophageal reflux disease)    Goiter    s/p thyroidectomy   H/O syncope    Hypercholesterolemia    Hypothyroidism    Recurrent sinus infections    and allergy problems     Past Surgical History:  Procedure Laterality Date   ARTERY BIOPSY Right 03/13/2021   Procedure: BIOPSY TEMPORAL ARTERY;  Surgeon: Earline Mayotte, MD;  Location: [facility] ORS;  Service: General;  Laterality: Right;   BREAST BIOPSY  COLONOSCOPY WITH PROPOFOL N/A 11/03/2014   Procedure: COLONOSCOPY WITH PROPOFOL;  Surgeon: Scot Jun, MD;  Location: [facility] ENDOSCOPY;  Service: Endoscopy;  Laterality: N/A;   COLONOSCOPY WITH PROPOFOL N/A 02/12/2018   Procedure:  COLONOSCOPY WITH PROPOFOL;  Surgeon: Scot Jun, MD;  Location: [facility] ENDOSCOPY;  Service: Endoscopy;  Laterality: N/A;   DILATION AND CURETTAGE OF UTERUS  1980 & 1993   ESOPHAGOGASTRODUODENOSCOPY N/A 11/03/2014   Procedure: ESOPHAGOGASTRODUODENOSCOPY (EGD);  Surgeon: Scot Jun, MD;  Location: [facility] ENDOSCOPY;  Service: Endoscopy;  Laterality: N/A;   LAPAROSCOPIC APPENDECTOMY N/A 11/26/2014   Procedure: APPENDECTOMY LAPAROSCOPIC;  Surgeon: Earline Mayotte, MD;  Location: [facility] ORS;  Service: General;  Laterality: N/A;   thyroidectomy  1979   goiter   TUBAL LIGATION  1975    Social History   Socioeconomic History   Marital status: Married    Spouse name: Not on file   Number of children: Not on file   Years of education: Not on file   Highest education level: Not on file  Occupational History   Not on file  Tobacco Use   Smoking status: Former    Current packs/day: 0.00    Types: Cigarettes    Quit date: 04/16/2009    Years since quitting: 13.5   Smokeless tobacco: Never  Substance and Sexual Activity   Alcohol use: Yes    Alcohol/week: 3.0 - 4.0 standard drinks of alcohol    Types: 3 - 4 Standard drinks or equivalent per week    Comment: occ   Drug use: Yes    Types: Marijuana    Comment: marijuana every day   Sexual activity: Not on file  Other Topics Concern   Not on file  Social History Narrative   Not on file   Social Determinants of Health   Financial Resource Strain: Low Risk  (05/25/2022)   Overall Financial Resource Strain (CARDIA)    Difficulty of Paying Living Expenses: Not hard at all  Food Insecurity: No Food Insecurity (10/31/2022)   Hunger Vital Sign    Worried About Running Out of Food in the Last Year: Never true    Ran Out of Food in the Last Year: Never true  Transportation Needs: No Transportation Needs (10/31/2022)   PRAPARE - Administrator, Civil Service (Medical): No    Lack of Transportation (Non-Medical): No  Physical  Activity: Insufficiently Active (05/25/2022)   Exercise Vital Sign    Days of Exercise per Week: 3 days    Minutes of Exercise per Session: 30 min  Stress: No Stress Concern Present (05/25/2022)   [employer] of Occupational Health - Occupational Stress Questionnaire    Feeling of Stress : Not at all  Social Connections: Unknown (05/25/2022)   Social Connection and Isolation Panel [NHANES]    Frequency of Communication with Friends and Family: More than three times a week    Frequency of Social Gatherings with Friends and Family: More than three times a week    Attends Religious Services: Not on file    Active Member of Clubs or Organizations: Not on file    Attends Banker Meetings: Not on file    Marital Status: Not on file  Intimate Partner Violence: Not At Risk (10/31/2022)   Humiliation, Afraid, Rape, and Kick questionnaire    Fear of Current or Ex-Partner: No    Emotionally Abused: No    Physically Abused: No    Sexually Abused: No  Family History  Problem Relation Age of Onset   Tuberculosis Paternal Grandfather    Alzheimer's disease Mother    Goiter Mother    Cancer Brother    Cancer Brother    Cancer Brother    Cancer Brother      Current Outpatient Medications:    amLODipine (NORVASC) 2.5 MG tablet, Take 2.5 mg by mouth daily., Disp: , Rfl:    cyanocobalamin 1000 MCG tablet, Take 1,000 mcg by mouth daily., Disp: , Rfl:    ferrous sulfate 325 (65 FE) MG tablet, Take 325 mg by mouth daily with breakfast., Disp: , Rfl:    levothyroxine (SYNTHROID) 50 MCG tablet, Take 1 tablet (50 mcg total) by mouth daily., Disp: 90 tablet, Rfl: 1   rosuvastatin (CRESTOR) 10 MG tablet, TAKE 1 TABLET BY MOUTH EVERY DAY, Disp: 90 tablet, Rfl: 3   Physical exam:  Vitals:   10/31/22 1036 10/31/22 1050 10/31/22 1051 10/31/22 1052  BP: (!) 83/55 (!) 82/62 (!) 88/64 104/62  Pulse: 72 71 71 71  Resp: 18     Temp: (!) 96.4 F (35.8 C)     TempSrc: Tympanic     Weight:  113 lb 1.6 oz (51.3 kg)     Height: 5\' 3"  (1.6 m)      Physical Exam Cardiovascular:     Rate and Rhythm: Normal rate and regular rhythm.     Heart sounds: Normal heart sounds.  Pulmonary:     Effort: Pulmonary effort is normal.     Breath sounds: Normal breath sounds.  Abdominal:     General: Bowel sounds are normal.     Palpations: Abdomen is soft.  Skin:    General: Skin is warm and dry.  Neurological:     Mental Status: She is alert and oriented to person, place, and time.           Latest Ref Rng & Units 10/31/2022   11:34 AM  CMP  Glucose 70 - 99 mg/dL 578   BUN 8 - 23 mg/dL 19   Creatinine 4.69 - 1.00 mg/dL 6.29   Sodium 528 - 413 mmol/L 137   Potassium 3.5 - 5.1 mmol/L 3.9   Chloride 98 - 111 mmol/L 101   CO2 22 - 32 mmol/L 27   Calcium 8.9 - 10.3 mg/dL 8.9   Total Protein 6.5 - 8.1 g/dL 7.2   Total Bilirubin 0.3 - 1.2 mg/dL <2.4   Alkaline Phos 38 - 126 U/L 69   AST 15 - 41 U/L 16   ALT 0 - 44 U/L 11       Latest Ref Rng & Units 10/31/2022   11:34 AM  CBC  WBC 4.0 - 10.5 K/uL 6.1   Hemoglobin 12.0 - 15.0 g/dL 8.8   Hematocrit 40.1 - 46.0 % 27.9   Platelets 150 - 400 K/uL 383     Assessment and plan- Patient is a 77 y.o. female Referred for normocytic anemia  Patient's hemoglobin was close to 12 in 2022.  Thereafter she was diagnosed with giant cell arteritis and was on steroids for over a year.  Gradually her hemoglobin has drifted down to the tens.  Her most recent iron studies revealed normal ferritin with a low TIBC and low iron saturation indicative of anemia of chronic disease but given the low iron saturation I think it would be reasonable to offer her IV iron.  Based on her insurance we will have to give her Venofer x 5 doses.  Discussed risks and benefits of IV iron including all but not limited to possible risk of infusion reactions.  Patient understands and agrees to proceed as planned.  I will also do another anemia workup including B12 folate  reticulocyte count myeloma panel haptoglobin LDH and free light chains today.  CBC ferritin and iron studies in 2 months and see me thereafter.  Remains to be seen how much her anemia will improve with IV iron since there is a component of anemia of chronic disease contributing to her anemia as well.   Thank you for this kind referral and the opportunity to participate in the care of this  Patient   Visit Diagnosis 1. Normocytic anemia     Dr. Owens Shark, MD, MPH [facility] [phone] 11/01/2022

## 2022-11-02 LAB — HAPTOGLOBIN: Haptoglobin: 554 mg/dL — ABNORMAL HIGH (ref 42–346)

## 2022-11-04 LAB — MULTIPLE MYELOMA PANEL, SERUM
Albumin SerPl Elph-Mcnc: 2.7 g/dL — ABNORMAL LOW (ref 2.9–4.4)
Albumin/Glob SerPl: 0.8 (ref 0.7–1.7)
Alpha 1: 0.4 g/dL (ref 0.0–0.4)
Alpha2 Glob SerPl Elph-Mcnc: 1.2 g/dL — ABNORMAL HIGH (ref 0.4–1.0)
B-Globulin SerPl Elph-Mcnc: 1.1 g/dL (ref 0.7–1.3)
Gamma Glob SerPl Elph-Mcnc: 1.1 g/dL (ref 0.4–1.8)
Globulin, Total: 3.8 g/dL (ref 2.2–3.9)
IgA: 200 mg/dL (ref 64–422)
IgG (Immunoglobin G), Serum: 1056 mg/dL (ref 586–1602)
IgM (Immunoglobulin M), Srm: 139 mg/dL (ref 26–217)
Total Protein ELP: 6.5 g/dL (ref 6.0–8.5)

## 2022-11-05 ENCOUNTER — Encounter: Payer: Self-pay | Admitting: Internal Medicine

## 2022-11-05 DIAGNOSIS — R5382 Chronic fatigue, unspecified: Secondary | ICD-10-CM | POA: Diagnosis not present

## 2022-11-05 DIAGNOSIS — D638 Anemia in other chronic diseases classified elsewhere: Secondary | ICD-10-CM | POA: Diagnosis not present

## 2022-11-05 DIAGNOSIS — R634 Abnormal weight loss: Secondary | ICD-10-CM | POA: Diagnosis not present

## 2022-11-05 DIAGNOSIS — D509 Iron deficiency anemia, unspecified: Secondary | ICD-10-CM | POA: Diagnosis not present

## 2022-11-05 DIAGNOSIS — R630 Anorexia: Secondary | ICD-10-CM | POA: Diagnosis not present

## 2022-11-05 DIAGNOSIS — Z7952 Long term (current) use of systemic steroids: Secondary | ICD-10-CM | POA: Diagnosis not present

## 2022-11-05 NOTE — Telephone Encounter (Signed)
LMTCB

## 2022-11-06 DIAGNOSIS — M87052 Idiopathic aseptic necrosis of left femur: Secondary | ICD-10-CM | POA: Diagnosis not present

## 2022-11-06 NOTE — Telephone Encounter (Signed)
LMTCB

## 2022-11-07 ENCOUNTER — Inpatient Hospital Stay: Payer: No Typology Code available for payment source

## 2022-11-07 VITALS — BP 99/61 | HR 70 | Temp 97.2°F | Resp 18

## 2022-11-07 DIAGNOSIS — D649 Anemia, unspecified: Secondary | ICD-10-CM | POA: Diagnosis not present

## 2022-11-07 DIAGNOSIS — D508 Other iron deficiency anemias: Secondary | ICD-10-CM

## 2022-11-07 MED ORDER — SODIUM CHLORIDE 0.9 % IV SOLN
200.0000 mg | INTRAVENOUS | Status: DC
  Administered 2022-11-07: 200 mg via INTRAVENOUS
  Filled 2022-11-07: qty 200

## 2022-11-07 MED ORDER — SODIUM CHLORIDE 0.9 % IV SOLN
Freq: Once | INTRAVENOUS | Status: AC
  Filled 2022-11-07: qty 250

## 2022-11-09 ENCOUNTER — Other Ambulatory Visit: Payer: Self-pay | Admitting: Gastroenterology

## 2022-11-09 DIAGNOSIS — R634 Abnormal weight loss: Secondary | ICD-10-CM

## 2022-11-09 DIAGNOSIS — R630 Anorexia: Secondary | ICD-10-CM

## 2022-11-09 DIAGNOSIS — D638 Anemia in other chronic diseases classified elsewhere: Secondary | ICD-10-CM

## 2022-11-09 DIAGNOSIS — R5382 Chronic fatigue, unspecified: Secondary | ICD-10-CM

## 2022-11-09 DIAGNOSIS — D509 Iron deficiency anemia, unspecified: Secondary | ICD-10-CM

## 2022-11-13 ENCOUNTER — Inpatient Hospital Stay: Payer: No Typology Code available for payment source

## 2022-11-13 VITALS — BP 116/58 | HR 82 | Temp 98.5°F

## 2022-11-13 DIAGNOSIS — D649 Anemia, unspecified: Secondary | ICD-10-CM | POA: Diagnosis not present

## 2022-11-13 DIAGNOSIS — D508 Other iron deficiency anemias: Secondary | ICD-10-CM

## 2022-11-13 MED ORDER — SODIUM CHLORIDE 0.9 % IV SOLN
Freq: Once | INTRAVENOUS | Status: AC
  Filled 2022-11-13: qty 250

## 2022-11-13 MED ORDER — SODIUM CHLORIDE 0.9 % IV SOLN
200.0000 mg | INTRAVENOUS | Status: AC
  Administered 2022-11-13: 200 mg via INTRAVENOUS
  Filled 2022-11-13: qty 200

## 2022-11-14 MED FILL — Iron Sucrose Inj 20 MG/ML (Fe Equiv): INTRAVENOUS | Qty: 10 | Status: AC

## 2022-11-15 ENCOUNTER — Inpatient Hospital Stay: Payer: No Typology Code available for payment source | Attending: Oncology

## 2022-11-15 VITALS — BP 94/52 | HR 54 | Temp 97.5°F | Resp 18

## 2022-11-15 DIAGNOSIS — D509 Iron deficiency anemia, unspecified: Secondary | ICD-10-CM | POA: Insufficient documentation

## 2022-11-15 DIAGNOSIS — D508 Other iron deficiency anemias: Secondary | ICD-10-CM

## 2022-11-15 MED ORDER — SODIUM CHLORIDE 0.9 % IV SOLN
200.0000 mg | INTRAVENOUS | Status: DC
  Administered 2022-11-15: 200 mg via INTRAVENOUS
  Filled 2022-11-15: qty 200

## 2022-11-15 MED ORDER — SODIUM CHLORIDE 0.9 % IV SOLN
Freq: Once | INTRAVENOUS | Status: AC
  Filled 2022-11-15: qty 250

## 2022-11-19 ENCOUNTER — Inpatient Hospital Stay: Payer: No Typology Code available for payment source

## 2022-11-19 VITALS — BP 97/53 | HR 72 | Temp 97.4°F | Resp 16

## 2022-11-19 DIAGNOSIS — D509 Iron deficiency anemia, unspecified: Secondary | ICD-10-CM | POA: Diagnosis not present

## 2022-11-19 DIAGNOSIS — D508 Other iron deficiency anemias: Secondary | ICD-10-CM

## 2022-11-19 MED ORDER — SODIUM CHLORIDE 0.9 % IV SOLN
200.0000 mg | INTRAVENOUS | Status: DC
  Administered 2022-11-19: 200 mg via INTRAVENOUS
  Filled 2022-11-19: qty 200

## 2022-11-19 MED ORDER — SODIUM CHLORIDE 0.9 % IV SOLN
Freq: Once | INTRAVENOUS | Status: AC
  Filled 2022-11-19: qty 250

## 2022-11-20 MED FILL — Iron Sucrose Inj 20 MG/ML (Fe Equiv): INTRAVENOUS | Qty: 10 | Status: AC

## 2022-11-21 ENCOUNTER — Inpatient Hospital Stay: Payer: No Typology Code available for payment source

## 2022-11-21 VITALS — BP 95/70 | HR 68 | Temp 98.2°F | Resp 18

## 2022-11-21 DIAGNOSIS — D509 Iron deficiency anemia, unspecified: Secondary | ICD-10-CM | POA: Diagnosis not present

## 2022-11-21 DIAGNOSIS — D508 Other iron deficiency anemias: Secondary | ICD-10-CM

## 2022-11-21 MED ORDER — SODIUM CHLORIDE 0.9 % IV SOLN
Freq: Once | INTRAVENOUS | Status: AC
  Filled 2022-11-21: qty 250

## 2022-11-21 MED ORDER — SODIUM CHLORIDE 0.9 % IV SOLN
200.0000 mg | INTRAVENOUS | Status: AC
  Administered 2022-11-21: 200 mg via INTRAVENOUS
  Filled 2022-11-21: qty 200

## 2022-11-21 NOTE — Patient Instructions (Signed)
Iron Sucrose Injection What is this medication? IRON SUCROSE (EYE ern SOO krose) treats low levels of iron (iron deficiency anemia) in people with kidney disease. Iron is a mineral that plays an important role in making red blood cells, which carry oxygen from your lungs to the rest of your body. This medicine may be used for other purposes; ask your health care provider or pharmacist if you have questions. COMMON BRAND NAME(S): Venofer What should I tell my care team before I take this medication? They need to know if you have any of these conditions: Anemia not caused by low iron levels Heart disease High levels of iron in the blood Kidney disease Liver disease An unusual or allergic reaction to iron, other medications, foods, dyes, or preservatives Pregnant or trying to get pregnant Breastfeeding How should I use this medication? This medication is for infusion into a vein. It is given in a hospital or clinic setting. Talk to your care team about the use of this medication in children. While this medication may be prescribed for children as young as 2 years for selected conditions, precautions do apply. Overdosage: If you think you have taken too much of this medicine contact a poison control center or emergency room at once. NOTE: This medicine is only for you. Do not share this medicine with others. What if I miss a dose? Keep appointments for follow-up doses. It is important not to miss your dose. Call your care team if you are unable to keep an appointment. What may interact with this medication? Do not take this medication with any of the following: Deferoxamine Dimercaprol Other iron products This medication may also interact with the following: Chloramphenicol Deferasirox This list may not describe all possible interactions. Give your health care provider a list of all the medicines, herbs, non-prescription drugs, or dietary supplements you use. Also tell them if you smoke,  drink alcohol, or use illegal drugs. Some items may interact with your medicine. What should I watch for while using this medication? Visit your care team regularly. Tell your care team if your symptoms do not start to get better or if they get worse. You may need blood work done while you are taking this medication. You may need to follow a special diet. Talk to your care team. Foods that contain iron include: whole grains/cereals, dried fruits, beans, or peas, leafy green vegetables, and organ meats (liver, kidney). What side effects may I notice from receiving this medication? Side effects that you should report to your care team as soon as possible: Allergic reactions--skin rash, itching, hives, swelling of the face, lips, tongue, or throat Low blood pressure--dizziness, feeling faint or lightheaded, blurry vision Shortness of breath Side effects that usually do not require medical attention (report to your care team if they continue or are bothersome): Flushing Headache Joint pain Muscle pain Nausea Pain, redness, or irritation at injection site This list may not describe all possible side effects. Call your doctor for medical advice about side effects. You may report side effects to FDA at 1-800-FDA-1088. Where should I keep my medication? This medication is given in a hospital or clinic. It will not be stored at home. NOTE: This sheet is a summary. It may not cover all possible information. If you have questions about this medicine, talk to your doctor, pharmacist, or health care provider.  2024 Elsevier/Gold Standard (2022-09-07 00:00:00)

## 2022-11-22 ENCOUNTER — Ambulatory Visit: Payer: No Typology Code available for payment source

## 2022-11-29 ENCOUNTER — Encounter (INDEPENDENT_AMBULATORY_CARE_PROVIDER_SITE_OTHER): Payer: Self-pay

## 2022-12-04 ENCOUNTER — Ambulatory Visit
Admission: RE | Admit: 2022-12-04 | Discharge: 2022-12-04 | Disposition: A | Discharge status: Home / Self Care | Payer: No Typology Code available for payment source | Source: Ambulatory Visit | Attending: Gastroenterology | Admitting: Gastroenterology

## 2022-12-04 DIAGNOSIS — R634 Abnormal weight loss: Secondary | ICD-10-CM | POA: Insufficient documentation

## 2022-12-04 DIAGNOSIS — K8020 Calculus of gallbladder without cholecystitis without obstruction: Secondary | ICD-10-CM | POA: Diagnosis not present

## 2022-12-04 DIAGNOSIS — N281 Cyst of kidney, acquired: Secondary | ICD-10-CM | POA: Diagnosis not present

## 2022-12-04 DIAGNOSIS — K7689 Other specified diseases of liver: Secondary | ICD-10-CM | POA: Diagnosis not present

## 2022-12-04 DIAGNOSIS — R5382 Chronic fatigue, unspecified: Secondary | ICD-10-CM | POA: Insufficient documentation

## 2022-12-04 DIAGNOSIS — R630 Anorexia: Secondary | ICD-10-CM | POA: Diagnosis not present

## 2022-12-04 DIAGNOSIS — D509 Iron deficiency anemia, unspecified: Secondary | ICD-10-CM | POA: Insufficient documentation

## 2022-12-04 DIAGNOSIS — D638 Anemia in other chronic diseases classified elsewhere: Secondary | ICD-10-CM | POA: Diagnosis not present

## 2022-12-04 MED ORDER — IOHEXOL 300 MG/ML  SOLN
100.0000 mL | Freq: Once | INTRAMUSCULAR | Status: AC | PRN
  Administered 2022-12-04: 100 mL via INTRAVENOUS

## 2022-12-18 ENCOUNTER — Encounter: Payer: Self-pay | Admitting: Internal Medicine

## 2022-12-19 ENCOUNTER — Encounter: Payer: Self-pay | Admitting: Internal Medicine

## 2022-12-19 ENCOUNTER — Ambulatory Visit: Payer: No Typology Code available for payment source | Admitting: Internal Medicine

## 2022-12-19 VITALS — BP 126/74 | HR 60 | Temp 97.9°F | Resp 16 | Ht 63.0 in | Wt 112.0 lb

## 2022-12-19 DIAGNOSIS — E1165 Type 2 diabetes mellitus with hyperglycemia: Secondary | ICD-10-CM

## 2022-12-19 DIAGNOSIS — R9089 Other abnormal findings on diagnostic imaging of central nervous system: Secondary | ICD-10-CM | POA: Diagnosis not present

## 2022-12-19 DIAGNOSIS — Z01818 Encounter for other preprocedural examination: Secondary | ICD-10-CM | POA: Diagnosis not present

## 2022-12-19 DIAGNOSIS — I10 Essential (primary) hypertension: Secondary | ICD-10-CM

## 2022-12-19 DIAGNOSIS — D649 Anemia, unspecified: Secondary | ICD-10-CM

## 2022-12-19 DIAGNOSIS — M316 Other giant cell arteritis: Secondary | ICD-10-CM

## 2022-12-19 DIAGNOSIS — R519 Headache, unspecified: Secondary | ICD-10-CM

## 2022-12-19 DIAGNOSIS — F439 Reaction to severe stress, unspecified: Secondary | ICD-10-CM

## 2022-12-19 DIAGNOSIS — E039 Hypothyroidism, unspecified: Secondary | ICD-10-CM

## 2022-12-19 DIAGNOSIS — E7800 Pure hypercholesterolemia, unspecified: Secondary | ICD-10-CM

## 2022-12-19 DIAGNOSIS — M8700 Idiopathic aseptic necrosis of unspecified bone: Secondary | ICD-10-CM | POA: Diagnosis not present

## 2022-12-19 DIAGNOSIS — D509 Iron deficiency anemia, unspecified: Secondary | ICD-10-CM | POA: Diagnosis not present

## 2022-12-19 LAB — BASIC METABOLIC PANEL
BUN: 14 mg/dL (ref 6–23)
CO2: 30 meq/L (ref 19–32)
Calcium: 9.4 mg/dL (ref 8.4–10.5)
Chloride: 99 meq/L (ref 96–112)
Creatinine, Ser: 0.87 mg/dL (ref 0.40–1.20)
GFR: 65.15 mL/min (ref >60.00)
Glucose, Bld: 82 mg/dL (ref 70–99)
Potassium: 4.2 meq/L (ref 3.5–5.1)
Sodium: 136 meq/L (ref 135–145)

## 2022-12-19 LAB — HEPATIC FUNCTION PANEL
ALT: 7 U/L (ref 0–35)
AST: 12 U/L (ref 0–37)
Albumin: 3.6 g/dL (ref 3.5–5.2)
Alkaline Phosphatase: 83 U/L (ref 39–117)
Bilirubin, Direct: 0.1 mg/dL (ref 0.0–0.3)
Total Bilirubin: 0.3 mg/dL (ref 0.2–1.2)
Total Protein: 7.7 g/dL (ref 6.0–8.3)

## 2022-12-19 LAB — LIPID PANEL
Cholesterol: 144 mg/dL (ref 0–200)
HDL: 69.2 mg/dL (ref >39.00)
LDL Cholesterol: 60 mg/dL (ref 0–99)
NonHDL: 74.38
Total CHOL/HDL Ratio: 2
Triglycerides: 73 mg/dL (ref 0.0–149.0)
VLDL: 14.6 mg/dL (ref 0.0–40.0)

## 2022-12-19 LAB — CBC WITH DIFFERENTIAL/PLATELET
Basophils Absolute: 0.1 10*3/uL (ref 0.0–0.1)
Basophils Relative: 1 % (ref 0.0–3.0)
Eosinophils Absolute: 0.1 10*3/uL (ref 0.0–0.7)
Eosinophils Relative: 1.5 % (ref 0.0–5.0)
HCT: 33.5 % — ABNORMAL LOW (ref 36.0–46.0)
Hemoglobin: 10.6 g/dL — ABNORMAL LOW (ref 12.0–15.0)
Lymphocytes Relative: 44.4 % (ref 12.0–46.0)
Lymphs Abs: 3 10*3/uL (ref 0.7–4.0)
MCHC: 31.7 g/dL (ref 30.0–36.0)
MCV: 90 fl (ref 78.0–100.0)
Monocytes Absolute: 0.5 10*3/uL (ref 0.1–1.0)
Monocytes Relative: 7.1 % (ref 3.0–12.0)
Neutro Abs: 3.1 10*3/uL (ref 1.4–7.7)
Neutrophils Relative %: 46 % (ref 43.0–77.0)
Platelets: 402 10*3/uL — ABNORMAL HIGH (ref 150.0–400.0)
RBC: 3.72 Mil/uL — ABNORMAL LOW (ref 3.87–5.11)
RDW: 19.3 % — ABNORMAL HIGH (ref 11.5–15.5)
WBC: 6.8 10*3/uL (ref 4.0–10.5)

## 2022-12-19 LAB — IBC + FERRITIN
Ferritin: 583.9 ng/mL — ABNORMAL HIGH (ref 10.0–291.0)
Iron: 34 ug/dL — ABNORMAL LOW (ref 42–145)
Saturation Ratios: 14.5 % — ABNORMAL LOW (ref 20.0–50.0)
TIBC: 235.2 ug/dL — ABNORMAL LOW (ref 250.0–450.0)
Transferrin: 168 mg/dL — ABNORMAL LOW (ref 212.0–360.0)

## 2022-12-19 LAB — HEMOGLOBIN A1C: Hgb A1c MFr Bld: 6 % (ref 4.6–6.5)

## 2022-12-19 NOTE — Patient Instructions (Signed)
Stop amlodipine.

## 2022-12-19 NOTE — Progress Notes (Signed)
Subjective:    [name], female    DOB: 1945-05-08, 77 y.o.   MRN: [id number]  Patient here for  Chief Complaint  Patient presents with   Pre-op Exam         HPI Here for pre op evaluation. 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 Increased pain in her hip. Limiting activity. Saw ortho.  Planning for left THA. Saw hematology 10/31/22.  Receiving IV iron infusions.  Saw GI.  Recommended CT abd/pelvis.  Luminal evaluation deferred. CXR negatvie.  CT scan revealed no acute intra-abdominal or pelvic pathology.  Gallstones. She feels better overall.  Eating better.  Appetite better.  No nausea or vomiting.  No chest pain or sob.  No cough or congestion.  No acid reflux.  No abdominal pain or bowel change.  No dizziness or light headedness.     Past Medical History:  Diagnosis Date   GERD (gastroesophageal reflux disease)    Goiter    s/p thyroidectomy   H/O syncope    Hypercholesterolemia    Hypothyroidism    Recurrent sinus infections    and allergy problems   Past Surgical History:  Procedure Laterality Date   ARTERY BIOPSY Right 03/13/2021   Procedure: BIOPSY TEMPORAL ARTERY;  Surgeon: Earline Mayotte, MD;  Location: [facility] ORS;  Service: General;  Laterality: Right;   BREAST BIOPSY     COLONOSCOPY WITH PROPOFOL N/A 11/03/2014   Procedure: COLONOSCOPY WITH PROPOFOL;  Surgeon: Scot Jun, MD;  Location: [facility] ENDOSCOPY;  Service: Endoscopy;  Laterality: N/A;   COLONOSCOPY WITH PROPOFOL N/A 02/12/2018   Procedure: COLONOSCOPY WITH PROPOFOL;  Surgeon: Scot Jun, MD;  Location: [facility] ENDOSCOPY;  Service: Endoscopy;  Laterality: N/A;   DILATION AND CURETTAGE OF UTERUS  1980 & 1993   ESOPHAGOGASTRODUODENOSCOPY N/A 11/03/2014   Procedure: ESOPHAGOGASTRODUODENOSCOPY (EGD);  Surgeon: Scot Jun, MD;  Location: [facility] ENDOSCOPY;  Service: Endoscopy;  Laterality: N/A;   LAPAROSCOPIC  APPENDECTOMY N/A 11/26/2014   Procedure: APPENDECTOMY LAPAROSCOPIC;  Surgeon: Earline Mayotte, MD;  Location: [facility] ORS;  Service: General;  Laterality: N/A;   thyroidectomy  1979   goiter   TUBAL LIGATION  1975   Family History  Problem Relation Age of Onset   Tuberculosis Paternal Grandfather    Alzheimer's disease Mother    Goiter Mother    Cancer Brother    Cancer Brother    Cancer Brother    Cancer Brother    Social History   Socioeconomic History   Marital status: Married    Spouse name: Not on file   Number of children: Not on file   Years of education: Not on file   Highest education level: Not on file  Occupational History   Not on file  Tobacco Use   Smoking status: Former    Current packs/day: 0.00    Types: Cigarettes    Quit date: 04/16/2009    Years since quitting: 13.6   Smokeless tobacco: Never  Substance and Sexual Activity   Alcohol use: Yes    Alcohol/week: 3.0 - 4.0 standard drinks of alcohol    Types: 3 - 4 Standard drinks or equivalent per week    Comment: occ   Drug use: Yes    Types: Marijuana    Comment: marijuana every day   Sexual activity: Not on file  Other Topics Concern   Not on file  Social History Narrative  Not on file   Social Determinants of Health   Financial Resource Strain: Low Risk  (05/25/2022)   Overall Financial Resource Strain (CARDIA)    Difficulty of Paying Living Expenses: Not hard at all  Food Insecurity: No Food Insecurity (10/31/2022)   Hunger Vital Sign    Worried About Running Out of Food in the Last Year: Never true    Ran Out of Food in the Last Year: Never true  Transportation Needs: No Transportation Needs (10/31/2022)   PRAPARE - Administrator, Civil Service (Medical): No    Lack of Transportation (Non-Medical): No  Physical Activity: Insufficiently Active (05/25/2022)   Exercise Vital Sign    Days of Exercise per Week: 3 days    Minutes of Exercise per Session: 30 min  Stress: No Stress  Concern Present (05/25/2022)   [employer] of Occupational Health - Occupational Stress Questionnaire    Feeling of Stress : Not at all  Social Connections: Unknown (05/25/2022)   Social Connection and Isolation Panel [NHANES]    Frequency of Communication with Friends and Family: More than three times a week    Frequency of Social Gatherings with Friends and Family: More than three times a week    Attends Religious Services: Not on Marketing executive or Organizations: Not on file    Attends Banker Meetings: Not on file    Marital Status: Not on file     Review of Systems  Constitutional:  Negative for appetite change and unexpected weight change.  HENT:  Negative for congestion and sinus pressure.   Respiratory:  Negative for cough, chest tightness and shortness of breath.   Cardiovascular:  Negative for chest pain, palpitations and leg swelling.  Gastrointestinal:  Negative for abdominal pain, diarrhea, nausea and vomiting.  Genitourinary:  Negative for difficulty urinating and dysuria.  Musculoskeletal:  Negative for joint swelling and myalgias.       Hip pain as outlined.   Skin:  Negative for color change and rash.  Neurological:  Negative for dizziness, light-headedness and headaches.  Psychiatric/Behavioral:  Negative for agitation and dysphoric mood.        Objective:     BP 126/74   Pulse 60   Temp 97.9 F (36.6 C)   Resp 16   Ht 5\' 3"  (1.6 m)   Wt 112 lb (50.8 kg)   SpO2 99%   BMI 19.84 kg/m  Wt Readings from Last 3 Encounters:  12/19/22 112 lb (50.8 kg)  10/31/22 113 lb 1.6 oz (51.3 kg)  10/17/22 113 lb 6.4 oz (51.4 kg)    Physical Exam Vitals reviewed.  Constitutional:      General: She is not in acute distress.    Appearance: Normal appearance.  HENT:     Head: Normocephalic and atraumatic.     Right Ear: External ear normal.     Left Ear: External ear normal.  Eyes:     General: No scleral icterus.       Right eye:  No discharge.        Left eye: No discharge.     Conjunctiva/sclera: Conjunctivae normal.  Neck:     Thyroid: No thyromegaly.  Cardiovascular:     Rate and Rhythm: Normal rate and regular rhythm.  Pulmonary:     Effort: No respiratory distress.     Breath sounds: Normal breath sounds. No wheezing.  Abdominal:     General: Bowel sounds are normal.  Palpations: Abdomen is soft.     Tenderness: There is no abdominal tenderness.  Musculoskeletal:        General: No swelling or tenderness.     Cervical back: Neck supple. No tenderness.  Lymphadenopathy:     Cervical: No cervical adenopathy.  Skin:    Findings: No erythema or rash.  Neurological:     Mental Status: She is alert.  Psychiatric:        Mood and Affect: Mood normal.        Behavior: Behavior normal.      Outpatient Encounter Medications as of 12/19/2022  Medication Sig   cyanocobalamin 1000 MCG tablet Take 1,000 mcg by mouth daily.   ferrous sulfate 325 (65 FE) MG tablet Take 325 mg by mouth daily with breakfast.   levothyroxine (SYNTHROID) 50 MCG tablet Take 1 tablet (50 mcg total) by mouth daily.   rosuvastatin (CRESTOR) 10 MG tablet TAKE 1 TABLET BY MOUTH EVERY DAY   [DISCONTINUED] amLODipine (NORVASC) 2.5 MG tablet Take 2.5 mg by mouth daily.   Facility-Administered Encounter Medications as of 12/19/2022  Medication   iron sucrose (VENOFER) 200 mg in sodium chloride 0.9 % 100 mL IVPB     Lab Results  Component Value Date   WBC 6.8 12/19/2022   HGB 10.6 (L) 12/19/2022   HCT 33.5 (L) 12/19/2022   PLT 402.0 (H) 12/19/2022   GLUCOSE 82 12/19/2022   CHOL 144 12/19/2022   TRIG 73.0 12/19/2022   HDL 69.20 12/19/2022   LDLCALC 60 12/19/2022   ALT 7 12/19/2022   AST 12 12/19/2022   NA 136 12/19/2022   K 4.2 12/19/2022   CL 99 12/19/2022   CREATININE 0.87 12/19/2022   BUN 14 12/19/2022   CO2 30 12/19/2022   TSH 3.10 09/18/2022   INR 1.2 06/07/2021   HGBA1C 6.0 12/19/2022    CT ABDOMEN PELVIS W  CONTRAST  Result Date: 12/09/2022 CLINICAL DATA:  Iron deficiency anemia and unintentional weight loss. EXAM: CT ABDOMEN AND PELVIS WITH CONTRAST TECHNIQUE: Multidetector CT imaging of the abdomen and pelvis was performed using the standard protocol following bolus administration of intravenous contrast. RADIATION DOSE REDUCTION: This exam was performed according to the departmental dose-optimization program which includes automated exposure control, adjustment of the mA and/or kV according to patient size and/or use of iterative reconstruction technique. CONTRAST:  OMNIPAQUE IOHEXOL 300 MG/ML  SOLN COMPARISON:  None Available. FINDINGS: Lower chest: The visualized lung bases are clear. No intra-abdominal free air or free fluid. Hepatobiliary: Multiple liver cysts and additional subcentimeter hypodense lesions which are too small to characterize. No biliary ductal dilatation. Small gallstones. No pericholecystic fluid or evidence of acute cholecystitis by CT. Pancreas: Unremarkable. No pancreatic ductal dilatation or surrounding inflammatory changes. Spleen: Normal in size without focal abnormality. Adrenals/Urinary Tract: The adrenal glands are unremarkable. There is a 3.5 cm left renal inferior pole cyst and additional subcentimeter hypodense lesions which are too small to characterize. There is no hydronephrosis on either side. There is symmetric enhancement and excretion of contrast by both kidneys. The urinary bladder is grossly unremarkable. Stomach/Bowel: Oral contrast opacifies several loops of small bowel and colon. There is no bowel obstruction or active inflammation. The appendix is not visualized with certainty. No inflammatory changes identified in the right lower quadrant. Vascular/Lymphatic: Moderate aortoiliac atherosclerotic disease. The IVC is unremarkable. No portal venous gas. There is no adenopathy. Reproductive: The uterus is anteverted. Calcified uterine fibroid. No adnexal masses.  Other: None Musculoskeletal: Degenerative  changes of the spine. 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 IMPRESSION: 1. No acute intra-abdominal or pelvic pathology. 2. Cholelithiasis. 3. 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 4.  Aortic Atherosclerosis (ICD10-I70.0). Electronically Signed   By: Elgie Collard M.D.   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 Increased pain in her hip. Limiting activity. Saw ortho.  Planning for left THA.   Type 2 diabetes mellitus with hyperglycemia, without long-term current use of insulin (HCC) Assessment & Plan: Follow met b and A1c.  Off prednisone.   Orders: -     Hemoglobin A1c  Essential hypertension, benign Assessment & Plan: Blood pressure low on exam today - low on my check.  90/68 right arm and 88/62 left arm.  Currently without symptoms.  No dizziness or light headedness.  No pain, except for left hip pain.  Will stop amlodipine.  Get her back in for f/u with me within one week. Further treatment evaluation pending response to stopping amlodipine.    Orders: -     Basic metabolic panel  Hypercholesterolemia Assessment & Plan: Continue crestor.  Follow lipid panel and liver function tests.   Lab Results  Component Value Date   CHOL 144 12/19/2022   HDL 69.20 12/19/2022   LDLCALC 60 12/19/2022   TRIG 73.0 12/19/2022   CHOLHDL 2 12/19/2022    Orders: -     Hepatic function panel -     Lipid panel  Hypothyroidism, unspecified type Assessment & Plan: On synthroid.  Follow tsh.    Pre-op exam -     EKG 12-Lead  Abnormal MRA, brain Assessment & Plan: With recent headache, MRA brain  performed during w/up.  MRA - Mildly motion degraded exam. No intracranial large vessel occlusion or proximal high-grade arterial stenosis.  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 Both posterior cerebral arteries demonstrate distal branch atherosclerotic irregularity. 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 Apparent 1-2 mm broad-based vascular protrusion arising from the supraclinoid right ICA, which may reflect a small aneurysm or artifact related to motion.  Had referred her to neurology.  It appears they tried to schedule an appt.  No headache currently. She previously declined further w/up.     Nonintractable headache, unspecified chronicity pattern, unspecified headache type Assessment & Plan: No headache.  No visual disturbance.  MRA as outlined.    Iron deficiency anemia, unspecified iron deficiency anemia type Assessment & Plan: aw hematology 10/31/22.  Receiving IV iron infusions. Check cbc and iron studies today given upcoming surgery.  Saw GI.  CT - no acute intraabdominal abnormality.  Deferred colonoscopy and EGD.    Pre-op evaluation Assessment & Plan: Here for pre op - planning left THA 01/03/23.  EKG - SR/SB ventricular rate 58 - short PR - no acute ischemic changes.  No chest pain or sob.  Blood pressure as outlined.  Low today on exam.  Will stop amlodipine.  Get her back in soon to reassess.  Need to confirm stable prior to surgery.  Recheck cbc today.     Stress Assessment & Plan: Increased stress with current medical issues.  Discussed.  Does not feel needs further intervention at this time.  Doing better. Follow.    Temporal arteritis (HCC) Assessment & Plan: Followed by rheumatology.   Off prednisone.  Doing well off.  Follow.       Dale [place], MD

## 2022-12-19 NOTE — Telephone Encounter (Signed)
Today during check out, provider notes stated she needs to see pt in a week, however, theres no available opening for me to put pt in. Please advice. Pt stated she will like a call back with her appt info.

## 2022-12-19 NOTE — Telephone Encounter (Signed)
Pt scheduled  

## 2022-12-20 ENCOUNTER — Other Ambulatory Visit: Payer: Self-pay | Admitting: Orthopedic Surgery

## 2022-12-21 ENCOUNTER — Telehealth: Payer: Self-pay

## 2022-12-21 NOTE — Telephone Encounter (Signed)
LMTCB in regards to lab results.  

## 2022-12-21 NOTE — Telephone Encounter (Signed)
Noted  

## 2022-12-21 NOTE — Telephone Encounter (Signed)
Pt called back and I read the note and she verbalized understanding

## 2022-12-21 NOTE — Telephone Encounter (Signed)
-----   Message from [facility] [facility] sent at 12/21/2022  5:50 AM EDT ----- Notify - hgb has improved.  Continue f/u with hematology (Dr Smith Robert). Cholesterol levels look good. Overall sugar control improved. Liver function tests wnl.

## 2022-12-23 ENCOUNTER — Encounter: Payer: Self-pay | Admitting: Internal Medicine

## 2022-12-23 NOTE — Assessment & Plan Note (Signed)
On synthroid.  Follow tsh.   

## 2022-12-23 NOTE — Assessment & Plan Note (Signed)
No headache.  No visual disturbance.  MRA as outlined.

## 2022-12-23 NOTE — Assessment & Plan Note (Signed)
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 Increased pain in her hip. Limiting activity. Saw ortho.  Planning for left THA.

## 2022-12-23 NOTE — Assessment & Plan Note (Addendum)
aw hematology 10/31/22.  Receiving IV iron infusions. Check cbc and iron studies today given upcoming surgery.  Saw GI.  CT - no acute intraabdominal abnormality.  Deferred colonoscopy and EGD.

## 2022-12-23 NOTE — Assessment & Plan Note (Signed)
With recent headache, MRA brain performed during w/up.  MRA - Mildly motion degraded exam. No intracranial large vessel occlusion or proximal high-grade arterial stenosis.  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 Both posterior cerebral arteries demonstrate distal branch atherosclerotic irregularity. 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 Apparent 1-2 mm broad-based vascular protrusion arising from the supraclinoid right ICA, which may reflect a small aneurysm or artifact related to motion.  Had referred her to neurology.  It appears they tried to schedule an appt.  No headache currently. She previously declined further w/up.

## 2022-12-23 NOTE — Assessment & Plan Note (Signed)
Followed by rheumatology.   Off prednisone.  Doing well off.  Follow.

## 2022-12-23 NOTE — Assessment & Plan Note (Signed)
Continue crestor.  Follow lipid panel and liver function tests.   Lab Results  Component Value Date   CHOL 144 12/19/2022   HDL 69.20 12/19/2022   LDLCALC 60 12/19/2022   TRIG 73.0 12/19/2022   CHOLHDL 2 12/19/2022

## 2022-12-23 NOTE — Assessment & Plan Note (Signed)
Follow met b and A1c.  Off prednisone.

## 2022-12-23 NOTE — Assessment & Plan Note (Signed)
Blood pressure low on exam today - low on my check.  90/68 right arm and 88/62 left arm.  Currently without symptoms.  No dizziness or light headedness.  No pain, except for left hip pain.  Will stop amlodipine.  Get her back in for f/u with me within one week. Further treatment evaluation pending response to stopping amlodipine.

## 2022-12-23 NOTE — Assessment & Plan Note (Addendum)
Here for pre op - planning left THA 01/03/23.  EKG - SR/SB ventricular rate 58 - short PR - no acute ischemic changes.  No chest pain or sob.  Blood pressure as outlined.  Low today on exam.  Will stop amlodipine.  Get her back in soon to reassess.  Need to confirm stable prior to surgery.  Recheck cbc today.

## 2022-12-23 NOTE — Assessment & Plan Note (Signed)
Increased stress with current medical issues.  Discussed.  Does not feel needs further intervention at this time.  Doing better. Follow.

## 2022-12-26 ENCOUNTER — Ambulatory Visit (INDEPENDENT_AMBULATORY_CARE_PROVIDER_SITE_OTHER): Payer: No Typology Code available for payment source | Admitting: Internal Medicine

## 2022-12-26 ENCOUNTER — Encounter: Payer: Self-pay | Admitting: Internal Medicine

## 2022-12-26 VITALS — BP 100/50 | HR 64 | Temp 98.3°F | Resp 17 | Ht 63.0 in | Wt 113.1 lb

## 2022-12-26 DIAGNOSIS — E1165 Type 2 diabetes mellitus with hyperglycemia: Secondary | ICD-10-CM | POA: Diagnosis not present

## 2022-12-26 DIAGNOSIS — I10 Essential (primary) hypertension: Secondary | ICD-10-CM

## 2022-12-26 DIAGNOSIS — M316 Other giant cell arteritis: Secondary | ICD-10-CM

## 2022-12-26 DIAGNOSIS — E7800 Pure hypercholesterolemia, unspecified: Secondary | ICD-10-CM | POA: Diagnosis not present

## 2022-12-26 DIAGNOSIS — M87052 Idiopathic aseptic necrosis of left femur: Secondary | ICD-10-CM | POA: Diagnosis not present

## 2022-12-26 DIAGNOSIS — E039 Hypothyroidism, unspecified: Secondary | ICD-10-CM

## 2022-12-26 DIAGNOSIS — D649 Anemia, unspecified: Secondary | ICD-10-CM | POA: Diagnosis not present

## 2022-12-26 DIAGNOSIS — R9089 Other abnormal findings on diagnostic imaging of central nervous system: Secondary | ICD-10-CM

## 2022-12-26 DIAGNOSIS — R0989 Other specified symptoms and signs involving the circulatory and respiratory systems: Secondary | ICD-10-CM

## 2022-12-26 DIAGNOSIS — Z01818 Encounter for other preprocedural examination: Secondary | ICD-10-CM | POA: Diagnosis not present

## 2022-12-26 DIAGNOSIS — M8700 Idiopathic aseptic necrosis of unspecified bone: Secondary | ICD-10-CM | POA: Diagnosis not present

## 2022-12-26 NOTE — Progress Notes (Signed)
Subjective:    [name], female    DOB: 1945-07-07, 77 y.o.   MRN: [id number]  Patient here for  Chief Complaint  Patient presents with   Blood Pressure Check    HPI Here for follow up.  Just evaluated for pre op - hip surgery.  Blood pressure low.  Amlodipine stopped. Has been off amlodipine since last week. Other than her hip pain, she reports she feels she is doing ok.  Denies chest pain or sob.  No increased cough or congestion.  Eating.  Appetite improved.  No abdominal pain or bowel change.  Persistent increased hip pain - limiting her activity.  Denies any arm pain.  Reports left shoulder pain with limited rom.  Localized to the shoulder.     Past Medical History:  Diagnosis Date   Anemia    Angioedema due to angiotensin converting enzyme inhibitor (ACE-I)    Avascular necrosis (HCC)    Decreased GFR    DM (diabetes mellitus), type 2 (HCC)    no meds   Esophagitis    Goiter    s/p thyroidectomy   History of colonic polyps    Hypercholesterolemia    Hypertension    no current medications   Hypothyroidism    Marijuana use    Recurrent sinus infections    and allergy problems   Syncopal episodes    last one 12-16-22   Temporal arteritis (HCC)    Tobacco use    Uterine fibroid    Past Surgical History:  Procedure Laterality Date   ARTERY BIOPSY Right 03/13/2021   Procedure: BIOPSY TEMPORAL ARTERY;  Surgeon: Earline Mayotte, MD;  Location: [facility] ORS;  Service: General;  Laterality: Right;   BREAST BIOPSY     COLONOSCOPY WITH PROPOFOL N/A 11/03/2014   Procedure: COLONOSCOPY WITH PROPOFOL;  Surgeon: Scot Jun, MD;  Location: [facility] ENDOSCOPY;  Service: Endoscopy;  Laterality: N/A;   COLONOSCOPY WITH PROPOFOL N/A 02/12/2018   Procedure: COLONOSCOPY WITH PROPOFOL;  Surgeon: Scot Jun, MD;  Location: [facility] ENDOSCOPY;  Service: Endoscopy;  Laterality: N/A;   DILATION AND CURETTAGE OF UTERUS  1980 & 1993   ESOPHAGOGASTRODUODENOSCOPY N/A  11/03/2014   Procedure: ESOPHAGOGASTRODUODENOSCOPY (EGD);  Surgeon: Scot Jun, MD;  Location: [facility] ENDOSCOPY;  Service: Endoscopy;  Laterality: N/A;   LAPAROSCOPIC APPENDECTOMY N/A 11/26/2014   Procedure: APPENDECTOMY LAPAROSCOPIC;  Surgeon: Earline Mayotte, MD;  Location: [facility] ORS;  Service: General;  Laterality: N/A;   thyroidectomy  1979   goiter   TUBAL LIGATION  1975   Family History  Problem Relation Age of Onset   Tuberculosis Paternal Grandfather    Alzheimer's disease Mother    Goiter Mother    Cancer Brother    Cancer Brother    Cancer Brother    Cancer Brother    Social History   Socioeconomic History   Marital status: Married    Spouse name: Not on file   Number of children: Not on file   Years of education: Not on file   Highest education level: Not on file  Occupational History   Not on file  Tobacco Use   Smoking status: Former    Current packs/day: 0.00    Types: Cigarettes    Quit date: 04/16/2009    Years since quitting: 13.7   Smokeless tobacco: Never  Vaping Use   Vaping status: Never Used  Substance and Sexual Activity   Alcohol use: Yes    Alcohol/week:  3.0 - 4.0 standard drinks of alcohol    Types: 3 - 4 Standard drinks or equivalent per week    Comment: rare   Drug use: Yes    Types: Marijuana    Comment: marijuana every day   Sexual activity: Not on file  Other Topics Concern   Not on file  Social History Narrative   Not on file   Social Determinants of Health   Financial Resource Strain: Low Risk  (05/25/2022)   Overall Financial Resource Strain (CARDIA)    Difficulty of Paying Living Expenses: Not hard at all  Food Insecurity: No Food Insecurity (10/31/2022)   Hunger Vital Sign    Worried About Running Out of Food in the Last Year: Never true    Ran Out of Food in the Last Year: Never true  Transportation Needs: No Transportation Needs (10/31/2022)   PRAPARE - Administrator, Civil Service (Medical): No    Lack of  Transportation (Non-Medical): No  Physical Activity: Insufficiently Active (05/25/2022)   Exercise Vital Sign    Days of Exercise per Week: 3 days    Minutes of Exercise per Session: 30 min  Stress: No Stress Concern Present (05/25/2022)   [employer] of Occupational Health - Occupational Stress Questionnaire    Feeling of Stress : Not at all  Social Connections: Unknown (05/25/2022)   Social Connection and Isolation Panel [NHANES]    Frequency of Communication with Friends and Family: More than three times a week    Frequency of Social Gatherings with Friends and Family: More than three times a week    Attends Religious Services: Not on Marketing executive or Organizations: Not on file    Attends Banker Meetings: Not on file    Marital Status: Not on file     Review of Systems  Constitutional:  Negative for appetite change and unexpected weight change.  HENT:  Negative for congestion and sinus pressure.   Respiratory:  Negative for cough, chest tightness and shortness of breath.   Cardiovascular:  Negative for chest pain and palpitations.  Gastrointestinal:  Negative for abdominal pain, diarrhea, nausea and vomiting.  Genitourinary:  Negative for difficulty urinating and dysuria.  Musculoskeletal:  Negative for myalgias.       Hip pain as outlined.   Skin:  Negative for color change and rash.  Neurological:  Negative for dizziness and headaches.  Psychiatric/Behavioral:  Negative for agitation and dysphoric mood.        Objective:     BP (!) 100/50   Pulse 64   Temp 98.3 F (36.8 C) (Oral)   Resp 17   Ht 5\' 3"  (1.6 m)   Wt 113 lb 2 oz (51.3 kg)   SpO2 99%   BMI 20.04 kg/m  Wt Readings from Last 3 Encounters:  12/27/22 115 lb 8.3 oz (52.4 kg)  12/26/22 113 lb 2 oz (51.3 kg)  12/19/22 112 lb (50.8 kg)    Physical Exam Vitals reviewed.  Constitutional:      General: She is not in acute distress.    Appearance: Normal appearance.  HENT:      Head: Normocephalic and atraumatic.     Right Ear: External ear normal.     Left Ear: External ear normal.  Eyes:     General: No scleral icterus.       Right eye: No discharge.        Left eye: No discharge.  Conjunctiva/sclera: Conjunctivae normal.  Neck:     Thyroid: No thyromegaly.  Cardiovascular:     Rate and Rhythm: Normal rate and regular rhythm.     Comments: Unable to palpate pulse - left arm.   Pulmonary:     Effort: No respiratory distress.     Breath sounds: Normal breath sounds. No wheezing.  Abdominal:     General: Bowel sounds are normal.     Palpations: Abdomen is soft.     Tenderness: There is no abdominal tenderness.  Musculoskeletal:        General: No swelling or tenderness.     Cervical back: Neck supple. No tenderness.  Lymphadenopathy:     Cervical: No cervical adenopathy.  Skin:    Findings: No erythema or rash.  Neurological:     Mental Status: She is alert.  Psychiatric:        Mood and Affect: Mood normal.        Behavior: Behavior normal.      Outpatient Encounter Medications as of 12/26/2022  Medication Sig   cyanocobalamin 1000 MCG tablet Take 1,000 mcg by mouth daily.   ferrous sulfate 325 (65 FE) MG tablet Take 325 mg by mouth daily with breakfast.   levothyroxine (SYNTHROID) 50 MCG tablet Take 1 tablet (50 mcg total) by mouth daily. (Patient taking differently: Take 50 mcg by mouth daily before breakfast.)   rosuvastatin (CRESTOR) 10 MG tablet TAKE 1 TABLET BY MOUTH EVERY DAY (Patient taking differently: Take 10 mg by mouth every evening.)   No facility-administered encounter medications on file as of 12/26/2022.     Lab Results  Component Value Date   WBC 6.8 12/19/2022   HGB 10.6 (L) 12/19/2022   HCT 33.5 (L) 12/19/2022   PLT 402.0 (H) 12/19/2022   GLUCOSE 82 12/19/2022   CHOL 144 12/19/2022   TRIG 73.0 12/19/2022   HDL 69.20 12/19/2022   LDLCALC 60 12/19/2022   ALT 7 12/19/2022   AST 12 12/19/2022   NA 136  12/19/2022   K 4.2 12/19/2022   CL 99 12/19/2022   CREATININE 0.87 12/19/2022   BUN 14 12/19/2022   CO2 30 12/19/2022   TSH 3.10 09/18/2022   INR 1.2 06/07/2021   HGBA1C 6.0 12/19/2022    CT ABDOMEN PELVIS W CONTRAST  Result Date: 12/09/2022 CLINICAL DATA:  Iron deficiency anemia and unintentional weight loss. EXAM: CT ABDOMEN AND PELVIS WITH CONTRAST TECHNIQUE: Multidetector CT imaging of the abdomen and pelvis was performed using the standard protocol following bolus administration of intravenous contrast. RADIATION DOSE REDUCTION: This exam was performed according to the departmental dose-optimization program which includes automated exposure control, adjustment of the mA and/or kV according to patient size and/or use of iterative reconstruction technique. CONTRAST:  OMNIPAQUE IOHEXOL 300 MG/ML  SOLN COMPARISON:  None Available. FINDINGS: Lower chest: The visualized lung bases are clear. No intra-abdominal free air or free fluid. Hepatobiliary: Multiple liver cysts and additional subcentimeter hypodense lesions which are too small to characterize. No biliary ductal dilatation. Small gallstones. No pericholecystic fluid or evidence of acute cholecystitis by CT. Pancreas: Unremarkable. No pancreatic ductal dilatation or surrounding inflammatory changes. Spleen: Normal in size without focal abnormality. Adrenals/Urinary Tract: The adrenal glands are unremarkable. There is a 3.5 cm left renal inferior pole cyst and additional subcentimeter hypodense lesions which are too small to characterize. There is no hydronephrosis on either side. There is symmetric enhancement and excretion of contrast by both kidneys. The urinary bladder is grossly unremarkable.  Stomach/Bowel: Oral contrast opacifies several loops of small bowel and colon. There is no bowel obstruction or active inflammation. The appendix is not visualized with certainty. No inflammatory changes identified in the right lower quadrant.  Vascular/Lymphatic: Moderate aortoiliac atherosclerotic disease. The IVC is unremarkable. No portal venous gas. There is no adenopathy. Reproductive: The uterus is anteverted. Calcified uterine fibroid. No adnexal masses. Other: None Musculoskeletal: Degenerative changes of the spine. 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 IMPRESSION: 1. No acute intra-abdominal or pelvic pathology. 2. Cholelithiasis. 3. 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 4.  Aortic Atherosclerosis (ICD10-I70.0). Electronically Signed   By: Elgie Collard M.D.   On: 12/09/2022 00:56       Assessment & Plan:  Absent pulse in upper extremity Assessment & Plan: Unable to palpate pulse in left arm.  Blood pressure variation as outlined.  No pain in the arm.  Remain off blood pressure medication.  Blood pressure in right arm - ok.  Discussed with Ms Duitsman,  and with Dr Audelia Acton.  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 With upcoming surgery, will need to use her right arm for blood pressure monitoring.  Discussed the need (after recovered from her surgery) for further testing to determine etiology.  Will plan f/u with [facility]. Pt aware and in agreement.    Abnormal MRA, brain Assessment & Plan: MRA brain performed during previous w/up for headaches. MRA - Mildly motion degraded exam. No intracranial large vessel occlusion or proximal high-grade arterial stenosis.  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 Both posterior cerebral arteries demonstrate distal branch atherosclerotic irregularity. 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 Apparent 1-2 mm broad-based vascular protrusion arising from the supraclinoid right ICA, which may reflect a small aneurysm or artifact related to motion.  Had referred her to neurology.  It appears they tried to schedule an appt.  No headache currently. She  previously declined further w/up.     Anemia, unspecified type Assessment & Plan: Colonoscopy 09/27/20 - pathology - rectal polyp - hyperplastic polyp.  Recommended no f/u colonoscopy.  Weight stable.  Hgb 12/19/22 - improved - 10.6. Taking iron.  Has f/u with hematology next week.    Avascular necrosis (HCC) Assessment & Plan: Recent xray revealed avascular necrosis of bilateral femoral heads with partial collapse of the left femoral head. Increased pain in her hip. Limiting activity. Saw ortho.  Planning for left THA as outlined.    Essential hypertension, benign Assessment & Plan: Off amlodipine now for one week.  Blood pressure right arm 110/68 and left arm systolic in 80s.  Remain off blood pressure medication.  Discussed with surgery, will need to monitor blood pressure using right arm.     Hypercholesterolemia Assessment & Plan: Continue crestor.  Follow lipid panel and liver function tests.   Lab Results  Component Value Date   CHOL 144 12/19/2022   HDL 69.20 12/19/2022   LDLCALC 60 12/19/2022   TRIG 73.0 12/19/2022   CHOLHDL 2 12/19/2022     Hypothyroidism, unspecified type Assessment & Plan: On synthroid.  Follow tsh.    Pre-op evaluation Assessment & Plan: Here for pre op - planning left THA 01/03/23.  EKG - SR/SB ventricular rate 58 - short PR - no acute ischemic changes.  No chest pain or sob.  Blood pressure as outlined.  Off amlodipine.  Will remain off. Blood pressure varying in upper extremities as outlined.  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  Will need to monitor blood pressure using right arm.  Discussed with vascular and surgery.  Will need close intra op and post op monitoring of blood pressure to avoid extremes.  Denies chest pain or sob.  Plan to proceed with planned surgery as outlined.  Pt aware of above.    Type 2 diabetes mellitus with hyperglycemia, without long-term current use of insulin (HCC) Assessment & Plan: Follow met b and A1c.  Off  prednisone.    Temporal arteritis (HCC) Assessment & Plan: Followed by rheumatology.   Off prednisone.  Doing well off.  Follow.       Dale [place], MD

## 2022-12-27 ENCOUNTER — Encounter
Admission: RE | Admit: 2022-12-27 | Discharge: 2022-12-27 | Disposition: A | Discharge status: Home / Self Care | Payer: No Typology Code available for payment source | Source: Ambulatory Visit | Attending: Orthopedic Surgery | Admitting: Orthopedic Surgery

## 2022-12-27 VITALS — BP 129/65 | HR 60 | Ht 63.0 in | Wt 115.5 lb

## 2022-12-27 DIAGNOSIS — R8290 Unspecified abnormal findings in urine: Secondary | ICD-10-CM | POA: Diagnosis not present

## 2022-12-27 DIAGNOSIS — Z01812 Encounter for preprocedural laboratory examination: Secondary | ICD-10-CM

## 2022-12-27 DIAGNOSIS — R8271 Bacteriuria: Secondary | ICD-10-CM

## 2022-12-27 DIAGNOSIS — R8281 Pyuria: Secondary | ICD-10-CM | POA: Diagnosis not present

## 2022-12-27 DIAGNOSIS — Z01818 Encounter for other preprocedural examination: Secondary | ICD-10-CM

## 2022-12-27 HISTORY — DX: Type 2 diabetes mellitus without complications: E11.9

## 2022-12-27 HISTORY — DX: Abnormal results of kidney function studies: R94.4

## 2022-12-27 HISTORY — DX: Cannabis use, unspecified, uncomplicated: F12.90

## 2022-12-27 HISTORY — DX: Idiopathic aseptic necrosis of unspecified bone: M87.00

## 2022-12-27 HISTORY — DX: Angioneurotic edema, initial encounter: T78.3XXA

## 2022-12-27 HISTORY — DX: Other giant cell arteritis: M31.6

## 2022-12-27 HISTORY — DX: Tobacco use: Z72.0

## 2022-12-27 HISTORY — DX: Leiomyoma of uterus, unspecified: D25.9

## 2022-12-27 HISTORY — DX: Syncope and collapse: R55

## 2022-12-27 HISTORY — DX: Personal history of colonic polyps: Z86.010

## 2022-12-27 HISTORY — DX: Esophagitis, unspecified without bleeding: K20.90

## 2022-12-27 HISTORY — DX: Personal history of colon polyps, unspecified: Z86.0100

## 2022-12-27 LAB — URINALYSIS, ROUTINE W REFLEX MICROSCOPIC
Bilirubin Urine: NEGATIVE
Glucose, UA: NEGATIVE mg/dL
Hgb urine dipstick: NEGATIVE
Ketones, ur: 5 mg/dL — AB
Nitrite: POSITIVE — AB
Protein, ur: NEGATIVE mg/dL
Specific Gravity, Urine: 1.029 (ref 1.005–1.030)
pH: 5 (ref 5.0–8.0)

## 2022-12-27 LAB — SURGICAL PCR SCREEN
MRSA, PCR: NEGATIVE
Staphylococcus aureus: NEGATIVE

## 2022-12-27 LAB — TYPE AND SCREEN
ABO/RH(D): A POS
Antibody Screen: NEGATIVE

## 2022-12-27 NOTE — Patient Instructions (Addendum)
Your procedure is scheduled on:01-03-23 Thursday Report to the Registration Desk on the 1st floor of the [facility].Then proceed to the 2nd floor Surgery Desk To find out your arrival time, please call [phone] between 1PM - 3PM on:01-02-23 Wednesday If your arrival time is 6:00 am, do not arrive before that time as the [facility] entrance doors do not open until 6:00 am.  REMEMBER: Instructions that are not followed completely may result in serious medical risk, up to and including death; or upon the discretion of your surgeon and anesthesiologist your surgery may need to be rescheduled.  Do not eat food after midnight the night before surgery.  No gum chewing or hard candies.  You may however, drink Water up to 2 hours before you are scheduled to arrive for your surgery. Do not drink anything within 2 hours of your scheduled arrival time.  In addition, your doctor has ordered for you to drink the provided:  Gatorade G2 Drinking this carbohydrate drink up to two hours before surgery helps to reduce insulin resistance and improve patient outcomes. Please complete drinking 2 hours before scheduled arrival time.  One week prior to surgery:Last dose 12-27-22 Stop Anti-inflammatories (NSAIDS) such as Advil, Aleve, Ibuprofen, Motrin, Naproxen, Naprosyn and Aspirin based products such as Excedrin, Goody's Powder, BC Powder.You may however, take Tylenol if needed for pain up until the day of surgery. Stop ANY OVER THE COUNTER supplements/vitamins NOW (12-27-22) until after surgery (Vitamin B12, Ferrous Sulfate)   Continue taking all prescribed medications   TAKE ONLY THESE MEDICATIONS THE MORNING OF SURGERY WITH A SIP OF WATER: -levothyroxine (SYNTHROID)   No Alcohol for 24 hours before or after surgery.  No Smoking including e-cigarettes for 24 hours before surgery.  No chewable tobacco products for at least 6 hours before surgery.  No nicotine patches on the day of surgery.  Do not  use any "recreational" drugs for at least a week (preferably 2 weeks) before your surgery.  Please be advised that the combination of cocaine and anesthesia may have negative outcomes, up to and including death. If you test positive for cocaine, your surgery will be cancelled.  On the morning of surgery brush your teeth with toothpaste and water, you may rinse your mouth with mouthwash if you wish. Do not swallow any toothpaste or mouthwash.  Use CHG Soap as directed on instruction sheet.  Do not wear jewelry, make-up, hairpins, clips or nail polish.  For welded (permanent) jewelry: bracelets, anklets, waist bands, etc.  Please have this removed prior to surgery.  If it is not removed, there is a chance that hospital personnel will need to cut it off on the day of surgery.  Do not wear lotions, powders, or perfumes.   Do not shave body hair from the neck down 48 hours before surgery.  Contact lenses, hearing aids and dentures may not be worn into surgery.  Do not bring valuables to the hospital. [facility] is not responsible for any missing/lost belongings or valuables.    Notify your doctor if there is any change in your medical condition (cold, fever, infection).  Wear comfortable clothing (specific to your surgery type) to the hospital.  After surgery, you can help prevent lung complications by doing breathing exercises.  Take deep breaths and cough every 1-2 hours. Your doctor may order a device called an Incentive Spirometer to help you take deep breaths. When coughing or sneezing, hold a pillow firmly against your incision with both hands. This  is called "splinting." Doing this helps protect your incision. It also decreases belly discomfort.  If you are being admitted to the hospital overnight, leave your suitcase in the car. After surgery it may be brought to your room.  In case of increased patient census, it may be necessary for you, the patient, to continue your  postoperative care in the Same Day Surgery department.  If you are being discharged the day of surgery, you will not be allowed to drive home. You will need a responsible individual to drive you home and stay with you for 24 hours after surgery.   If you are taking public transportation, you will need to have a responsible individual with you.  Please call the Pre-admissions Testing Dept. at [phone] if you have any questions about these instructions.  Surgery Visitation Policy:  Patients having surgery or a procedure may have two visitors.  Children under the age of 60 must have an adult with them who is not the patient.  Inpatient Visitation:    Visiting hours are 7 a.m. to 8 p.m. Up to four visitors are allowed at one time in a patient room. The visitors may rotate out with other people during the day.  One visitor age 52 or older may stay with the patient overnight and must be in the room by 8 p.m.    Pre-operative 5 CHG Bath Instructions   You can play a key role in reducing the risk of infection after surgery. Your skin needs to be as free of germs as possible. You can reduce the number of germs on your skin by washing with CHG (chlorhexidine gluconate) soap before surgery. CHG is an antiseptic soap that kills germs and continues to kill germs even after washing.   DO NOT use if you have an allergy to chlorhexidine/CHG or antibacterial soaps. If your skin becomes reddened or irritated, stop using the CHG and notify one of our RNs at [phone].   Please shower with the CHG soap starting 4 days before surgery using the following schedule:     Please keep in mind the following:  DO NOT shave, including legs and underarms, starting the day of your first shower.   You may shave your face at any point before/day of surgery.  Place clean sheets on your bed the day you start using CHG soap. Use a clean washcloth (not used since being washed) for each shower. DO NOT sleep  with pets once you start using the CHG.   CHG Shower Instructions:  If you choose to wash your hair and private area, wash first with your normal shampoo/soap.  After you use shampoo/soap, rinse your hair and body thoroughly to remove shampoo/soap residue.  Turn the water OFF and apply about 3 tablespoons (45 ml) of CHG soap to a CLEAN washcloth.  Apply CHG soap ONLY FROM YOUR NECK DOWN TO YOUR TOES (washing for 3-5 minutes)  DO NOT use CHG soap on face, private areas, open wounds, or sores.  Pay special attention to the area where your surgery is being performed.  If you are having back surgery, having someone wash your back for you may be helpful. Wait 2 minutes after CHG soap is applied, then you may rinse off the CHG soap.  Pat dry with a clean towel  Put on clean clothes/pajamas   If you choose to wear lotion, please use ONLY the CHG-compatible lotions on the back of this paper.     Additional instructions for  the day of surgery: DO NOT APPLY any lotions, deodorants, cologne, or perfumes.   Put on clean/comfortable clothes.  Brush your teeth.  Ask your nurse before applying any prescription medications to the skin.      CHG Compatible Lotions   Aveeno Moisturizing lotion  Cetaphil Moisturizing Cream  Cetaphil Moisturizing Lotion  Clairol Herbal Essence Moisturizing Lotion, Dry Skin  Clairol Herbal Essence Moisturizing Lotion, Extra Dry Skin  Clairol Herbal Essence Moisturizing Lotion, Normal Skin  Curel Age Defying Therapeutic Moisturizing Lotion with Alpha Hydroxy  Curel Extreme Care Body Lotion  Curel Soothing Hands Moisturizing Hand Lotion  Curel Therapeutic Moisturizing Cream, Fragrance-Free  Curel Therapeutic Moisturizing Lotion, Fragrance-Free  Curel Therapeutic Moisturizing Lotion, Original Formula  Eucerin Daily Replenishing Lotion  Eucerin Dry Skin Therapy Plus Alpha Hydroxy Crme  Eucerin Dry Skin Therapy Plus Alpha Hydroxy Lotion  Eucerin Original Crme   Eucerin Original Lotion  Eucerin Plus Crme Eucerin Plus Lotion  Eucerin TriLipid Replenishing Lotion  Keri Anti-Bacterial Hand Lotion  Keri Deep Conditioning Original Lotion Dry Skin Formula Softly Scented  Keri Deep Conditioning Original Lotion, Fragrance Free Sensitive Skin Formula  Keri Lotion Fast Absorbing Fragrance Free Sensitive Skin Formula  Keri Lotion Fast Absorbing Softly Scented Dry Skin Formula  Keri Original Lotion  Keri Skin Renewal Lotion Keri Silky Smooth Lotion  Keri Silky Smooth Sensitive Skin Lotion  Nivea Body Creamy Conditioning Oil  Nivea Body Extra Enriched Lotion  Nivea Body Original Lotion  Nivea Body Sheer Moisturizing Lotion Nivea Crme  Nivea Skin Firming Lotion  NutraDerm 30 Skin Lotion  NutraDerm Skin Lotion  NutraDerm Therapeutic Skin Cream  NutraDerm Therapeutic Skin Lotion  ProShield Protective Hand Cream  Provon moisturizing lotion  How to Use an Incentive Spirometer An incentive spirometer is a tool that measures how well you are filling your lungs with each breath. Learning to take long, deep breaths using this tool can help you keep your lungs clear and active. This may help to reverse or lessen your chance of developing breathing (pulmonary) problems, especially infection. You may be asked to use a spirometer: After a surgery. If you have a lung problem or a history of smoking. After a long period of time when you have been unable to move or be active. If the spirometer includes an indicator to show the highest number that you have reached, your health care provider or respiratory therapist will help you set a goal. Keep a log of your progress as told by your health care provider. What are the risks? Breathing too quickly may cause dizziness or cause you to pass out. Take your time so you do not get dizzy or light-headed. If you are in pain, you may need to take pain medicine before doing incentive spirometry. It is harder to take a deep breath  if you are having pain. How to use your incentive spirometer  Sit up on the edge of your bed or on a chair. Hold the incentive spirometer so that it is in an upright position. Before you use the spirometer, breathe out normally. Place the mouthpiece in your mouth. Make sure your lips are closed tightly around it. Breathe in slowly and as deeply as you can through your mouth, causing the piston or the ball to rise toward the top of the chamber. Hold your breath for 3-5 seconds, or for as long as possible. If the spirometer includes a coach indicator, use this to guide you in breathing. Slow down your breathing if the indicator  goes above the marked areas. Remove the mouthpiece from your mouth and breathe out normally. The piston or ball will return to the bottom of the chamber. Rest for a few seconds, then repeat the steps 10 or more times. Take your time and take a few normal breaths between deep breaths so that you do not get dizzy or light-headed. Do this every 1-2 hours when you are awake. If the spirometer includes a goal marker to show the highest number you have reached (best effort), use this as a goal to work toward during each repetition. After each set of 10 deep breaths, cough a few times. This will help to make sure that your lungs are clear. If you have an incision on your chest or abdomen from surgery, place a pillow or a rolled-up towel firmly against the incision when you cough. This can help to reduce pain while taking deep breaths and coughing. General tips When you are able to get out of bed: Walk around often. Continue to take deep breaths and cough in order to clear your lungs. Keep using the incentive spirometer until your health care provider says it is okay to stop using it. If you have been in the hospital, you may be told to keep using the spirometer at home. Contact a health care provider if: You are having difficulty using the spirometer. You have trouble using the  spirometer as often as instructed. Your pain medicine is not giving enough relief for you to use the spirometer as told. You have a fever. Get help right away if: You develop shortness of breath. You develop a cough with bloody mucus from the lungs. You have fluid or blood coming from an incision site after you cough. Summary An incentive spirometer is a tool that can help you learn to take long, deep breaths to keep your lungs clear and active. You may be asked to use a spirometer after a surgery, if you have a lung problem or a history of smoking, or if you have been inactive for a long period of time. Use your incentive spirometer as instructed every 1-2 hours while you are awake. If you have an incision on your chest or abdomen, place a pillow or a rolled-up towel firmly against your incision when you cough. This will help to reduce pain. Get help right away if you have shortness of breath, you cough up bloody mucus, or blood comes from your incision when you cough. This information is not intended to replace advice given to you by your health care provider. Make sure you discuss any questions you have with your health care provider. Document Revised: 06/22/2019 Document Reviewed: 06/22/2019 Elsevier Patient Education  2024 [employer].

## 2022-12-29 LAB — URINE CULTURE: Culture: >=100000 — AB

## 2022-12-31 ENCOUNTER — Encounter: Payer: Self-pay | Admitting: Internal Medicine

## 2022-12-31 ENCOUNTER — Other Ambulatory Visit: Payer: Self-pay | Admitting: *Deleted

## 2022-12-31 ENCOUNTER — Telehealth: Payer: Self-pay | Admitting: Urgent Care

## 2022-12-31 DIAGNOSIS — R0989 Other specified symptoms and signs involving the circulatory and respiratory systems: Secondary | ICD-10-CM | POA: Insufficient documentation

## 2022-12-31 DIAGNOSIS — Z01812 Encounter for preprocedural laboratory examination: Secondary | ICD-10-CM

## 2022-12-31 DIAGNOSIS — B9620 Unspecified Escherichia coli [E. coli] as the cause of diseases classified elsewhere: Secondary | ICD-10-CM

## 2022-12-31 DIAGNOSIS — D508 Other iron deficiency anemias: Secondary | ICD-10-CM

## 2022-12-31 MED ORDER — SULFAMETHOXAZOLE-TRIMETHOPRIM 800-160 MG PO TABS
1.0000 | ORAL_TABLET | Freq: Two times a day (BID) | ORAL | 0 refills | Status: DC
Start: 2022-12-31 — End: 2023-01-04

## 2022-12-31 NOTE — Assessment & Plan Note (Signed)
Follow met b and A1c.  Off prednisone.

## 2022-12-31 NOTE — Assessment & Plan Note (Signed)
Followed by rheumatology.   Off prednisone.  Doing well off.  Follow.

## 2022-12-31 NOTE — Assessment & Plan Note (Signed)
On synthroid.  Follow tsh.   

## 2022-12-31 NOTE — Assessment & Plan Note (Signed)
Colonoscopy 09/27/20 - pathology - rectal polyp - hyperplastic polyp.  Recommended no f/u colonoscopy.  Weight stable.  Hgb 12/19/22 - improved - 10.6. Taking iron.  Has f/u with hematology next week.

## 2022-12-31 NOTE — Assessment & Plan Note (Signed)
MRA brain performed during previous w/up for headaches. MRA - Mildly motion degraded exam. No intracranial large vessel occlusion or proximal high-grade arterial stenosis.  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 Both posterior cerebral arteries demonstrate distal branch atherosclerotic irregularity. 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 Apparent 1-2 mm broad-based vascular protrusion arising from the supraclinoid right ICA, which may reflect a small aneurysm or artifact related to motion.  Had referred her to neurology.  It appears they tried to schedule an appt.  No headache currently. She previously declined further w/up.

## 2022-12-31 NOTE — Assessment & Plan Note (Signed)
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 Increased pain in her hip. Limiting activity. Saw ortho.  Planning for left THA as outlined.

## 2022-12-31 NOTE — Assessment & Plan Note (Signed)
Unable to palpate pulse in left arm.  Blood pressure variation as outlined.  No pain in the arm.  Remain off blood pressure medication.  Blood pressure in right arm - ok.  Discussed with Ms Folk,  and with Dr Audelia Acton.  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 With upcoming surgery, will need to use her right arm for blood pressure monitoring.  Discussed the need (after recovered from her surgery) for further testing to determine etiology.  Will plan f/u with [facility]. Pt aware and in agreement.

## 2022-12-31 NOTE — Progress Notes (Signed)
[facility] Perioperative Services: Pre-Admission/Anesthesia Testing  Abnormal Lab Notification and Treatment Plan of Care   Date: 12/31/22  Name: Jessica Wall MRN:   [id number]  Re: Abnormal labs noted during PAT appointment   Notified:  Provider Name Provider Role Notification Mode  Reinaldo Berber, MD Orthopedics Routed and/or faxed via [facility]   Abnormal Lab Value(s):   Lab Results  Component Value Date   COLORURINE AMBER (A) 12/27/2022   APPEARANCEUR CLOUDY (A) 12/27/2022   LABSPEC 1.029 12/27/2022   PHURINE 5.0 12/27/2022   GLUCOSEU NEGATIVE 12/27/2022   HGBUR NEGATIVE 12/27/2022   BILIRUBINUR NEGATIVE 12/27/2022   KETONESUR 5 (A) 12/27/2022   PROTEINUR NEGATIVE 12/27/2022   NITRITE POSITIVE (A) 12/27/2022   LEUKOCYTESUR SMALL (A) 12/27/2022   EPIU 6-10 12/27/2022   WBCU 11-20 12/27/2022   RBCU 0-5 12/27/2022   BACTERIA MANY (A) 12/27/2022   CULT >=100,000 COLONIES/mL ESCHERICHIA COLI (A) 12/27/2022    Clinical Information and Notes:  Patient is scheduled for an elective LEFT TOTAL HIP ARTHROPLASTY ANTERIOR APPROACH on 01/03/2023.    UA performed in PAT consistent with/concerning for infection.  No leukocytosis noted on CBC; WBC 6.8 Renal function: Estimated Creatinine Clearance: 46.2 mL/min (by C-G formula based on SCr of 0.87 mg/dL). Urine C&S added to assess for pathogenically significant growth.  Impression and Plan:  Dewaine Oats with a UA that was (+) for infection; reflex culture sent. (+) significant Escherichia coli colony counts identified. Contacted patient to discuss; spoke with her husband. Patient reporting that she is experiencing minor LUTS; mainly frequency and a bit of dysuria. No nausea, vomiting, fever, chills, or significant abdominal/back pain. Patient with surgery scheduled soon. In efforts to avoid delaying patient's procedure, or have her experience any potentially significant perioperative complications related to  the aforementioned, I would like to proceed with treatment for urinary tract infection.  Allergies reviewed. Culture report also reviewed to ensure culture appropriate coverage is being provided. Will treat with a 3 day course of SMZ-TMP DS. Patient encouraged to complete the entire course of antibiotics even if she begins to feel better.    Meds ordered this encounter  Medications   sulfamethoxazole-trimethoprim (BACTRIM DS) 800-160 MG tablet    Sig: Take 1 tablet by mouth 2 (two) times daily for 3 days.    Dispense:  6 tablet    Refill:  0   Patient encouraged to increase her fluid intake as much as possible. Discussed that water is always best to flush the urinary tract. She was advised to avoid caffeine containing fluids until her infections clears, as caffeine can cause her to experience painful bladder spasms.   May use Tylenol as needed for pain/fever should she experience these symptoms. May also use over the counter phenazopyridine to help relieve her current urinary pain.   Patient instructed to call surgeon's office or PAT with any questions or concerns related to the above outlined course of treatment. Additionally, she was instructed to call if she feels like she is getting worse overall while on treatment. Results and treatment plan of care forwarded to primary attending surgeon to make them aware.   Encounter Diagnoses  Name Primary?   Pre-operative laboratory examination Yes   E. coli UTI (urinary tract infection)    Quentin Mulling, MSN, APRN, FNP-C, CEN [facility]  Perioperative Services Nurse Practitioner Phone: [phone] Fax: [phone] 12/31/22 9:08 AM  NOTE: This note has been prepared using Dragon dictation software. Despite my  best ability to proofread, there is always the potential that unintentional transcriptional errors may still occur from this process.

## 2022-12-31 NOTE — Assessment & Plan Note (Signed)
Here for pre op - planning left THA 01/03/23.  EKG - SR/SB ventricular rate 58 - short PR - no acute ischemic changes.  No chest pain or sob.  Blood pressure as outlined.  Off amlodipine.  Will remain off. Blood pressure varying in upper extremities as outlined.  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  Will need to monitor blood pressure using right arm.  Discussed with vascular and surgery.  Will need close intra op and post op monitoring of blood pressure to avoid extremes.  Denies chest pain or sob.  Plan to proceed with planned surgery as outlined.  Pt aware of above.

## 2022-12-31 NOTE — Assessment & Plan Note (Signed)
Off amlodipine now for one week.  Blood pressure right arm 110/68 and left arm systolic in 80s.  Remain off blood pressure medication.  Discussed with surgery, will need to monitor blood pressure using right arm.

## 2022-12-31 NOTE — Assessment & Plan Note (Signed)
Continue crestor.  Follow lipid panel and liver function tests.   Lab Results  Component Value Date   CHOL 144 12/19/2022   HDL 69.20 12/19/2022   LDLCALC 60 12/19/2022   TRIG 73.0 12/19/2022   CHOLHDL 2 12/19/2022

## 2023-01-01 ENCOUNTER — Inpatient Hospital Stay: Payer: No Typology Code available for payment source | Attending: Oncology

## 2023-01-01 ENCOUNTER — Inpatient Hospital Stay (HOSPITAL_BASED_OUTPATIENT_CLINIC_OR_DEPARTMENT_OTHER): Payer: No Typology Code available for payment source | Admitting: Oncology

## 2023-01-01 ENCOUNTER — Encounter: Payer: Self-pay | Admitting: Oncology

## 2023-01-01 VITALS — BP 112/61 | HR 71 | Temp 96.1°F | Resp 18 | Wt 110.0 lb

## 2023-01-01 DIAGNOSIS — D638 Anemia in other chronic diseases classified elsewhere: Secondary | ICD-10-CM | POA: Diagnosis not present

## 2023-01-01 DIAGNOSIS — D649 Anemia, unspecified: Secondary | ICD-10-CM

## 2023-01-01 DIAGNOSIS — Z79899 Other long term (current) drug therapy: Secondary | ICD-10-CM | POA: Diagnosis not present

## 2023-01-01 DIAGNOSIS — Z87891 Personal history of nicotine dependence: Secondary | ICD-10-CM | POA: Insufficient documentation

## 2023-01-01 DIAGNOSIS — D508 Other iron deficiency anemias: Secondary | ICD-10-CM | POA: Diagnosis not present

## 2023-01-01 DIAGNOSIS — D509 Iron deficiency anemia, unspecified: Secondary | ICD-10-CM | POA: Insufficient documentation

## 2023-01-01 LAB — CBC WITH DIFFERENTIAL/PLATELET
Abs Immature Granulocytes: 0.01 10*3/uL (ref 0.00–0.07)
Basophils Absolute: 0 10*3/uL (ref 0.0–0.1)
Basophils Relative: 1 %
Eosinophils Absolute: 0.1 10*3/uL (ref 0.0–0.5)
Eosinophils Relative: 2 %
HCT: 33.7 % — ABNORMAL LOW (ref 36.0–46.0)
Hemoglobin: 10.8 g/dL — ABNORMAL LOW (ref 12.0–15.0)
Immature Granulocytes: 0 %
Lymphocytes Relative: 44 %
Lymphs Abs: 2.4 10*3/uL (ref 0.7–4.0)
MCH: 28.9 pg (ref 26.0–34.0)
MCHC: 32 g/dL (ref 30.0–36.0)
MCV: 90.1 fL (ref 80.0–100.0)
Monocytes Absolute: 0.4 10*3/uL (ref 0.1–1.0)
Monocytes Relative: 7 %
Neutro Abs: 2.5 10*3/uL (ref 1.7–7.7)
Neutrophils Relative %: 46 %
Platelets: 344 10*3/uL (ref 150–400)
RBC: 3.74 MIL/uL — ABNORMAL LOW (ref 3.87–5.11)
RDW: 17.8 % — ABNORMAL HIGH (ref 11.5–15.5)
WBC: 5.4 10*3/uL (ref 4.0–10.5)
nRBC: 0 % (ref 0.0–0.2)

## 2023-01-01 LAB — IRON AND TIBC
Iron: 28 ug/dL (ref 28–170)
Saturation Ratios: 14 % (ref 10.4–31.8)
TIBC: 199 ug/dL — ABNORMAL LOW (ref 250–450)
UIBC: 171 ug/dL

## 2023-01-01 LAB — FERRITIN: Ferritin: 662 ng/mL — ABNORMAL HIGH (ref 11–307)

## 2023-01-01 NOTE — Progress Notes (Signed)
Hematology/Oncology Consult note [facility]  Telephone:([phone] [phone]  Patient Care Team: Dale Herndon, MD as PCP - General (Internal Medicine) Dale Cl[place], MD (Internal Medicine) Lemar Livings Merrily Pew, MD (General Surgery)   Name of the patient: Jessica Wall  [id number]  [birth year]   Date of visit: 01/01/23  Diagnosis-normocytic anemia likely combination of iron deficiency and anemia of chronic disease  Chief complaint/ Reason for visit-routine follow-up of anemia  Heme/Onc history: Patient is a 77 year old female past medical history is significant for hypothyroidism hypertension and hyperlipidemia. 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 She is scheduled to undergo left hip replacement and is here for further evaluation of anemia. Most recent CBC from 10/17/2022 showed an H&H of 10.1/30.7 which is gradually drifted down over the last 2 years. Prior to that her hemoglobin was around 12. Most recent iron studies showed a low iron saturation of 6.4% and low TIBC and normal ferritin of 237.   Results of anemia workup from 10/31/2022 were as follows: H&H 8.8/27.9 ferritin level 201 iron saturation 12% folate and B12 are normal.  Myeloma panel showed no M protein.  Haptoglobin elevated at 554.  Reticulocyte count low at 1.6.  Serum free light chain ratio normal.  Interval history-patient has upcoming hip surgery in a few days time.  Overall she feels better after receiving IV iron  ECOG PS- 1 Pain scale- 3   Review of systems- Review of Systems  Constitutional:  Positive for malaise/fatigue. Negative for chills, fever and weight loss.  HENT:  Negative for congestion, ear discharge and nosebleeds.   Eyes:  Negative for blurred vision.  Respiratory:  Negative for cough, hemoptysis, sputum production, shortness of breath and wheezing.   Cardiovascular:   Negative for chest pain, palpitations, orthopnea and claudication.  Gastrointestinal:  Negative for abdominal pain, blood in stool, constipation, diarrhea, heartburn, melena, nausea and vomiting.  Genitourinary:  Negative for dysuria, flank pain, frequency, hematuria and urgency.  Musculoskeletal:  Negative for back pain, joint pain and myalgias.       Left hip pain  Skin:  Negative for rash.  Neurological:  Negative for dizziness, tingling, focal weakness, seizures, weakness and headaches.  Endo/Heme/Allergies:  Does not bruise/bleed easily.  Psychiatric/Behavioral:  Negative for depression and suicidal ideas. The patient does not have insomnia.       Allergies  Allergen Reactions   Ace Inhibitors Swelling   Aspirin Swelling     Past Medical History:  Diagnosis Date   Anemia    Angioedema due to angiotensin converting enzyme inhibitor (ACE-I)    Avascular necrosis (HCC)    Decreased GFR    DM (diabetes mellitus), type 2 (HCC)    no meds   Esophagitis    Goiter    s/p thyroidectomy   History of colonic polyps    Hypercholesterolemia    Hypertension    no current medications   Hypothyroidism    Marijuana use    Recurrent sinus infections    and allergy problems   Syncopal episodes    last one 12-16-22   Temporal arteritis (HCC)    Tobacco use    Uterine fibroid      Past Surgical History:  Procedure Laterality Date   ARTERY BIOPSY Right 03/13/2021   Procedure: BIOPSY TEMPORAL ARTERY;  Surgeon: Earline Mayotte, MD;  Location: [facility] ORS;  Service: General;  Laterality:  Right;   BREAST BIOPSY     COLONOSCOPY WITH PROPOFOL N/A 11/03/2014   Procedure: COLONOSCOPY WITH PROPOFOL;  Surgeon: Scot Jun, MD;  Location: [facility] ENDOSCOPY;  Service: Endoscopy;  Laterality: N/A;   COLONOSCOPY WITH PROPOFOL N/A 02/12/2018   Procedure: COLONOSCOPY WITH PROPOFOL;  Surgeon: Scot Jun, MD;  Location: [facility] ENDOSCOPY;  Service: Endoscopy;  Laterality: N/A;   DILATION AND  CURETTAGE OF UTERUS  1980 & 1993   ESOPHAGOGASTRODUODENOSCOPY N/A 11/03/2014   Procedure: ESOPHAGOGASTRODUODENOSCOPY (EGD);  Surgeon: Scot Jun, MD;  Location: [facility] ENDOSCOPY;  Service: Endoscopy;  Laterality: N/A;   LAPAROSCOPIC APPENDECTOMY N/A 11/26/2014   Procedure: APPENDECTOMY LAPAROSCOPIC;  Surgeon: Earline Mayotte, MD;  Location: [facility] ORS;  Service: General;  Laterality: N/A;   thyroidectomy  1979   goiter   TUBAL LIGATION  1975    Social History   Socioeconomic History   Marital status: Married    Spouse name: Not on file   Number of children: Not on file   Years of education: Not on file   Highest education level: Not on file  Occupational History   Not on file  Tobacco Use   Smoking status: Former    Current packs/day: 0.00    Types: Cigarettes    Quit date: 04/16/2009    Years since quitting: 13.7   Smokeless tobacco: Never  Vaping Use   Vaping status: Never Used  Substance and Sexual Activity   Alcohol use: Yes    Alcohol/week: 3.0 - 4.0 standard drinks of alcohol    Types: 3 - 4 Standard drinks or equivalent per week    Comment: rare   Drug use: Yes    Types: Marijuana    Comment: marijuana every day   Sexual activity: Not on file  Other Topics Concern   Not on file  Social History Narrative   Not on file   Social Determinants of Health   Financial Resource Strain: Low Risk  (05/25/2022)   Overall Financial Resource Strain (CARDIA)    Difficulty of Paying Living Expenses: Not hard at all  Food Insecurity: No Food Insecurity (10/31/2022)   Hunger Vital Sign    Worried About Running Out of Food in the Last Year: Never true    Ran Out of Food in the Last Year: Never true  Transportation Needs: No Transportation Needs (10/31/2022)   PRAPARE - Administrator, Civil Service (Medical): No    Lack of Transportation (Non-Medical): No  Physical Activity: Insufficiently Active (05/25/2022)   Exercise Vital Sign    Days of Exercise per Week: 3  days    Minutes of Exercise per Session: 30 min  Stress: No Stress Concern Present (05/25/2022)   [employer] of Occupational Health - Occupational Stress Questionnaire    Feeling of Stress : Not at all  Social Connections: Unknown (05/25/2022)   Social Connection and Isolation Panel [NHANES]    Frequency of Communication with Friends and Family: More than three times a week    Frequency of Social Gatherings with Friends and Family: More than three times a week    Attends Religious Services: Not on file    Active Member of Clubs or Organizations: Not on file    Attends Banker Meetings: Not on file    Marital Status: Not on file  Intimate Partner Violence: Not At Risk (10/31/2022)   Humiliation, Afraid, Rape, and Kick questionnaire    Fear of Current or Ex-Partner: No  Emotionally Abused: No    Physically Abused: No    Sexually Abused: No    Family History  Problem Relation Age of Onset   Tuberculosis Paternal Grandfather    Alzheimer's disease Mother    Goiter Mother    Cancer Brother    Cancer Brother    Cancer Brother    Cancer Brother      Current Outpatient Medications:    cyanocobalamin 1000 MCG tablet, Take 1,000 mcg by mouth daily., Disp: , Rfl:    ferrous sulfate 325 (65 FE) MG tablet, Take 325 mg by mouth daily with breakfast., Disp: , Rfl:    levothyroxine (SYNTHROID) 50 MCG tablet, Take 1 tablet (50 mcg total) by mouth daily. (Patient taking differently: Take 50 mcg by mouth daily before breakfast.), Disp: 90 tablet, Rfl: 1   rosuvastatin (CRESTOR) 10 MG tablet, TAKE 1 TABLET BY MOUTH EVERY DAY (Patient taking differently: Take 10 mg by mouth every evening.), Disp: 90 tablet, Rfl: 3   sulfamethoxazole-trimethoprim (BACTRIM DS) 800-160 MG tablet, Take 1 tablet by mouth 2 (two) times daily for 3 days., Disp: 6 tablet, Rfl: 0  Physical exam:  Vitals:   01/01/23 1140  BP: 112/61  Pulse: 71  Resp: 18  Temp: (!) 96.1 F (35.6 C)  TempSrc: Tympanic   SpO2: 100%  Weight: 110 lb (49.9 kg)   Physical Exam Cardiovascular:     Rate and Rhythm: Normal rate and regular rhythm.     Heart sounds: Normal heart sounds.  Pulmonary:     Effort: Pulmonary effort is normal.     Breath sounds: Normal breath sounds.  Abdominal:     General: Bowel sounds are normal.     Palpations: Abdomen is soft.  Skin:    General: Skin is warm and dry.  Neurological:     Mental Status: She is alert and oriented to person, place, and time.         Latest Ref Rng & Units 12/19/2022   12:06 PM  CMP  Glucose 70 - 99 mg/dL 82   BUN 6 - 23 mg/dL 14   Creatinine 8.29 - 1.20 mg/dL 5.62   Sodium 130 - 865 mEq/L 136   Potassium 3.5 - 5.1 mEq/L 4.2   Chloride 96 - 112 mEq/L 99   CO2 19 - 32 mEq/L 30   Calcium 8.4 - 10.5 mg/dL 9.4   Total Protein 6.0 - 8.3 g/dL 7.7   Total Bilirubin 0.2 - 1.2 mg/dL 0.3   Alkaline Phos 39 - 117 U/L 83   AST 0 - 37 U/L 12   ALT 0 - 35 U/L 7       Latest Ref Rng & Units 01/01/2023   10:57 AM  CBC  WBC 4.0 - 10.5 K/uL 5.4   Hemoglobin 12.0 - 15.0 g/dL 78.4   Hematocrit 69.6 - 46.0 % 33.7   Platelets 150 - 400 K/uL 344     No images are attached to the encounter.  CT ABDOMEN PELVIS W CONTRAST  Result Date: 12/09/2022 CLINICAL DATA:  Iron deficiency anemia and unintentional weight loss. EXAM: CT ABDOMEN AND PELVIS WITH CONTRAST TECHNIQUE: Multidetector CT imaging of the abdomen and pelvis was performed using the standard protocol following bolus administration of intravenous contrast. RADIATION DOSE REDUCTION: This exam was performed according to the departmental dose-optimization program which includes automated exposure control, adjustment of the mA and/or kV according to patient size and/or use of iterative reconstruction technique. CONTRAST:  OMNIPAQUE IOHEXOL 300  MG/ML  SOLN COMPARISON:  None Available. FINDINGS: Lower chest: The visualized lung bases are clear. No intra-abdominal free air or free fluid.  Hepatobiliary: Multiple liver cysts and additional subcentimeter hypodense lesions which are too small to characterize. No biliary ductal dilatation. Small gallstones. No pericholecystic fluid or evidence of acute cholecystitis by CT. Pancreas: Unremarkable. No pancreatic ductal dilatation or surrounding inflammatory changes. Spleen: Normal in size without focal abnormality. Adrenals/Urinary Tract: The adrenal glands are unremarkable. There is a 3.5 cm left renal inferior pole cyst and additional subcentimeter hypodense lesions which are too small to characterize. There is no hydronephrosis on either side. There is symmetric enhancement and excretion of contrast by both kidneys. The urinary bladder is grossly unremarkable. Stomach/Bowel: Oral contrast opacifies several loops of small bowel and colon. There is no bowel obstruction or active inflammation. The appendix is not visualized with certainty. No inflammatory changes identified in the right lower quadrant. Vascular/Lymphatic: Moderate aortoiliac atherosclerotic disease. The IVC is unremarkable. No portal venous gas. There is no adenopathy. Reproductive: The uterus is anteverted. Calcified uterine fibroid. No adnexal masses. Other: None Musculoskeletal: Degenerative changes of the spine. 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 IMPRESSION: 1. No acute intra-abdominal or pelvic pathology. 2. Cholelithiasis. 3. 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 4.  Aortic Atherosclerosis (ICD10-I70.0). Electronically Signed   By: Elgie Collard M.D.   On: 12/09/2022 00:56     Assessment and plan- Patient is a 77 y.o. female referred for normocytic anemia likely combination of anemia of chronic disease and iron deficiency  Patient's hemoglobin was down to 8.32 months ago and after receiving IV iron has come back up to 10.8.  I suspect she has a component of anemia of chronic  disease.  It is okay for her to proceed with left hip replacement surgery from a hematologic standpoint at this time.  CBC ferritin and iron studies in 3 and 6 months and I will see her back in 6 months   Visit Diagnosis 1. Other iron deficiency anemia   2. Anemia of chronic disease      Dr. Owens Shark, MD, MPH [facility] [phone] 01/01/2023 1:38 PM

## 2023-01-03 ENCOUNTER — Other Ambulatory Visit: Payer: Self-pay

## 2023-01-03 ENCOUNTER — Encounter
Admission: RE | Disposition: A | Discharge status: Home / Self Care | Payer: Self-pay | Source: Home / Self Care | Attending: Orthopedic Surgery

## 2023-01-03 ENCOUNTER — Ambulatory Visit: Payer: No Typology Code available for payment source | Admitting: Urgent Care

## 2023-01-03 ENCOUNTER — Observation Stay
Admission: RE | Admit: 2023-01-03 | Discharge: 2023-01-04 | Disposition: A | Discharge status: Home / Self Care | Payer: No Typology Code available for payment source | Attending: Orthopedic Surgery | Admitting: Orthopedic Surgery

## 2023-01-03 ENCOUNTER — Ambulatory Visit: Payer: No Typology Code available for payment source

## 2023-01-03 ENCOUNTER — Encounter: Payer: Self-pay | Admitting: Orthopedic Surgery

## 2023-01-03 DIAGNOSIS — E039 Hypothyroidism, unspecified: Secondary | ICD-10-CM | POA: Diagnosis not present

## 2023-01-03 DIAGNOSIS — M87852 Other osteonecrosis, left femur: Secondary | ICD-10-CM | POA: Insufficient documentation

## 2023-01-03 DIAGNOSIS — M1612 Unilateral primary osteoarthritis, left hip: Secondary | ICD-10-CM | POA: Diagnosis not present

## 2023-01-03 DIAGNOSIS — Z79899 Other long term (current) drug therapy: Secondary | ICD-10-CM | POA: Insufficient documentation

## 2023-01-03 DIAGNOSIS — E119 Type 2 diabetes mellitus without complications: Secondary | ICD-10-CM | POA: Insufficient documentation

## 2023-01-03 DIAGNOSIS — I10 Essential (primary) hypertension: Secondary | ICD-10-CM | POA: Insufficient documentation

## 2023-01-03 DIAGNOSIS — Z96642 Presence of left artificial hip joint: Principal | ICD-10-CM | POA: Diagnosis present

## 2023-01-03 DIAGNOSIS — Z01818 Encounter for other preprocedural examination: Secondary | ICD-10-CM

## 2023-01-03 DIAGNOSIS — M87052 Idiopathic aseptic necrosis of left femur: Secondary | ICD-10-CM | POA: Diagnosis not present

## 2023-01-03 HISTORY — PX: TOTAL HIP ARTHROPLASTY: SHX124

## 2023-01-03 LAB — GLUCOSE, CAPILLARY
Glucose-Capillary: 103 mg/dL — ABNORMAL HIGH (ref 70–99)
Glucose-Capillary: 119 mg/dL — ABNORMAL HIGH (ref 70–99)
Glucose-Capillary: 62 mg/dL — ABNORMAL LOW (ref 70–99)
Glucose-Capillary: 214 mg/dL — ABNORMAL HIGH (ref 70–99)

## 2023-01-03 LAB — ABO/RH: ABO/RH(D): A POS

## 2023-01-03 SURGERY — ARTHROPLASTY, HIP, TOTAL, ANTERIOR APPROACH
Anesthesia: Spinal | Site: Hip | Laterality: Left

## 2023-01-03 MED ORDER — ROSUVASTATIN CALCIUM 20 MG PO TABS
ORAL_TABLET | ORAL | Status: AC
  Filled 2023-01-03: qty 1

## 2023-01-03 MED ORDER — KETOROLAC TROMETHAMINE 15 MG/ML IJ SOLN
INTRAMUSCULAR | Status: AC
  Filled 2023-01-03: qty 1

## 2023-01-03 MED ORDER — MORPHINE SULFATE (PF) 4 MG/ML IV SOLN: 0.5000 mg | INTRAVENOUS | Status: DC | PRN

## 2023-01-03 MED ORDER — BUPIVACAINE HCL (PF) 0.5 % IJ SOLN
INTRAMUSCULAR | Status: DC | PRN
  Administered 2023-01-03: 2.1 mL via INTRATHECAL

## 2023-01-03 MED ORDER — BUPIVACAINE-EPINEPHRINE (PF) 0.25% -1:200000 IJ SOLN
INTRAMUSCULAR | Status: AC
  Filled 2023-01-03: qty 30

## 2023-01-03 MED ORDER — ACETAMINOPHEN 10 MG/ML IV SOLN
INTRAVENOUS | Status: AC
  Filled 2023-01-03: qty 100

## 2023-01-03 MED ORDER — SODIUM CHLORIDE 0.9 % IV SOLN: INTRAVENOUS | Status: DC

## 2023-01-03 MED ORDER — SURGIPHOR WOUND IRRIGATION SYSTEM - OPTIME: TOPICAL | Status: DC | PRN

## 2023-01-03 MED ORDER — DEXAMETHASONE SODIUM PHOSPHATE 10 MG/ML IJ SOLN
INTRAMUSCULAR | Status: AC
  Filled 2023-01-03: qty 1

## 2023-01-03 MED ORDER — ACETAMINOPHEN 500 MG PO TABS
1000.0000 mg | ORAL_TABLET | Freq: Three times a day (TID) | ORAL | Status: DC
  Administered 2023-01-03 – 2023-01-04 (×2): 1000 mg via ORAL

## 2023-01-03 MED ORDER — FENTANYL CITRATE (PF) 100 MCG/2ML IJ SOLN
INTRAMUSCULAR | Status: AC
  Filled 2023-01-03: qty 2

## 2023-01-03 MED ORDER — PROPOFOL 1000 MG/100ML IV EMUL
INTRAVENOUS | Status: AC
  Filled 2023-01-03: qty 100

## 2023-01-03 MED ORDER — ORAL CARE MOUTH RINSE: 15.0000 mL | Freq: Once | OROMUCOSAL | Status: AC

## 2023-01-03 MED ORDER — DOCUSATE SODIUM 100 MG PO CAPS
ORAL_CAPSULE | ORAL | Status: AC
  Filled 2023-01-03: qty 1

## 2023-01-03 MED ORDER — ONDANSETRON HCL 4 MG/2ML IJ SOLN
INTRAMUSCULAR | Status: AC
  Filled 2023-01-03: qty 2

## 2023-01-03 MED ORDER — SODIUM CHLORIDE (PF) 0.9 % IJ SOLN
INTRAMUSCULAR | Status: DC | PRN
  Administered 2023-01-03: 50 mL via INTRAMUSCULAR

## 2023-01-03 MED ORDER — MENTHOL 3 MG MT LOZG: 1.0000 | LOZENGE | OROMUCOSAL | Status: DC | PRN

## 2023-01-03 MED ORDER — SURGIFLO WITH THROMBIN (HEMOSTATIC MATRIX KIT) OPTIME
TOPICAL | Status: DC | PRN
  Administered 2023-01-03: 1 via TOPICAL

## 2023-01-03 MED ORDER — SODIUM CHLORIDE FLUSH 0.9 % IV SOLN
INTRAVENOUS | Status: AC
  Filled 2023-01-03: qty 10

## 2023-01-03 MED ORDER — MIDAZOLAM HCL 2 MG/2ML IJ SOLN
INTRAMUSCULAR | Status: AC
  Filled 2023-01-03: qty 2

## 2023-01-03 MED ORDER — ESMOLOL HCL 100 MG/10ML IV SOLN
INTRAVENOUS | Status: DC | PRN
Start: 2023-01-03 — End: 2023-01-03
  Administered 2023-01-03 (×3): 10 mg via INTRAVENOUS

## 2023-01-03 MED ORDER — CHLORHEXIDINE GLUCONATE 0.12 % MT SOLN
OROMUCOSAL | Status: AC
  Filled 2023-01-03: qty 15

## 2023-01-03 MED ORDER — MIDAZOLAM HCL 5 MG/5ML IJ SOLN
INTRAMUSCULAR | Status: DC | PRN
  Administered 2023-01-03 (×2): 1 mg via INTRAVENOUS

## 2023-01-03 MED ORDER — ESMOLOL HCL 100 MG/10ML IV SOLN
INTRAVENOUS | Status: AC
  Filled 2023-01-03: qty 10

## 2023-01-03 MED ORDER — CHLORHEXIDINE GLUCONATE 0.12 % MT SOLN
15.0000 mL | Freq: Once | OROMUCOSAL | Status: AC
  Administered 2023-01-03: 15 mL via OROMUCOSAL

## 2023-01-03 MED ORDER — FENTANYL CITRATE (PF) 100 MCG/2ML IJ SOLN: 25.0000 ug | INTRAMUSCULAR | Status: DC | PRN

## 2023-01-03 MED ORDER — ACETAMINOPHEN 500 MG PO TABS
ORAL_TABLET | ORAL | Status: AC
  Filled 2023-01-03: qty 2

## 2023-01-03 MED ORDER — ONDANSETRON HCL 4 MG/2ML IJ SOLN
INTRAMUSCULAR | Status: DC | PRN
  Administered 2023-01-03: 4 mg via INTRAVENOUS

## 2023-01-03 MED ORDER — BUPIVACAINE LIPOSOME 1.3 % IJ SUSP
INTRAMUSCULAR | Status: AC
  Filled 2023-01-03: qty 20

## 2023-01-03 MED ORDER — BUPIVACAINE HCL (PF) 0.5 % IJ SOLN
INTRAMUSCULAR | Status: AC
  Filled 2023-01-03: qty 10

## 2023-01-03 MED ORDER — CEFAZOLIN SODIUM-DEXTROSE 2-4 GM/100ML-% IV SOLN
2.0000 g | Freq: Three times a day (TID) | INTRAVENOUS | Status: AC
  Administered 2023-01-03 – 2023-01-04 (×2): 2 g via INTRAVENOUS

## 2023-01-03 MED ORDER — EPHEDRINE SULFATE (PRESSORS) 50 MG/ML IJ SOLN
INTRAMUSCULAR | Status: DC | PRN
  Administered 2023-01-03: 5 mg via INTRAVENOUS

## 2023-01-03 MED ORDER — ONDANSETRON HCL 4 MG/2ML IJ SOLN: 4.0000 mg | Freq: Four times a day (QID) | INTRAMUSCULAR | Status: DC | PRN

## 2023-01-03 MED ORDER — KETOROLAC TROMETHAMINE 15 MG/ML IJ SOLN
7.5000 mg | Freq: Four times a day (QID) | INTRAMUSCULAR | Status: DC
  Administered 2023-01-03 – 2023-01-04 (×3): 7.5 mg via INTRAVENOUS

## 2023-01-03 MED ORDER — PHENYLEPHRINE HCL-NACL 20-0.9 MG/250ML-% IV SOLN
INTRAVENOUS | Status: DC | PRN
  Administered 2023-01-03: 80 ug/min via INTRAVENOUS

## 2023-01-03 MED ORDER — DOCUSATE SODIUM 100 MG PO CAPS
100.0000 mg | ORAL_CAPSULE | Freq: Two times a day (BID) | ORAL | Status: DC
  Administered 2023-01-03 – 2023-01-04 (×2): 100 mg via ORAL

## 2023-01-03 MED ORDER — FERROUS SULFATE 325 (65 FE) MG PO TABS
325.0000 mg | ORAL_TABLET | Freq: Every day | ORAL | Status: DC
  Administered 2023-01-04: 325 mg via ORAL

## 2023-01-03 MED ORDER — ONDANSETRON HCL 4 MG PO TABS: 4.0000 mg | ORAL_TABLET | Freq: Four times a day (QID) | ORAL | Status: DC | PRN

## 2023-01-03 MED ORDER — PHENOL 1.4 % MT LIQD: 1.0000 | OROMUCOSAL | Status: DC | PRN

## 2023-01-03 MED ORDER — DEXAMETHASONE SODIUM PHOSPHATE 10 MG/ML IJ SOLN
8.0000 mg | Freq: Once | INTRAMUSCULAR | Status: AC
  Administered 2023-01-03: 8 mg via INTRAVENOUS

## 2023-01-03 MED ORDER — SODIUM CHLORIDE 0.9 % IR SOLN
Status: DC | PRN
  Administered 2023-01-03: 100 mL

## 2023-01-03 MED ORDER — TRAMADOL HCL 50 MG PO TABS: 50.0000 mg | ORAL_TABLET | Freq: Four times a day (QID) | ORAL | Status: DC | PRN

## 2023-01-03 MED ORDER — TRANEXAMIC ACID-NACL 1000-0.7 MG/100ML-% IV SOLN
INTRAVENOUS | Status: AC
  Filled 2023-01-03: qty 100

## 2023-01-03 MED ORDER — LIDOCAINE HCL (PF) 2 % IJ SOLN
INTRAMUSCULAR | Status: AC
  Filled 2023-01-03: qty 5

## 2023-01-03 MED ORDER — HYDROCODONE-ACETAMINOPHEN 5-325 MG PO TABS: 1.0000 | ORAL_TABLET | ORAL | Status: DC | PRN

## 2023-01-03 MED ORDER — LEVOTHYROXINE SODIUM 50 MCG PO TABS
50.0000 ug | ORAL_TABLET | Freq: Every day | ORAL | Status: DC
  Administered 2023-01-04: 50 ug via ORAL
  Filled 2023-01-03: qty 1

## 2023-01-03 MED ORDER — FENTANYL CITRATE (PF) 100 MCG/2ML IJ SOLN
INTRAMUSCULAR | Status: DC | PRN
  Administered 2023-01-03 (×3): 25 ug via INTRAVENOUS

## 2023-01-03 MED ORDER — CEFAZOLIN SODIUM-DEXTROSE 2-4 GM/100ML-% IV SOLN
INTRAVENOUS | Status: AC
  Filled 2023-01-03: qty 100

## 2023-01-03 MED ORDER — SODIUM CHLORIDE 0.9 % IR SOLN
Status: DC | PRN
Start: 2023-01-03 — End: 2023-01-03
  Administered 2023-01-03: 500 mL

## 2023-01-03 MED ORDER — PROPOFOL 10 MG/ML IV BOLUS
INTRAVENOUS | Status: DC | PRN
  Administered 2023-01-03: 10 mg via INTRAVENOUS
  Administered 2023-01-03: 50 ug/kg/min via INTRAVENOUS

## 2023-01-03 MED ORDER — ROSUVASTATIN CALCIUM 20 MG PO TABS
10.0000 mg | ORAL_TABLET | Freq: Every evening | ORAL | Status: DC
  Administered 2023-01-03: 10 mg via ORAL

## 2023-01-03 MED ORDER — FAMOTIDINE 20 MG PO TABS
20.0000 mg | ORAL_TABLET | Freq: Once | ORAL | Status: AC
  Administered 2023-01-03: 20 mg via ORAL

## 2023-01-03 MED ORDER — METOCLOPRAMIDE HCL 10 MG PO TABS: 5.0000 mg | ORAL_TABLET | Freq: Three times a day (TID) | ORAL | Status: DC | PRN

## 2023-01-03 MED ORDER — ENOXAPARIN SODIUM 40 MG/0.4ML IJ SOSY
40.0000 mg | PREFILLED_SYRINGE | INTRAMUSCULAR | Status: DC
  Administered 2023-01-04: 40 mg via SUBCUTANEOUS

## 2023-01-03 MED ORDER — DEXMEDETOMIDINE HCL IN NACL 80 MCG/20ML IV SOLN
INTRAVENOUS | Status: DC | PRN
Start: 2023-01-03 — End: 2023-01-03
  Administered 2023-01-03: 8 ug via INTRAVENOUS

## 2023-01-03 MED ORDER — PHENYLEPHRINE HCL (PRESSORS) 10 MG/ML IV SOLN
INTRAVENOUS | Status: AC
  Filled 2023-01-03: qty 1

## 2023-01-03 MED ORDER — ACETAMINOPHEN 10 MG/ML IV SOLN
INTRAVENOUS | Status: DC | PRN
  Administered 2023-01-03: 800 mg via INTRAVENOUS

## 2023-01-03 MED ORDER — METOCLOPRAMIDE HCL 5 MG/ML IJ SOLN: 5.0000 mg | Freq: Three times a day (TID) | INTRAMUSCULAR | Status: DC | PRN

## 2023-01-03 MED ORDER — DEXMEDETOMIDINE HCL IN NACL 80 MCG/20ML IV SOLN
INTRAVENOUS | Status: AC
  Filled 2023-01-03: qty 20

## 2023-01-03 MED ORDER — EPHEDRINE 5 MG/ML INJ
INTRAVENOUS | Status: AC
  Filled 2023-01-03: qty 5

## 2023-01-03 MED ORDER — LIDOCAINE HCL (CARDIAC) PF 100 MG/5ML IV SOSY
PREFILLED_SYRINGE | INTRAVENOUS | Status: DC | PRN
  Administered 2023-01-03: 40 mg via INTRAVENOUS

## 2023-01-03 MED ORDER — TRANEXAMIC ACID-NACL 1000-0.7 MG/100ML-% IV SOLN
1000.0000 mg | INTRAVENOUS | Status: AC
  Administered 2023-01-03 (×2): 1000 mg via INTRAVENOUS
  Filled 2023-01-03: qty 100

## 2023-01-03 MED ORDER — CEFAZOLIN SODIUM-DEXTROSE 2-4 GM/100ML-% IV SOLN
2.0000 g | INTRAVENOUS | Status: AC
  Administered 2023-01-03: 2 g via INTRAVENOUS

## 2023-01-03 MED ORDER — FAMOTIDINE 20 MG PO TABS
ORAL_TABLET | ORAL | Status: AC
  Filled 2023-01-03: qty 1

## 2023-01-03 MED ORDER — PANTOPRAZOLE SODIUM 40 MG PO TBEC
40.0000 mg | DELAYED_RELEASE_TABLET | Freq: Every day | ORAL | Status: DC
  Administered 2023-01-03 – 2023-01-04 (×2): 40 mg via ORAL

## 2023-01-03 MED ORDER — PANTOPRAZOLE SODIUM 40 MG PO TBEC
DELAYED_RELEASE_TABLET | ORAL | Status: AC
  Filled 2023-01-03: qty 1

## 2023-01-03 SURGICAL SUPPLY — 71 items
ADH SKN CLS APL DERMABOND .7 (GAUZE/BANDAGES/DRESSINGS) ×1
AGENT HMST KT MTR STRL THRMB (HEMOSTASIS) ×1
APL PRP STRL LF DISP 70% ISPRP (MISCELLANEOUS) ×1
BLADE SAGITTAL AGGR TOOTH XLG (BLADE) ×1 IMPLANT
BNDG CMPR 5X6 CHSV STRCH STRL (GAUZE/BANDAGES/DRESSINGS) ×2
BNDG COHESIVE 6X5 TAN ST LF (GAUZE/BANDAGES/DRESSINGS) ×2 IMPLANT
CHLORAPREP W/TINT 26 (MISCELLANEOUS) ×1 IMPLANT
DERMABOND ADVANCED .7 DNX12 (GAUZE/BANDAGES/DRESSINGS) ×1 IMPLANT
DRAPE C-ARM XRAY 36X54 (DRAPES) ×1 IMPLANT
DRAPE POUCH INSTRU U-SHP 10X18 (DRAPES) ×1 IMPLANT
DRAPE SHEET LG 3/4 BI-LAMINATE (DRAPES) ×3 IMPLANT
DRAPE TABLE BACK 80X90 (DRAPES) ×1 IMPLANT
DRSG MEPILEX SACRM 8.7X9.8 (GAUZE/BANDAGES/DRESSINGS) ×1 IMPLANT
DRSG OPSITE POSTOP 4X8 (GAUZE/BANDAGES/DRESSINGS) ×1 IMPLANT
ELECT BLADE 4.0 EZ CLEAN MEGAD (MISCELLANEOUS) ×1
ELECT REM PT RETURN 9FT ADLT (ELECTROSURGICAL) ×1
ELECTRODE BLDE 4.0 EZ CLN MEGD (MISCELLANEOUS) ×1 IMPLANT
ELECTRODE REM PT RTRN 9FT ADLT (ELECTROSURGICAL) ×1 IMPLANT
GLOVE BIO SURGEON STRL SZ8 (GLOVE) ×1 IMPLANT
GLOVE BIOGEL PI IND STRL 8 (GLOVE) ×1 IMPLANT
GLOVE PI ORTHO PRO STRL 7.5 (GLOVE) ×2 IMPLANT
GLOVE PI ORTHO PRO STRL SZ8 (GLOVE) ×2 IMPLANT
GLOVE SURG SYN 7.5 E (GLOVE) ×1 IMPLANT
GLOVE SURG SYN 7.5 PF PI (GLOVE) ×1 IMPLANT
GOWN SRG XL LVL 3 NONREINFORCE (GOWNS) ×1 IMPLANT
GOWN STRL NON-REIN TWL XL LVL3 (GOWNS) ×1
GOWN STRL REUS W/ TWL LRG LVL3 (GOWN DISPOSABLE) ×1 IMPLANT
GOWN STRL REUS W/ TWL XL LVL3 (GOWN DISPOSABLE) ×1 IMPLANT
GOWN STRL REUS W/TWL LRG LVL3 (GOWN DISPOSABLE) ×1
GOWN STRL REUS W/TWL XL LVL3 (GOWN DISPOSABLE) ×1
HANDLE YANKAUER SUCT OPEN TIP (MISCELLANEOUS) ×1 IMPLANT
HEAD BIOLOX HIP 36/-5 (Joint) IMPLANT
HIP BIOLOX HD 36/-5 (Joint) ×1 IMPLANT
HOOD PEEL AWAY T7 (MISCELLANEOUS) ×2 IMPLANT
INSERT 0 DEGREE 36 (Miscellaneous) IMPLANT
IV NS 100ML SINGLE PACK (IV SOLUTION) ×1 IMPLANT
KIT PATIENT CARE HANA TABLE (KITS) ×1 IMPLANT
LIGHT WAVEGUIDE WIDE FLAT (MISCELLANEOUS) ×1 IMPLANT
MANIFOLD NEPTUNE II (INSTRUMENTS) ×1 IMPLANT
MARKER SKIN DUAL TIP RULER LAB (MISCELLANEOUS) ×1 IMPLANT
MAT ABSORB FLUID 56X50 GRAY (MISCELLANEOUS) ×1 IMPLANT
NDL FILTER BLUNT 18X1 1/2 (NEEDLE) ×1 IMPLANT
NDL SAFETY ECLIP 18X1.5 (MISCELLANEOUS) ×1 IMPLANT
NDL SPNL 20GX3.5 QUINCKE YW (NEEDLE) ×1 IMPLANT
NEEDLE FILTER BLUNT 18X1 1/2 (NEEDLE) IMPLANT
NEEDLE SPNL 20GX3.5 QUINCKE YW (NEEDLE) ×1 IMPLANT
NS IRRIG 500ML POUR BTL (IV SOLUTION) ×1 IMPLANT
PACK HIP COMPR (MISCELLANEOUS) ×1 IMPLANT
PAD ARMBOARD 7.5X6 YLW CONV (MISCELLANEOUS) ×1 IMPLANT
SCREW HEX LP 6.5X20 (Screw) IMPLANT
SCREW HEX LP 6.5X30 (Screw) IMPLANT
SHELL ACETAB TRIDENT 48 (Shell) IMPLANT
SLEEVE SCD COMPRESS KNEE MED (STOCKING) ×1 IMPLANT
SOLUTION IRRIG SURGIPHOR (IV SOLUTION) ×1 IMPLANT
STEM HIGH OFFSET SZ3 36X101 (Stem) IMPLANT
SURGIFLO W/THROMBIN 8M KIT (HEMOSTASIS) IMPLANT
SUT BONE WAX W31G (SUTURE) ×1 IMPLANT
SUT DVC 2 QUILL PDO T11 36X36 (SUTURE) ×1 IMPLANT
SUT ETHIBOND 2 V 37 (SUTURE) ×1 IMPLANT
SUT QUILL MONODERM 3-0 PS-2 (SUTURE) ×1 IMPLANT
SUT SILK 0 (SUTURE) ×1
SUT SILK 0 30XBRD TIE 6 (SUTURE) ×1 IMPLANT
SUT VIC AB 0 CT1 36 (SUTURE) ×1 IMPLANT
SUT VIC AB 2-0 CT2 27 (SUTURE) ×2 IMPLANT
SYR 30ML LL (SYRINGE) ×2 IMPLANT
SYR TB 1ML LL NO SAFETY (SYRINGE) ×1 IMPLANT
TAPE MICROFOAM 4IN (TAPE) IMPLANT
TOWEL OR 17X26 4PK STRL BLUE (TOWEL DISPOSABLE) IMPLANT
TRAP FLUID SMOKE EVACUATOR (MISCELLANEOUS) ×1 IMPLANT
WAND WEREWOLF FASTSEAL 6.0 (MISCELLANEOUS) ×1 IMPLANT
WATER STERILE IRR 1000ML POUR (IV SOLUTION) ×1 IMPLANT

## 2023-01-03 NOTE — Plan of Care (Signed)
Problem: Activity: Goal: Ability to avoid complications of mobility impairment will improve Outcome: Progressing   Problem: Pain Management: Goal: Pain level will decrease with appropriate interventions Outcome: Progressing

## 2023-01-03 NOTE — Interval H&P Note (Signed)
Patient history and physical updated. Consent reviewed including risks, benefits, and alternatives to surgery. Patient agrees with above plan to proceed with left anterior total hip arthroplasty. We did discuss and increased risk of blood transfusion given her history of anermia and she is ok with this risk.

## 2023-01-03 NOTE — Discharge Summary (Signed)
Physician Discharge Summary  [name] MRN: [id number] DOB/AGE: 06-23-1945 77 y.o.  Admit date: 01/03/2023 Discharge date: 01/04/2023  Admission Diagnoses:  S/P total left hip arthroplasty [X91.478] Left hip osteoarthritis  Discharge Diagnoses: Patient Active Problem List   Diagnosis Date Noted   S/P total left hip arthroplasty 01/03/2023   Absent pulse in upper extremity 12/31/2022   Iron deficiency anemia 11/01/2022   Avascular necrosis (HCC) 10/21/2022   Type 2 diabetes mellitus with hyperglycemia (HCC) 09/18/2022   Angioedema due to angiotensin converting enzyme inhibitor (ACE-I) 08/31/2022   Abnormal MRA, brain 08/27/2021   Hyperglycemia 08/17/2021   Decreased GFR 06/15/2021   Temporal arteritis (HCC) 03/27/2021   Long term current use of systemic steroids 03/17/2021   Long-term use of immunosuppressant medication 03/17/2021   Headache 02/22/2021   Stress 02/22/2021   Weight loss 09/19/2020   Breast cancer screening 10/11/2019   Hypercholesterolemia 04/27/2018   Pre-op evaluation 11/21/2014   History of colonic polyps 11/15/2014   Esophagitis 11/15/2014   Smoking 05/23/2014   Health care maintenance 05/23/2014   Syncope 09/04/2012   Essential hypertension, benign 09/04/2012   Hypothyroidism 09/04/2012   Anemia 09/04/2012   Uterine fibroid 09/04/2012    Past Medical History:  Diagnosis Date   Anemia    Angioedema due to angiotensin converting enzyme inhibitor (ACE-I)    Avascular necrosis (HCC)    Decreased GFR    DM (diabetes mellitus), type 2 (HCC)    no meds   Esophagitis    Goiter    s/p thyroidectomy   History of colonic polyps    Hypercholesterolemia    Hypertension    no current medications   Hypothyroidism    Marijuana use    Recurrent sinus infections    and allergy problems   Syncopal episodes    last one 12-16-22   Temporal arteritis (HCC)    Tobacco use    Uterine fibroid      Transfusion: None.   Consultants (if  any):   Discharged Condition: Improved  Hospital Course: Jessica Wall is an 77 y.o. female who was admitted 01/03/2023 with a diagnosis of left hip osteoarthritis and went to the operating room on 01/03/2023 and underwent the above named procedures.    Surgeries: Procedure(s): TOTAL HIP ARTHROPLASTY ANTERIOR APPROACH -RNFA on 01/03/2023 Patient tolerated the surgery well. Taken to PACU where she was stabilized and then transferred to the post op recovery area.  Started on Lovenox 40 q daily.  Heels elevated on bed with rolled towels. No evidence of DVT. Negative Homan. Physical therapy started on day #0 for gait training and transfer. OT started day #0 for ADL and assisted devices.  Patient's IV was removed on POD1.  Implants: Cup: Trident Tritanium Clusterhole 76mm/D w/ x2 screws    Liner: Neutral X3 poly 36/D  Stem: Insignia #3 High offset  Head:84mm -5mm biolox ceramic   She was given perioperative antibiotics:  Anti-infectives (From admission, onward)    Start     Dose/Rate Route Frequency Ordered Stop   01/03/23 2100  ceFAZolin (ANCEF) IVPB 2g/100 mL premix        2 g 200 mL/hr over 30 Minutes Intravenous Every 8 hours 01/03/23 1745 01/04/23 0530   01/03/23 1145  ceFAZolin (ANCEF) IVPB 2g/100 mL premix        2 g 200 mL/hr over 30 Minutes Intravenous On call to O.R. 01/03/23 1132 01/03/23 1352     .  She was given sequential compression  devices, early ambulation, and Lovenox for DVT prophylaxis.  She benefited maximally from the hospital stay and there were no complications.    Recent vital signs:  Vitals:   01/04/23 0622 01/04/23 0741  BP: 128/78 119/74  Pulse: 80 65  Resp: 16 18  Temp: 98.4 F (36.9 C) 98 F (36.7 C)  SpO2: 100% 100%    Recent laboratory studies:  Lab Results  Component Value Date   HGB 10.3 (L) 01/04/2023   HGB 10.8 (L) 01/01/2023   HGB 10.6 (L) 12/19/2022   Lab Results  Component Value Date   WBC 9.2 01/04/2023   PLT 332 01/04/2023    Lab Results  Component Value Date   INR 1.2 06/07/2021   Lab Results  Component Value Date   NA 136 01/04/2023   K 4.9 01/04/2023   CL 103 01/04/2023   CO2 24 01/04/2023   BUN 16 01/04/2023   CREATININE 1.05 (H) 01/04/2023   GLUCOSE 150 (H) 01/04/2023    Discharge Medications:   Allergies as of 01/04/2023       Reactions   Ace Inhibitors Swelling   Aspirin Swelling        Medication List     STOP taking these medications    sulfamethoxazole-trimethoprim 800-160 MG tablet Commonly known as: BACTRIM DS       TAKE these medications    acetaminophen 500 MG tablet Commonly known as: TYLENOL Take 2 tablets (1,000 mg total) by mouth every 6 (six) hours as needed.   cyanocobalamin 1000 MCG tablet Take 1,000 mcg by mouth daily.   enoxaparin 40 MG/0.4ML injection Commonly known as: LOVENOX Inject 0.4 mLs (40 mg total) into the skin daily.   ferrous sulfate 325 (65 FE) MG tablet Take 325 mg by mouth daily with breakfast.   levothyroxine 50 MCG tablet Commonly known as: SYNTHROID Take 1 tablet (50 mcg total) by mouth daily. What changed: when to take this   rosuvastatin 10 MG tablet Commonly known as: CRESTOR TAKE 1 TABLET BY MOUTH EVERY DAY What changed: when to take this   traMADol 50 MG tablet Commonly known as: ULTRAM Take 1 tablet (50 mg total) by mouth every 6 (six) hours as needed for moderate pain.               Durable Medical Equipment  (From admission, onward)           Start     Ordered   01/04/23 0917  For home use only DME Walker  Once       Question:  Patient needs a walker to treat with the following condition  Answer:  S/P hip replacement, left   01/04/23 0916            Diagnostic Studies: DG HIP UNILAT WITH PELVIS 1V LEFT  Result Date: 01/03/2023 CLINICAL DATA:  Elective surgery. EXAM: DG HIP (WITH OR WITHOUT PELVIS) 1V*L* COMPARISON:  None Available. FINDINGS: Three fluoroscopic spot views of the pelvis and left  hip obtained in the operating room. Images during hip arthroplasty. Fluoroscopy time 14 seconds. Dose 1.094 mGy. IMPRESSION: Intraoperative fluoroscopy during left hip arthroplasty. Electronically Signed   By: Narda Rutherford M.D.   On: 01/03/2023 16:08   DG C-Arm 1-60 Min-No Report  Result Date: 01/03/2023 Fluoroscopy was utilized by the requesting physician.  No radiographic interpretation.   DG C-Arm 1-60 Min-No Report  Result Date: 01/03/2023 Fluoroscopy was utilized by the requesting physician.  No radiographic interpretation.    Disposition:  Discharge disposition: 01-Home or Self Care     Plan for discharge home pending progress with PT.   Follow-up Information     Evon Slack, PA-C Follow up in 14 day(s).   Specialties: Orthopedic Surgery, Emergency Medicine Why: 01/18/2023 11:15 AM Contact information: [address] [place] [address] [phone]                Signed: Meriel Pica PA-C 01/04/2023, 12:03 PM

## 2023-01-03 NOTE — Anesthesia Procedure Notes (Signed)
Spinal  Patient location during procedure: OR Start time: 01/03/2023 1:24 PM End time: 01/03/2023 1:35 PM Reason for block: surgical anesthesia Staffing Performed: resident/CRNA  Resident/CRNA: Morene Crocker, CRNA Performed by: Morene Crocker, CRNA Authorized by: Lenard Simmer, MD   Preanesthetic Checklist Completed: patient identified, IV checked, site marked, risks and benefits discussed, surgical consent, monitors and equipment checked, pre-op evaluation and timeout performed Spinal Block Patient position: sitting Prep: ChloraPrep Patient monitoring: heart rate, continuous pulse ox and blood pressure Approach: midline Location: L3-4 Injection technique: single-shot Needle Needle type: Whitacre and Introducer  Needle gauge: 24 G Needle length: 9 cm Assessment Events: CSF return Additional Notes Negative paresthesia. Negative blood return. Positive free-flowing CSF. Expiration date of kit checked and confirmed. Patient tolerated procedure well, without complications. Successful on first attempt, no complications noted. Pt. Denies any pain/discomfort, tolerated spinal well.

## 2023-01-03 NOTE — H&P (Signed)
History of Present Illness: Jessica Wall is an 77 y.o. female presents for follow-up evaluation of her left hip prior to planned left anterior total hip replacement. The patient reports ongoing severe left groin pain which is now been going on for 8 months with no known injury. It worsened over time up to about a 10 out of 10 with any sitting or standing activities or trying to walk. Functionally limiting her on a daily basis to despite interventions. 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 She was taken off the steroids recently due to side effects. She also has a history of anemia and recently underwent iron infusions with good response and an elevation of her hemoglobin. The patient denies fevers, chills, numbness, tingling, shortness of breath, chest pain, recent illness, or any trauma.  Patient is a non-smoker, nondiabetic with an A1c of 6.6 and has a BMI of 22.5  Past Medical History: Past Medical History:  Diagnosis Date  Allergic state  and sinus problems  Colon polyp  GERD (gastroesophageal reflux disease)  Goiter  resulting hypothyroidism  Hyperlipidemia  Menopause  OA (osteoarthritis)  Tobacco dependence   Past Surgical History: Past Surgical History:  Procedure Laterality Date  BILATERAL TUBAL LIGATION 1975  HOSPITALIZED 1975  for salpingitis, endometritis secondary to an IUD  THYROIDECTOMY TOTAL 1979  COLONOSCOPY 11/03/2014  Adenomatous Polyps: CBF 10/2017; Recall ltr mailed 07/31/2017 (kj)  EGD 11/03/2014  No repeat per RTE  LAPAROSCOPIC APPENDECTOMY 11/26/2014  COLONOSCOPY 02/12/2018  Sessile serrated adenomas: CBF 01/2019 Recall ltr mailed  COLONOSCOPY 09/27/2020  Hyperplastic polyp/No repeat due to age/TKT  BIOPSY TEMPORAL ARTERY Right 03/13/2021  JWB  DILATION AND CURETTAGE, DIAGNOSTIC / THERAPEUTIC 1980 and 1993  INCISIONAL BIOPSY BREAST   Past Family History: Family History  Problem Relation Age  of Onset  Alzheimer's disease Mother  Goiter Mother  Stomach cancer Sister  Cancer Brother  Tuberculosis Paternal Grandfather  Breast cancer Neg Hx  Colon cancer Neg Hx   Medications: Current Outpatient Medications  Medication Sig Dispense Refill  amLODIPine (NORVASC) 5 MG tablet Take 1 tablet (5 mg total) by mouth once daily Patient takes a half pill daily.  cyanocobalamin (VITAMIN B12) 1000 MCG tablet Take 1,000 mcg by mouth once daily  ferrous sulfate 325 (65 FE) MG tablet Take 1 tablet (325 mg total) by mouth daily with breakfast  levothyroxine (SYNTHROID) 50 MCG tablet Take 1 tablet (50 mcg total) by mouth once daily  rosuvastatin (CRESTOR) 10 MG tablet Take 1 tablet by mouth once daily   No current facility-administered medications for this visit.   Allergies: Allergies  Allergen Reactions  Aspirin Swelling    Visit Vitals: There were no vitals filed for this visit.   Review of Systems:  A comprehensive 14 point ROS was performed, reviewed, and the pertinent orthopaedic findings are documented in the HPI.  Physical Exam: There is no height or weight on file to calculate BMI. General/Constitutional: No apparent distress: well-nourished and well developed. Lymphatic: No palpable adenopathy. Pulmonary exam: Lungs clear to auscultation bilaterally no wheezing rales or rhonchi Cardiac exam: Regular rate and rhythm no obvious murmurs rubs or gallops. Vascular: No edema, swelling or tenderness, except as noted in detailed exam. Integumentary: No impressive skin lesions present, except as noted in detailed exam. Neuro/Psych: Normal mood and affect, oriented to person, place and time. Musculoskeletal: Normal, except as noted in detailed exam and in HPI.  Left hip exam  SKIN: intact SWELLING:  none WARMTH: no warmth TENDERNESS: none, Stinchfield Positive ROM: 0 degrees internal rotation and 20 degrees external rotation and pain with internal rotation, localized to the  groin; Hip Flexion 80 STRENGTH: limited by pain GAIT: antalgic with a walking stick STABILITY: stable to testing CREPITUS: yes LEG LENGTH DISCREPANCY: right longer by .5 cm NEUROLOGICAL EXAM: normal VASCULAR EXAM: normal LUMBAR SPINE: tenderness: no straight leg raising sign: no motor exam: normal  The contralateral hip was examined for comparison and it showed: TENDERNESS: none ROM: normal and full STRENGTH: normal STABILITY: stable to testing  Hip Imaging :  I reviewed AP pelvis and lateral left hip x-rays performed on 10/10/2022 images and report reviewed by myself. Bilateral hips show evidence of mixed sclerotic and lucent lesions consistent with avascular necrosis. The left hip is significantly worse with an area of subchondral collapse along the superior dome of the femoral head with multiple cystic changes medial joint space narrowing and sclerosis and osteophyte formation on the acetabulum. There is also an infested fight off the tip of the greater trochanter. The right hip shows the above-noted mixed serpiginous lytic and sclerotic lesions with no evidence of any collapse. No fractures or dislocations noted. There is a calcified body within the pelvis consistent with a fibroid. Agree with radiologist interpretation.  AP pelvis and crosstable lateral sit/stand spinal pelvic x-rays taken at the last office visit. Show redemonstration of left hip femoral head collapse with avascular necrosis and changes in the right femoral head consistent with early AVN. Lateral sit/stand show sacral inclination motion from sitting to standing of >30 degrees. Showing preserved spinal pelvic motion.  Assessment:  Left hip avascular necrosis  Plan: [name] is a 77 year old female with left hip avascular necrosis with collapse. She also has some precollapse changes in her right femoral head with no pain on exam or in daily life. We discussed potential treatments for avascular necrosis pre and post  collapse. With regards to her right hip she is not having any pain at this time and is off the steroids which we believe where the underlying cause of the injury to the bilateral femurs. We discussed there is a risk of the right hip collapsing in the future but given she is not having pain at this time we would like to hold off on any interventions on the right hip. Based upon the patient's continued symptoms and failure to respond to conservative treatments with x-ray evidence of collapse of the left femoral head, I have recommended a left total hip replacement for this patient. A long discussion took place with the patient describing what a total joint replacement is and what the procedure would entail. A hip model, similar to the implants that will be used during the operation, was utilized to demonstrate the implants. Choices of implant manufactures were discussed and reviewed. The ability to secure the implant utilizing cement or cementless (press fit) fixation was discussed. Anterior and posterior exposures were discussed. For this patient an appropriate approach will be anterior.   The hospitalization and post-operative care and rehabilitation were also discussed. The use of perioperative antibiotics and DVT prophylaxis were discussed. The risk, benefits and alternatives to a surgical intervention were discussed at length with the patient. The patient was also advised of risks related to the medical comorbidities and elevated body mass index (BMI). A lengthy discussion took place to review the most common complications including but not limited to: deep vein thrombosis, pulmonary embolus, heart attack, stroke, infection, wound breakdown, heterotopic ossification, dislocation,  numbness, leg length in-equality, intraoperative fracture, damage to nerves, tendon,muscles, arteries or other blood vessels, death and other possible complications from anesthesia. The patient was told that we will take steps to  minimize these risks by using sterile technique, antibiotics and DVT prophylaxis when appropriate and follow the patient postoperatively in the office setting to monitor progress. The possibility of recurrent pain, no improvement in pain and actual worsening of pain were also discussed with the patient. The risk of dislocation following total hip replacement was discussed and potential precautions to prevent dislocation were reviewed.   The discharge plan of care focused on the patient going home following surgery. The patient was encouraged to make the necessary arrangements to have someone stay with them when they are discharged home.   The benefits of surgery were discussed with the patient including the potential for improving the patient's current clinical condition through operative intervention. Alternatives to surgical intervention including continued conservative management were also discussed in detail. All questions were answered to the satisfaction of the patient. The patient participated and agreed to the plan of care as well as the use of the recommended implants for their total hip replacement surgery. An information packet was given to the patient to review prior to surgery.   Patient received clearance for surgery. Her anemia has improved with her iron infusion and we will check it again at the hospital. All questions answered the patient gives above plan make preparations for a left anterior total replacement.   Portions of this record have been created using Scientist, clinical (histocompatibility and immunogenetics). Dictation errors have been sought, but may not have been identified and corrected.  Reinaldo Berber MD

## 2023-01-03 NOTE — Transfer of Care (Signed)
Immediate Anesthesia Transfer of Care Note  Patient: NIMCO MCHENRY  Procedure(s) Performed: TOTAL HIP ARTHROPLASTY ANTERIOR APPROACH -RNFA (Left: Hip)  Patient Location: PACU  Anesthesia Type:Spinal  Level of Consciousness: drowsy  Airway & Oxygen Therapy: Patient Spontanous Breathing and Patient connected to face mask oxygen  Post-op Assessment: Report given to RN and Post -op Vital signs reviewed and stable  Post vital signs: Reviewed and stable  Last Vitals:  Vitals Value Taken Time  BP 95/56 01/03/23 1545  Temp 36.1 1544  Pulse 69 01/03/23 1547  Resp 14 01/03/23 1547  SpO2 100 % 01/03/23 1547  Vitals shown include unfiled device data.  Last Pain:  Vitals:   01/03/23 1127  TempSrc: Tympanic  PainSc: 0-No pain         Complications: No notable events documented.

## 2023-01-03 NOTE — Op Note (Signed)
Patient Name: Jessica Wall  ZOX:[id number]  Pre-Operative Diagnosis: Left hip Osteoarthritis  Post-Operative Diagnosis: (same)  Procedure: Left Total Hip Arthroplasty  Components/Implants: Cup: Trident Tritanium Clusterhole 43mm/D w/ x2 screws    Liner: Neutral X3 poly 36/D  Stem: Insignia #3 High offset  Head:54mm -5mm biolox ceramic  Date of Surgery: 01/03/2023  Surgeon: Reinaldo Berber MD  Assistant: [facility] RNFA (present and scrubbed throughout the case, critical for assistance with exposure, retraction, instrumentation, and closure)   Anesthesiologist: Karlton Lemon  Anesthesia: Spinal   EBL: 250cc  IVF:450cc  Complications: None   Brief history: The patient is a 77 year old female with a history of osteoarthritis of the left hip with pain limiting their range of motion and activities of daily living, which has failed multiple attempts at conservative therapy.  The risks and benefits of total hip arthroplasty as definitive surgical treatment were discussed with the patient, who opted to proceed with the operation.  After outpatient medical clearance and optimization was completed the patient was admitted to [facility] for the procedure.  All preoperative films were reviewed and an appropriate surgical plan was made prior to surgery.   Description of procedure: The patient was brought to the operating room where laterality was confirmed by all those present to be the left side.  The patient was administered spinal anesthesia on a stretcher prior to being moved supine on the operating room table. Patient was given an intravenous dose of antibiotics for surgical prophylaxis and TXA.  All bony prominences and extremities were well padded and the patient was securely attached to the table boots, a perineal post was placed and the patient had a safety strap placed.  Surgical site was prepped with alcohol and chlorhexidine. The surgical site over the hip was and draped in  typical sterile fashion with multiple layers of adhesive and nonadhesive drapes.  The incision site was marked out with a sterile marker and care was taken to assess the position of the ASIS and ensure appropriate position for the incision.    A surgical timeout was then called with participation of all staff in the room the patient was then a confirmed again and laterality confirmed.  Incision was made over the anterior lateral aspect of the proximal thigh in line with the TFL.  Appropriate retractors were placed and all bleeding vessels were coagulated within the subcutaneous and fatty layers.  An incision was made in the TFL fascia in the interval was carefully identified.  The lateral ascending branches of the circumflex vessels were identified, cauterized and carefully dissected. The main vessels were then tied with a 0 silk hand tie.  Retractors were placed around the superior lateral and inferior medial aspects of the femoral neck and a capsulotomy was performed exposing the hip joint.  Retraction stitches were placed and the capsulotomy to assist with visualization.  Femoral neck cut was then made and the femoral head was extracted after placing the leg in traction.  Bone wax was then applied to the proximal cut surface of the femur and aqua mantis was used to address any bleeding around the femoral neck cut.  Retractors were then placed around the acetabulum to fully visualize the joint space, and the remaining labral tissue was removed and pulvinar was removed.   The acetabulum was then sequentially reamed up to the appropriate size in order to get good fit and fill for the acetabular component while under fluoroscopic guidance.  Acetabular component was then placed and malleted into  a secure fit while confirming position and abduction angle and anteversion utilizing fluoroscopy.  2 screws were then placed in the acetabular cup to assist in securing the cup in place. The cup was irrigated,  a real  neutral liner was placed, impacted, and checked for stability. The femur traction was dropped and sequentially externally rotated while performing a release of the posterior and superomedial tissues off of the proximal femur to allow for mobility, care was taken to preserve the external rotators and piriformis attachments.  The remaining interval between the abductors and the capsule was dissected out and a retractor was placed over the superolateral aspect of the femur over the greater trochanter.  The leg was carefully brought down into extension and adducted to provide visualization of the proximal femur for broaching.  The femur was then sequentially broached up to an appropriate size which provided for good fill and stability to the femoral broach.  A trial neck and head were placed on the femoral broach and the leg was brought up for reduction.  The hip was reduced and manual check of stability was performed.  The hip was found to be stable in flexion internal rotation and extension external rotation.  Leg lengths were confirmed on fluoroscopy.   The hip was then dislocated the trial neck and head were removed.  The leg was then brought down into extension and adduction in the proximal femur was reexposed.  The broach trial was removed and the femur was irrigated with normal saline prior to the real femoral stem being implanted.  After the femoral stem was seated and shown to have good fit and fill the appropriate head was impacted the leg was brought up and reduced.  There was good range of motion with stability in flexion internal rotation and extension external rotation on testing.  Leg lengths were found to be appropriate on fluoroscopic evaluation at this time.  The hip was then irrigated with betdine based surgiphor solution and then saline solution.  The capsulotomy was repaired with Ethibond sutures. Surgiflo was applied to the cut end of the neck and intracapsular to help reduce any postoperative  bleeding. A pericapsular and peritrochanteric cocktail with Exparel and bupivacaine was then injected as well as the subcutaneous tissues. The fascia was closed with a #2 barbed running suture.  The deep tissues were closed with Vicryl sutures the subcutaneous tissues were closed with interrupted Vicryl sutures and a running barbed 3-0 suture.  The skin was then reinforced with Dermabond and a sterile dressing was placed.   The patient was awoken from anesthesia transferred off of the operating room table onto a hospital bed where examination of leg lengths found the leg lengths to be equal with a good distal pulse.  The patient was then transferred to the PACU in stable condition.

## 2023-01-03 NOTE — Anesthesia Preprocedure Evaluation (Signed)
Anesthesia Evaluation  Patient identified by MRN, date of birth, ID band Patient awake    Reviewed: Allergy & Precautions, NPO status , Patient's Chart, lab work & pertinent test results  History of Anesthesia Complications Negative for: history of anesthetic complications  Airway Mallampati: II  TM Distance: >3 FB Neck ROM: full    Dental  (+) Teeth Intact, Dental Advidsory Given, Missing   Pulmonary neg pulmonary ROS, Patient abstained from smoking., former smoker   Pulmonary exam normal breath sounds clear to auscultation       Cardiovascular Exercise Tolerance: Good hypertension (recently stopped BP meds), (-) angina (-) Past MI and (-) Cardiac Stents Normal cardiovascular exam(-) dysrhythmias (-) Valvular Problems/Murmurs Rhythm:Regular     Neuro/Psych  Headaches, neg Seizures  negative psych ROS   GI/Hepatic negative GI ROS, Neg liver ROS,GERD  ,,  Endo/Other  negative endocrine ROSneg diabetesHypothyroidism    Renal/GU negative Renal ROS  negative genitourinary   Musculoskeletal negative musculoskeletal ROS (+)    Abdominal Normal abdominal exam  (+)   Peds negative pediatric ROS (+)  Hematology negative hematology ROS (+) Blood dyscrasia, anemia   Anesthesia Other Findings Past Medical History: No date: GERD (gastroesophageal reflux disease) No date: Goiter     Comment:  s/p thyroidectomy No date: H/O syncope No date: Hypercholesterolemia No date: Hypothyroidism No date: Recurrent sinus infections     Comment:  and allergy problems  Past Surgical History: No date: BREAST BIOPSY 11/03/2014: COLONOSCOPY WITH PROPOFOL; N/A     Comment:  Procedure: COLONOSCOPY WITH PROPOFOL;  Surgeon: Scot Jun, MD;  Location: [facility] ENDOSCOPY;  Service:               Endoscopy;  Laterality: N/A; 02/12/2018: COLONOSCOPY WITH PROPOFOL; N/A     Comment:  Procedure: COLONOSCOPY WITH PROPOFOL;  Surgeon:  Scot Jun, MD;  Location: [facility] ENDOSCOPY;  Service:               Endoscopy;  Laterality: N/A; 1980 & 1993: DILATION AND CURETTAGE OF UTERUS 11/03/2014: ESOPHAGOGASTRODUODENOSCOPY; N/A     Comment:  Procedure: ESOPHAGOGASTRODUODENOSCOPY (EGD);  Surgeon:               Scot Jun, MD;  Location: [facility] ENDOSCOPY;                Service: Endoscopy;  Laterality: N/A; 11/26/2014: LAPAROSCOPIC APPENDECTOMY; N/A     Comment:  Procedure: APPENDECTOMY LAPAROSCOPIC;  Surgeon: Earline Mayotte, MD;  Location: [facility] ORS;  Service: General;                Laterality: N/A; 1979: thyroidectomy     Comment:  goiter 1975: TUBAL LIGATION  BMI    Body Mass Index: 22.14 kg/m      Reproductive/Obstetrics negative OB ROS                             Anesthesia Physical Anesthesia Plan  ASA: 2  Anesthesia Plan: Spinal   Post-op Pain Management:    Induction: Intravenous  PONV Risk Score and Plan: 2 and Propofol infusion and TIVA  Airway Management Planned: Nasal Cannula and Natural Airway  Additional Equipment:   Intra-op Plan:   Post-operative  Plan:   Informed Consent: I have reviewed the patients History and Physical, chart, labs and discussed the procedure including the risks, benefits and alternatives for the proposed anesthesia with the patient or authorized representative who has indicated his/her understanding and acceptance.       Plan Discussed with: CRNA and Surgeon  Anesthesia Plan Comments:         Anesthesia Quick Evaluation

## 2023-01-03 NOTE — Discharge Instructions (Signed)
Instructions after Anterior Total Hip Replacement        Dr. Regenia Skeeter., M.D.      Dept. of Orthopaedics & Sports Medicine  [facility]  [address]  [place], [place]  [address]  Phone: [phone]   Fax: [phone]    DIET: Drink plenty of non-alcoholic fluids. Resume your normal diet. Include foods high in fiber.  ACTIVITY:  You may use crutches or a walker with weight-bearing as tolerated, unless instructed otherwise. You may be weaned off of the walker or crutches by your Physical Therapist.  Continue doing gentle exercises. Exercising will reduce the pain and swelling, increase motion, and prevent muscle weakness.   Please continue to use the TED compression stockings for 2 weeks. You may remove the stockings at night, but should reapply them in the morning. Do not drive or operate any equipment until instructed.  WOUND CARE:  Continue to use ice packs periodically to reduce pain and swelling. You may shower with honeycomb dressing 3 days after your surgery. Do not submerge incision site under water. Remove honeycomb dressing 7 days after surgery and allow dermabond to fall off on its own.   MEDICATIONS: You may resume your regular medications. Please take the pain medication as prescribed on the medication list. Do not take pain medication on an empty stomach. You have been given a prescription for a blood thinner to prevent blood clots. Please take the medication as instructed. (NOTE: After completing a 2 week course of Lovenox, take one Enteric-coated 81 mg aspirin twice a day for 3 additional weeks.) Pain medications and iron supplements can cause constipation. Use a stool softener (Senokot or Colace) on a daily basis and a laxative (dulcolax or miralax) as needed. Do not drive or drink alcoholic beverages when taking pain medications.  POSTOPERATIVE CONSTIPATION PROTOCOL Constipation - defined medically as fewer than three stools per week and  severe constipation as less than one stool per week.  One of the most common issues patients have following surgery is constipation.  Even if you have a regular bowel pattern at home, your normal regimen is likely to be disrupted due to multiple reasons following surgery.  Combination of anesthesia, postoperative narcotics, change in appetite and fluid intake all can affect your bowels.  In order to avoid complications following surgery, here are some recommendations in order to help you during your recovery period.  Colace (docusate) - Pick up an over-the-counter form of Colace or another stool softener and take twice a day as long as you are requiring postoperative pain medications.  Take with a full glass of water daily.  If you experience loose stools or diarrhea, hold the colace until you stool forms back up.  If your symptoms do not get better within 1 week or if they get worse, check with your doctor.  Dulcolax (bisacodyl) - Pick up over-the-counter and take as directed by the product packaging as needed to assist with the movement of your bowels.  Take with a full glass of water.  Use this product as needed if not relieved by Colace only.   MiraLax (polyethylene glycol) - Pick up over-the-counter to have on hand.  MiraLax is a solution that will increase the amount of water in your bowels to assist with bowel movements.  Take as directed and can mix with a glass of water, juice, soda, coffee, or tea.  Take if you go more than two days without a movement. Do not use MiraLax more than  once per day. Call your doctor if you are still constipated or irregular after using this medication for 7 days in a row.  If you continue to have problems with postoperative constipation, please contact the office for further assistance and recommendations.  If you experience "the worst abdominal pain ever" or develop nausea or vomiting, please contact the office immediatly for further recommendations for  treatment.   CALL THE OFFICE FOR: Temperature above 101 degrees Excessive bleeding or drainage on the dressing. Excessive swelling, coldness, or paleness of the toes. Persistent nausea and vomiting.  FOLLOW-UP:  You should have an appointment to return to the office in 2 weeks after surgery. Arrangements have been made for continuation of Physical Therapy (either home therapy or outpatient therapy).

## 2023-01-04 ENCOUNTER — Encounter: Payer: Self-pay | Admitting: Orthopedic Surgery

## 2023-01-04 DIAGNOSIS — M1612 Unilateral primary osteoarthritis, left hip: Secondary | ICD-10-CM | POA: Diagnosis not present

## 2023-01-04 LAB — CBC
HCT: 31.8 % — ABNORMAL LOW (ref 36.0–46.0)
Hemoglobin: 10.3 g/dL — ABNORMAL LOW (ref 12.0–15.0)
MCH: 28.7 pg (ref 26.0–34.0)
MCHC: 32.4 g/dL (ref 30.0–36.0)
MCV: 88.6 fL (ref 80.0–100.0)
Platelets: 332 10*3/uL (ref 150–400)
RBC: 3.59 MIL/uL — ABNORMAL LOW (ref 3.87–5.11)
RDW: 17.4 % — ABNORMAL HIGH (ref 11.5–15.5)
WBC: 9.2 10*3/uL (ref 4.0–10.5)
nRBC: 0 % (ref 0.0–0.2)

## 2023-01-04 LAB — BASIC METABOLIC PANEL
Anion gap: 9 (ref 5–15)
BUN: 16 mg/dL (ref 8–23)
CO2: 24 mmol/L (ref 22–32)
Calcium: 8.8 mg/dL — ABNORMAL LOW (ref 8.9–10.3)
Chloride: 103 mmol/L (ref 98–111)
Creatinine, Ser: 1.05 mg/dL — ABNORMAL HIGH (ref 0.44–1.00)
GFR, Estimated: 55 mL/min — ABNORMAL LOW (ref >60)
Glucose, Bld: 150 mg/dL — ABNORMAL HIGH (ref 70–99)
Potassium: 4.9 mmol/L (ref 3.5–5.1)
Sodium: 136 mmol/L (ref 135–145)

## 2023-01-04 MED ORDER — TRAMADOL HCL 50 MG PO TABS: 50.0000 mg | ORAL_TABLET | Freq: Four times a day (QID) | ORAL | 0 refills | Status: DC | PRN

## 2023-01-04 MED ORDER — PANTOPRAZOLE SODIUM 40 MG PO TBEC
DELAYED_RELEASE_TABLET | ORAL | Status: AC
  Filled 2023-01-04: qty 1

## 2023-01-04 MED ORDER — KETOROLAC TROMETHAMINE 15 MG/ML IJ SOLN
INTRAMUSCULAR | Status: AC
  Filled 2023-01-04: qty 1

## 2023-01-04 MED ORDER — ACETAMINOPHEN 500 MG PO TABS
ORAL_TABLET | ORAL | Status: AC
  Filled 2023-01-04: qty 2

## 2023-01-04 MED ORDER — ENOXAPARIN SODIUM 40 MG/0.4ML IJ SOSY
PREFILLED_SYRINGE | INTRAMUSCULAR | Status: AC
  Filled 2023-01-04: qty 0.4

## 2023-01-04 MED ORDER — ACETAMINOPHEN 500 MG PO TABS: 1000.0000 mg | ORAL_TABLET | Freq: Four times a day (QID) | ORAL | 0 refills | Status: DC | PRN

## 2023-01-04 MED ORDER — CEFAZOLIN SODIUM-DEXTROSE 2-4 GM/100ML-% IV SOLN
INTRAVENOUS | Status: AC
  Filled 2023-01-04: qty 100

## 2023-01-04 MED ORDER — ENOXAPARIN SODIUM 40 MG/0.4ML IJ SOSY: 40.0000 mg | PREFILLED_SYRINGE | INTRAMUSCULAR | 0 refills | Status: DC

## 2023-01-04 MED ORDER — FERROUS SULFATE 325 (65 FE) MG PO TABS
ORAL_TABLET | ORAL | Status: AC
  Filled 2023-01-04: qty 1

## 2023-01-04 MED ORDER — DOCUSATE SODIUM 100 MG PO CAPS
ORAL_CAPSULE | ORAL | Status: AC
  Filled 2023-01-04: qty 1

## 2023-01-04 NOTE — Progress Notes (Signed)
The patient was discharged via wheelchair by nursing staff. The patient was discharged with written prescriptions, walker and personal belongings. Discharge instructions given to patient and partner. All questions answered.

## 2023-01-04 NOTE — Evaluation (Signed)
Physical Therapy Evaluation Patient Details Name: Jessica Wall MRN: [id number] DOB: May 19, 1945 Today's Date: 01/04/2023  History of Present Illness  Pt is a 77 y.o. 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  PMH includes htn, HA's, blood dyscrasia, anemia, goiter s/p thyroidectomy, h/o syncope, avascular necrosis, DM, giant cell arteritis.  Clinical Impression  Prior to surgery, pt was ambulatory (use of SPC as needed); lives with her husband in 1 level home with 1 STE.  Minimal to no L hip pain reported throughout session.  Pt performed LE bed level ex's and sitting ex's and issued written THA HEP handout (pt and pt's husband appearing with appropriate understanding).  Currently pt is CGA with transfers; CGA progressing to SBA ambulating 140 feet with RW use; and CGA to navigate steps with UE support.  Pt's husband present during session and safely assisting pt during session.  Pt and pt's husband educated on home safety, fall prevention, and safe car transfers: both appearing with appropriate understanding.  Pt would currently benefit from skilled PT to address noted impairments and functional limitations (see below for any additional details).  Upon hospital discharge, pt would benefit from ongoing therapy (pt planning for HHPT).  TOC and pt's nurse notified pt needing RW prior to discharge home.  Pt's nurse updated on pt's status.    If plan is discharge home, recommend the following: A little help with walking and/or transfers;A little help with bathing/dressing/bathroom;Assistance with cooking/housework;Assist for transportation;Help with stairs or ramp for entrance   Can travel by private vehicle    Yes    Equipment Recommendations Rolling walker (2 wheels)  Recommendations for Other Services       Functional Status Assessment Patient has had a recent decline in their functional status and demonstrates the ability to make significant improvements in function in a reasonable and predictable  amount of time.     Precautions / Restrictions Precautions Precautions: Anterior Hip;Fall Precaution Booklet Issued: Yes (comment) Restrictions Weight Bearing Restrictions: Yes LLE Weight Bearing: Weight bearing as tolerated      Mobility  Bed Mobility Overal bed mobility: Needs Assistance Bed Mobility: Supine to Sit, Sit to Supine     Supine to sit: Supervision Sit to supine: Min assist (assist for L LE)   General bed mobility comments: bed flat; increased time for pt to perform as much as possible on own    Transfers Overall transfer level: Needs assistance Equipment used: Rolling walker (2 wheels) Transfers: Sit to/from Stand Sit to Stand: Contact guard assist           General transfer comment: fairly strong stand from bed; steady    Ambulation/Gait Ambulation/Gait assistance: Contact guard assist, Supervision Gait Distance (Feet): 140 Feet Assistive device: Rolling walker (2 wheels) Gait Pattern/deviations: Step-through pattern Gait velocity: decreased     General Gait Details: antalgic; decreased stance time L LE; steady ambulation with RW use  Stairs Stairs: Yes Stairs assistance: Contact guard assist Stair Management: Two rails, With walker, Step to pattern, Forwards Number of Stairs: 4 General stair comments: ascended/descended 3 steps with B railings; ascended/descended 1 step with RW use; initial vc's for technique; pt's husband present assisting pt  Wheelchair Mobility     Tilt Bed    Modified Rankin (Stroke Patients Only)       Balance Overall balance assessment: Needs assistance Sitting-balance support: No upper extremity supported, Feet supported Sitting balance-Leahy Scale: Good Sitting balance - Comments: steady reaching within BOS   Standing balance support: Bilateral upper  extremity supported, During functional activity, Reliant on assistive device for balance Standing balance-Leahy Scale: Good Standing balance comment: steady  ambulating with RW use                             Pertinent Vitals/Pain Pain Assessment Pain Assessment: 0-10 Pain Score: 0-No pain (0/10 at rest beginning/end of session; 2/10 with activity) Pain Location: L hip Pain Descriptors / Indicators: Sore Pain Intervention(s): Limited activity within patient's tolerance, Monitored during session, Premedicated before session, Repositioned HR WFL during sessions activities.    Home Living Family/patient expects to be discharged to:: Private residence Living Arrangements: Spouse/significant other Available Help at Discharge: Family;Available 24 hours/day Type of Home: House Home Access: Stairs to enter Entrance Stairs-Rails: None Entrance Stairs-Number of Steps: 1   Home Layout: One level Home Equipment: Cane - single point;Toilet riser      Prior Function Prior Level of Function : Independent/Modified Independent             Mobility Comments: Use of SPC as needed for ambulation; no recent falls reported.       Extremity/Trunk Assessment   Upper Extremity Assessment Upper Extremity Assessment: Overall WFL for tasks assessed    Lower Extremity Assessment Lower Extremity Assessment: LLE deficits/detail (R LE WFL) LLE Deficits / Details: 3-/5 hip flexion; at least 3/5 AROM knee flexion/extension and DF/PF LLE: Unable to fully assess due to pain    Cervical / Trunk Assessment Cervical / Trunk Assessment: Normal  Communication   Communication Communication: No apparent difficulties Cueing Techniques: Verbal cues  Cognition Arousal: Alert Behavior During Therapy: WFL for tasks assessed/performed Overall Cognitive Status: Within Functional Limits for tasks assessed                                          General Comments General comments (skin integrity, edema, etc.): no drainage noted L hip dressing beginning/end of session.  Nursing cleared pt for participation in physical therapy.  Pt  agreeable to PT session.  Pt's husband present during session.    Exercises Total Joint Exercises Ankle Circles/Pumps: AROM, Strengthening, Both, 10 reps Quad Sets: AROM, Strengthening, Left, 10 reps, Supine Gluteal Sets: AROM, Strengthening, Both, 10 reps, Supine Towel Squeeze: AROM, Strengthening, Both, 10 reps, Supine Short Arc Quad: AROM, Strengthening, Left, 10 reps, Supine Heel Slides: AAROM, Strengthening, Left, 10 reps, Supine Hip ABduction/ADduction: AAROM, Strengthening, Left, 10 reps, Supine Long Arc Quad: AROM, Strengthening, Left, 10 reps, Seated General Exercises - Lower Extremity Hip Flexion/Marching: AROM, Strengthening, Left, 10 reps, Seated   Assessment/Plan    PT Assessment Patient needs continued PT services  PT Problem List Decreased strength;Decreased activity tolerance;Decreased balance;Decreased mobility;Decreased knowledge of use of DME;Decreased knowledge of precautions;Decreased skin integrity;Pain       PT Treatment Interventions DME instruction;Gait training;Stair training;Functional mobility training;Therapeutic activities;Therapeutic exercise;Balance training;Patient/family education    PT Goals (Current goals can be found in the Care Plan section)  Acute Rehab PT Goals Patient Stated Goal: to improve mobility and pain PT Goal Formulation: With patient Time For Goal Achievement: 01/18/23 Potential to Achieve Goals: Good    Frequency BID     Co-evaluation               AM-PAC PT "6 Clicks" Mobility  Outcome Measure Help needed turning from your back to your side while in a  flat bed without using bedrails?: A Little Help needed moving from lying on your back to sitting on the side of a flat bed without using bedrails?: A Little Help needed moving to and from a bed to a chair (including a wheelchair)?: A Little Help needed standing up from a chair using your arms (e.g., wheelchair or bedside chair)?: A Little Help needed to walk in hospital  room?: A Little Help needed climbing 3-5 steps with a railing? : A Little 6 Click Score: 18    End of Session Equipment Utilized During Treatment: Gait belt Activity Tolerance: Patient tolerated treatment well Patient left: in bed;with call bell/phone within reach;with bed alarm set;with family/visitor present Nurse Communication: Mobility status;Precautions;Weight bearing status PT Visit Diagnosis: Other abnormalities of gait and mobility (R26.89);Muscle weakness (generalized) (M62.81);Pain Pain - Right/Left: Left Pain - part of body: Hip    Time: 0925-1020 PT Time Calculation (min) (ACUTE ONLY): 55 min   Charges:   PT Evaluation $PT Eval Low Complexity: 1 Low PT Treatments $Gait Training: 8-22 mins $Therapeutic Exercise: 8-22 mins $Therapeutic Activity: 8-22 mins PT General Charges $$ ACUTE PT VISIT: 1 Visit        Hendricks Limes, PT 01/04/23, 11:31 AM

## 2023-01-04 NOTE — TOC Progression Note (Signed)
Transition of Care [facility]) - Progression Note    Patient Details  Name: Jessica Wall MRN: [id number] Date of Birth: Jul 08, 1945  Transition of Care [facility]) CM/SW Contact  Marlowe Sax, RN Phone Number: 01/04/2023, 12:30 PM  Clinical Narrative:     [facility] notified me that they are still awaiting Ins auth to be able to see the patient for [facility] services, I notified Janell Quiet the PA  on today of this as well  Expected Discharge Plan: Home w Home Health Services Barriers to Discharge: No Barriers Identified  Expected Discharge Plan and Services   Discharge Planning Services: CM Consult   Living arrangements for the past 2 months: Single Family Home Expected Discharge Date: 01/04/23               DME Arranged: Dan Humphreys rolling, 3-N-1 DME Agency: [facility]     Representative spoke with at DME Agency: Osvaldo Angst Arranged: PT HH Agency: [facility] ([facility] Date HH Agency Contacted: 01/03/23 Time HH Agency Contacted: [phone] Representative spoke with at [facility] Agency: Barbara Cower   Social Determinants of Health (SDOH) Interventions SDOH Screenings   Food Insecurity: No Food Insecurity (01/03/2023)  Housing: Low Risk  (01/03/2023)  Transportation Needs: No Transportation Needs (01/03/2023)  Utilities: Not At Risk (01/03/2023)  Depression (PHQ2-9): Low Risk  (12/26/2022)  Financial Resource Strain: Low Risk  (05/25/2022)  Physical Activity: Insufficiently Active (05/25/2022)  Social Connections: Unknown (05/25/2022)  Stress: No Stress Concern Present (05/25/2022)  Tobacco Use: Medium Risk (01/03/2023)    Readmission Risk Interventions     No data to display

## 2023-01-04 NOTE — Plan of Care (Signed)
Documented

## 2023-01-04 NOTE — Care Management Obs Status (Signed)
MEDICARE OBSERVATION STATUS NOTIFICATION   Patient Details  Name: KIKUKO TRESNER MRN: [id number] Date of Birth: 1945/12/29   Medicare Observation Status Notification Given:  Yes    Marlowe Sax, RN 01/04/2023, 9:21 AM

## 2023-01-04 NOTE — Progress Notes (Signed)
  Subjective: 1 Day Post-Op Procedure(s) (LRB): TOTAL HIP ARTHROPLASTY ANTERIOR APPROACH -RNFA (Left) Patient reports pain as mild.   Patient is well, and has had no acute complaints or problems Plan is to go Home after hospital stay. Negative for chest pain and shortness of breath Fever: no Gastrointestinal:Negative for nausea and vomiting She was able to get up and go to the bathroom this morning.  Objective: Vital signs in last 24 hours: Temp:  [97.5 F (36.4 C)-98.4 F (36.9 C)] 98 F (36.7 C) (09/20 0741) Pulse Rate:  [60-80] 65 (09/20 0741) Resp:  [11-20] 18 (09/20 0741) BP: (84-145)/(55-82) 119/74 (09/20 0741) SpO2:  [96 %-100 %] 100 % (09/20 0741)  Intake/Output from previous day:  Intake/Output Summary (Last 24 hours) at 01/04/2023 1201 Last data filed at 01/03/2023 1808 Gross per 24 hour  Intake 1583.33 ml  Output 1325 ml  Net 258.33 ml    Intake/Output this shift: No intake/output data recorded.  Labs: Recent Labs    01/04/23 0618  HGB 10.3*   Recent Labs    01/04/23 0618  WBC 9.2  RBC 3.59*  HCT 31.8*  PLT 332   Recent Labs    01/04/23 0618  NA 136  K 4.9  CL 103  CO2 24  BUN 16  CREATININE 1.05*  GLUCOSE 150*  CALCIUM 8.8*   No results for input(s): "LABPT", "INR" in the last 72 hours.   EXAM General - Patient is Alert, Appropriate, and Oriented Extremity - Neurovascular intact Dorsiflexion/Plantar flexion intact Incision: dressing C/D/I No cellulitis present Compartment soft Dressing/Incision - clean, dry, no drainage noted to the left hip honeycomb dressing. Motor Function - intact, moving foot and toes well on exam.  Abdomen soft with intact bowel sounds.  Past Medical History:  Diagnosis Date   Anemia    Angioedema due to angiotensin converting enzyme inhibitor (ACE-I)    Avascular necrosis (HCC)    Decreased GFR    DM (diabetes mellitus), type 2 (HCC)    no meds   Esophagitis    Goiter    s/p thyroidectomy   History  of colonic polyps    Hypercholesterolemia    Hypertension    no current medications   Hypothyroidism    Marijuana use    Recurrent sinus infections    and allergy problems   Syncopal episodes    last one 12-16-22   Temporal arteritis (HCC)    Tobacco use    Uterine fibroid     Assessment/Plan: 1 Day Post-Op Procedure(s) (LRB): TOTAL HIP ARTHROPLASTY ANTERIOR APPROACH -RNFA (Left) Principal Problem:   S/P total left hip arthroplasty  Estimated body mass index is 20.37 kg/m as calculated from the following:   Height as of this encounter: 5\' 3"  (1.6 m).   Weight as of this encounter: 52.2 kg. Advance diet Up with therapy D/C IV fluids when tolerating po intake.  Labs and vitals reviewed this AM.  Hg 10.3 this morning. Patient is urinating well this morning. Up with therapy today. Plan for discharge home this afternoon pending progress with PT.  DVT Prophylaxis - Lovenox and TED hose Weight-Bearing as tolerated to left leg  J. Horris Latino, PA-C [facility] 01/04/2023, 12:01 PM

## 2023-01-04 NOTE — TOC Progression Note (Signed)
Transition of Care [facility]) - Progression Note    Patient Details  Name: Jessica Wall MRN: [id number] Date of Birth: 14-May-1945  Transition of Care [facility]) CM/SW Contact  Marlowe Sax, RN Phone Number: 01/04/2023, 9:13 AM  Clinical Narrative:     [facility] is [facility] with [facility] for [facility] prior to surgery by surgeons office RW and 3 in 1 to be delivered to the bedside by [facility]  Expected Discharge Plan: Home w Home Health Services Barriers to Discharge: No Barriers Identified  Expected Discharge Plan and Services   Discharge Planning Services: CM Consult   Living arrangements for the past 2 months: Single Family Home                 DME Arranged: Walker rolling, 3-N-1 DME Agency: [facility]     Representative spoke with at DME Agency: Cletis [place] HH Arranged: PT HH Agency: [facility] Date HH Agency Contacted: 01/03/23 Time HH Agency Contacted: [phone] Representative spoke with at [facility] Agency: Barbara Cower   Social Determinants of Health (SDOH) Interventions SDOH Screenings   Food Insecurity: No Food Insecurity (01/03/2023)  Housing: Low Risk  (01/03/2023)  Transportation Needs: No Transportation Needs (01/03/2023)  Utilities: Not At Risk (01/03/2023)  Depression (PHQ2-9): Low Risk  (12/26/2022)  Financial Resource Strain: Low Risk  (05/25/2022)  Physical Activity: Insufficiently Active (05/25/2022)  Social Connections: Unknown (05/25/2022)  Stress: No Stress Concern Present (05/25/2022)  Tobacco Use: Medium Risk (01/03/2023)    Readmission Risk Interventions     No data to display

## 2023-01-04 NOTE — Anesthesia Postprocedure Evaluation (Signed)
Anesthesia Post Note  Patient: OCTOBER MATLEY  Procedure(s) Performed: TOTAL HIP ARTHROPLASTY ANTERIOR APPROACH -RNFA (Left: Hip)  Patient location during evaluation: Nursing Unit Anesthesia Type: Spinal Level of consciousness: awake and alert and oriented Pain management: pain level controlled Vital Signs Assessment: post-procedure vital signs reviewed and stable Respiratory status: respiratory function stable Cardiovascular status: stable Postop Assessment: no headache, no backache, patient able to bend at knees, no apparent nausea or vomiting, able to ambulate and adequate PO intake Anesthetic complications: no   No notable events documented.   Last Vitals:  Vitals:   01/04/23 0622 01/04/23 0741  BP: 128/78 119/74  Pulse: 80 65  Resp: 16 18  Temp: 36.9 C 36.7 C  SpO2: 100% 100%    Last Pain:  Vitals:   01/04/23 0741  TempSrc: Oral  PainSc: 0-No pain                 Zachary George

## 2023-01-04 NOTE — Progress Notes (Signed)
Patient is not able to walk the distance required to go the bathroom, or he/she is unable to safely negotiate stairs required to access the bathroom.  A 3in1 BSC will alleviate this problem   Danise Edge, PA-C [facility]

## 2023-01-06 DIAGNOSIS — E7800 Pure hypercholesterolemia, unspecified: Secondary | ICD-10-CM | POA: Diagnosis not present

## 2023-01-06 DIAGNOSIS — K219 Gastro-esophageal reflux disease without esophagitis: Secondary | ICD-10-CM | POA: Diagnosis not present

## 2023-01-06 DIAGNOSIS — M316 Other giant cell arteritis: Secondary | ICD-10-CM | POA: Diagnosis not present

## 2023-01-06 DIAGNOSIS — D509 Iron deficiency anemia, unspecified: Secondary | ICD-10-CM | POA: Diagnosis not present

## 2023-01-06 DIAGNOSIS — I10 Essential (primary) hypertension: Secondary | ICD-10-CM | POA: Diagnosis not present

## 2023-01-06 DIAGNOSIS — E039 Hypothyroidism, unspecified: Secondary | ICD-10-CM | POA: Diagnosis not present

## 2023-01-06 DIAGNOSIS — Z96642 Presence of left artificial hip joint: Secondary | ICD-10-CM | POA: Diagnosis not present

## 2023-01-06 DIAGNOSIS — Z471 Aftercare following joint replacement surgery: Secondary | ICD-10-CM | POA: Diagnosis not present

## 2023-01-06 DIAGNOSIS — E119 Type 2 diabetes mellitus without complications: Secondary | ICD-10-CM | POA: Diagnosis not present

## 2023-01-07 LAB — SURGICAL PATHOLOGY

## 2023-01-29 ENCOUNTER — Encounter: Payer: Self-pay | Admitting: Internal Medicine

## 2023-01-29 NOTE — Telephone Encounter (Signed)
Please confirm no acute symptoms - for example, nausea, vomiting, diarrhea, etc.  If no acute symptoms, would recommend nutritional supplements (ensure, boost) - to supplement her meals. Also, see if needs CCM referral for help with cost of supplements.

## 2023-02-08 NOTE — Telephone Encounter (Signed)
Needs appt to discuss and reevaluate

## 2023-02-11 MED ORDER — ENSURE ORIGINAL PO LIQD: 1.0000 | Freq: Two times a day (BID) | ORAL | 5 refills | Status: DC

## 2023-02-11 NOTE — Telephone Encounter (Signed)
Ok to send rx for ensure

## 2023-02-11 NOTE — Telephone Encounter (Signed)
Called patient to discuss. She declined an appointment at this time and would prefer to wait and see you at her next follow up because she is still seeing the surgeon following up from her surgery.. Can we send a rx to [facility] for the ensure? She says her insurance will pay for it that way so she does not have to pay out of pocket. I will send in if you are ok with this.

## 2023-02-15 DIAGNOSIS — Z96642 Presence of left artificial hip joint: Secondary | ICD-10-CM | POA: Diagnosis not present

## 2023-03-06 ENCOUNTER — Other Ambulatory Visit: Payer: Self-pay | Admitting: Internal Medicine

## 2023-04-02 ENCOUNTER — Ambulatory Visit (INDEPENDENT_AMBULATORY_CARE_PROVIDER_SITE_OTHER): Payer: No Typology Code available for payment source | Admitting: Internal Medicine

## 2023-04-02 VITALS — BP 110/68 | HR 88 | Temp 98.0°F | Resp 16 | Ht 63.0 in | Wt 119.0 lb

## 2023-04-02 DIAGNOSIS — E1165 Type 2 diabetes mellitus with hyperglycemia: Secondary | ICD-10-CM

## 2023-04-02 DIAGNOSIS — I10 Essential (primary) hypertension: Secondary | ICD-10-CM

## 2023-04-02 DIAGNOSIS — E039 Hypothyroidism, unspecified: Secondary | ICD-10-CM

## 2023-04-02 DIAGNOSIS — M316 Other giant cell arteritis: Secondary | ICD-10-CM

## 2023-04-02 DIAGNOSIS — R634 Abnormal weight loss: Secondary | ICD-10-CM

## 2023-04-02 DIAGNOSIS — E7800 Pure hypercholesterolemia, unspecified: Secondary | ICD-10-CM

## 2023-04-02 DIAGNOSIS — Z860100 Personal history of colon polyps, unspecified: Secondary | ICD-10-CM | POA: Diagnosis not present

## 2023-04-02 DIAGNOSIS — F439 Reaction to severe stress, unspecified: Secondary | ICD-10-CM

## 2023-04-02 DIAGNOSIS — D509 Iron deficiency anemia, unspecified: Secondary | ICD-10-CM

## 2023-04-02 DIAGNOSIS — R739 Hyperglycemia, unspecified: Secondary | ICD-10-CM

## 2023-04-02 LAB — HM DIABETES FOOT EXAM

## 2023-04-02 NOTE — Progress Notes (Signed)
Subjective:    [name], female    DOB: May 04, 1945, 77 y.o.   MRN: [id number]  Patient here for  Chief Complaint  Patient presents with   Medical Management of Chronic Issues    HPI Here for a scheduled follow up - f/u regarding hypertension, recent hip surgery and weight loss. Had f/u with ortho 02/15/23 - stable/doing well. Walking - no pain. Staying active. Eating better. Weight is up. No chest pain or sob reported. No abdominal pain or bowel change reported. Overall feels good.    Past Medical History:  Diagnosis Date   Anemia    Angioedema due to angiotensin converting enzyme inhibitor (ACE-I)    Avascular necrosis (HCC)    Decreased GFR    DM (diabetes mellitus), type 2 (HCC)    no meds   Esophagitis    Goiter    s/p thyroidectomy   History of colonic polyps    Hypercholesterolemia    Hypertension    no current medications   Hypothyroidism    Marijuana use    Recurrent sinus infections    and allergy problems   Syncopal episodes    last one 12-16-22   Temporal arteritis (HCC)    Tobacco use    Uterine fibroid    Past Surgical History:  Procedure Laterality Date   ARTERY BIOPSY Right 03/13/2021   Procedure: BIOPSY TEMPORAL ARTERY;  Surgeon: Earline Mayotte, MD;  Location: [facility] ORS;  Service: General;  Laterality: Right;   BREAST BIOPSY     COLONOSCOPY WITH PROPOFOL N/A 11/03/2014   Procedure: COLONOSCOPY WITH PROPOFOL;  Surgeon: Scot Jun, MD;  Location: [facility] ENDOSCOPY;  Service: Endoscopy;  Laterality: N/A;   COLONOSCOPY WITH PROPOFOL N/A 02/12/2018   Procedure: COLONOSCOPY WITH PROPOFOL;  Surgeon: Scot Jun, MD;  Location: [facility] ENDOSCOPY;  Service: Endoscopy;  Laterality: N/A;   DILATION AND CURETTAGE OF UTERUS  1980 & 1993   ESOPHAGOGASTRODUODENOSCOPY N/A 11/03/2014   Procedure: ESOPHAGOGASTRODUODENOSCOPY (EGD);  Surgeon: Scot Jun, MD;  Location: [facility] ENDOSCOPY;  Service: Endoscopy;  Laterality: N/A;   LAPAROSCOPIC  APPENDECTOMY N/A 11/26/2014   Procedure: APPENDECTOMY LAPAROSCOPIC;  Surgeon: Earline Mayotte, MD;  Location: [facility] ORS;  Service: General;  Laterality: N/A;   thyroidectomy  1979   goiter   TOTAL HIP ARTHROPLASTY Left 01/03/2023   Procedure: TOTAL HIP ARTHROPLASTY ANTERIOR APPROACH -RNFA;  Surgeon: Reinaldo Berber, MD;  Location: [facility] ORS;  Service: Orthopedics;  Laterality: Left;   TUBAL LIGATION  1975   Family History  Problem Relation Age of Onset   Tuberculosis Paternal Grandfather    Alzheimer's disease Mother    Goiter Mother    Cancer Brother    Cancer Brother    Cancer Brother    Cancer Brother    Social History   Socioeconomic History   Marital status: Married    Spouse name: Not on file   Number of children: Not on file   Years of education: Not on file   Highest education level: Not on file  Occupational History   Not on file  Tobacco Use   Smoking status: Former    Current packs/day: 0.00    Types: Cigarettes    Quit date: 04/16/2009    Years since quitting: 13.9   Smokeless tobacco: Never  Vaping Use   Vaping status: Never Used  Substance and Sexual Activity   Alcohol use: Yes    Alcohol/week: 3.0 - 4.0 standard drinks of alcohol  Types: 3 - 4 Standard drinks or equivalent per week    Comment: rare   Drug use: Yes    Types: Marijuana    Comment: marijuana every day   Sexual activity: Not on file  Other Topics Concern   Not on file  Social History Narrative   Not on file   Social Drivers of Health   Financial Resource Strain: Low Risk  (05/25/2022)   Overall Financial Resource Strain (CARDIA)    Difficulty of Paying Living Expenses: Not hard at all  Food Insecurity: No Food Insecurity (01/03/2023)   Hunger Vital Sign    Worried About Running Out of Food in the Last Year: Never true    Ran Out of Food in the Last Year: Never true  Transportation Needs: No Transportation Needs (01/03/2023)   PRAPARE - Administrator, Civil Service  (Medical): No    Lack of Transportation (Non-Medical): No  Physical Activity: Insufficiently Active (05/25/2022)   Exercise Vital Sign    Days of Exercise per Week: 3 days    Minutes of Exercise per Session: 30 min  Stress: No Stress Concern Present (05/25/2022)   [employer] of Occupational Health - Occupational Stress Questionnaire    Feeling of Stress : Not at all  Social Connections: Unknown (05/25/2022)   Social Connection and Isolation Panel [NHANES]    Frequency of Communication with Friends and Family: More than three times a week    Frequency of Social Gatherings with Friends and Family: More than three times a week    Attends Religious Services: Not on Marketing executive or Organizations: Not on file    Attends Banker Meetings: Not on file    Marital Status: Not on file     Review of Systems  Constitutional:  Negative for appetite change and unexpected weight change.  HENT:  Negative for congestion and sinus pressure.   Respiratory:  Negative for cough, chest tightness and shortness of breath.   Cardiovascular:  Negative for chest pain and palpitations.  Gastrointestinal:  Negative for abdominal pain, diarrhea, nausea and vomiting.  Genitourinary:  Negative for difficulty urinating and dysuria.  Musculoskeletal:  Negative for joint swelling and myalgias.  Skin:  Negative for color change and rash.  Neurological:  Negative for dizziness and headaches.  Psychiatric/Behavioral:  Negative for agitation and dysphoric mood.        Objective:     BP 110/68   Pulse 88   Temp 98 F (36.7 C)   Resp 16   Ht 5\' 3"  (1.6 m)   Wt 119 lb (54 kg)   SpO2 98%   BMI 21.08 kg/m  Wt Readings from Last 3 Encounters:  04/02/23 119 lb (54 kg)  01/03/23 115 lb (52.2 kg)  01/01/23 110 lb (49.9 kg)    Physical Exam Vitals reviewed.  Constitutional:      General: She is not in acute distress.    Appearance: Normal appearance.  HENT:     Head:  Normocephalic and atraumatic.     Right Ear: External ear normal.     Left Ear: External ear normal.     Mouth/Throat:     Pharynx: No oropharyngeal exudate or posterior oropharyngeal erythema.  Eyes:     General: No scleral icterus.       Right eye: No discharge.        Left eye: No discharge.     Conjunctiva/sclera: Conjunctivae normal.  Neck:  Thyroid: No thyromegaly.  Cardiovascular:     Rate and Rhythm: Normal rate and regular rhythm.  Pulmonary:     Effort: No respiratory distress.     Breath sounds: Normal breath sounds. No wheezing.  Abdominal:     General: Bowel sounds are normal.     Palpations: Abdomen is soft.     Tenderness: There is no abdominal tenderness.  Musculoskeletal:        General: No swelling or tenderness.     Cervical back: Neck supple. No tenderness.  Lymphadenopathy:     Cervical: No cervical adenopathy.  Skin:    Findings: No erythema or rash.  Neurological:     Mental Status: She is alert.  Psychiatric:        Mood and Affect: Mood normal.        Behavior: Behavior normal.      Outpatient Encounter Medications as of 04/02/2023  Medication Sig   levothyroxine (SYNTHROID) 50 MCG tablet TAKE 1 TABLET BY MOUTH EVERY DAY   [DISCONTINUED] acetaminophen (TYLENOL) 500 MG tablet Take 2 tablets (1,000 mg total) by mouth every 6 (six) hours as needed.   [DISCONTINUED] cyanocobalamin 1000 MCG tablet Take 1,000 mcg by mouth daily.   [DISCONTINUED] enoxaparin (LOVENOX) 40 MG/0.4ML injection Inject 0.4 mLs (40 mg total) into the skin daily.   [DISCONTINUED] ferrous sulfate 325 (65 FE) MG tablet Take 325 mg by mouth daily with breakfast.   [DISCONTINUED] Nutritional Supplements (ENSURE ORIGINAL) LIQD Take 1 Bottle by mouth 2 (two) times daily.   [DISCONTINUED] rosuvastatin (CRESTOR) 10 MG tablet TAKE 1 TABLET BY MOUTH EVERY DAY (Patient taking differently: Take 10 mg by mouth every evening.)   [DISCONTINUED] traMADol (ULTRAM) 50 MG tablet Take 1 tablet  (50 mg total) by mouth every 6 (six) hours as needed for moderate pain.   No facility-administered encounter medications on file as of 04/02/2023.     Lab Results  Component Value Date   WBC 6.7 04/02/2023   HGB 10.6 (L) 04/02/2023   HCT 31.9 (L) 04/02/2023   PLT 370.0 04/02/2023   GLUCOSE 84 04/02/2023   CHOL 144 12/19/2022   TRIG 73.0 12/19/2022   HDL 69.20 12/19/2022   LDLCALC 60 12/19/2022   ALT 7 04/02/2023   AST 15 04/02/2023   NA 137 04/02/2023   K 4.2 04/02/2023   CL 102 04/02/2023   CREATININE 0.93 04/02/2023   BUN 14 04/02/2023   CO2 27 04/02/2023   TSH 0.57 04/02/2023   INR 1.2 06/07/2021   HGBA1C 6.2 04/02/2023   MICROALBUR <0.7 04/02/2023    DG HIP UNILAT WITH PELVIS 1V LEFT Result Date: 01/03/2023 CLINICAL DATA:  Elective surgery. EXAM: DG HIP (WITH OR WITHOUT PELVIS) 1V*L* COMPARISON:  None Available. FINDINGS: Three fluoroscopic spot views of the pelvis and left hip obtained in the operating room. Images during hip arthroplasty. Fluoroscopy time 14 seconds. Dose 1.094 mGy. IMPRESSION: Intraoperative fluoroscopy during left hip arthroplasty. Electronically Signed   By: Narda Rutherford M.D.   On: 01/03/2023 16:08   DG C-Arm 1-60 Min-No Report Result Date: 01/03/2023 Fluoroscopy was utilized by the requesting physician.  No radiographic interpretation.   DG C-Arm 1-60 Min-No Report Result Date: 01/03/2023 Fluoroscopy was utilized by the requesting physician.  No radiographic interpretation.       Assessment & Plan:  Type 2 diabetes mellitus with hyperglycemia, without long-term current use of insulin (HCC) Assessment & Plan: Follow met b and A1c.  Off prednisone.   Orders: -  Basic metabolic panel -     Hemoglobin A1c -     Microalbumin / creatinine urine ratio  Hypothyroidism, unspecified type Assessment & Plan: On synthroid.  Follow tsh.   Orders: -     TSH  Iron deficiency anemia, unspecified iron deficiency anemia type Assessment &  Plan: Saw hematology 10/31/22.  Receiving IV iron infusions. Saw GI.  CT - no acute intraabdominal abnormality.  Deferred colonoscopy and EGD. Follow cbc and iron studies.   Orders: -     IBC + Ferritin  Hypercholesterolemia Assessment & Plan: Continue crestor.  Follow lipid panel and liver function tests.   Lab Results  Component Value Date   CHOL 144 12/19/2022   HDL 69.20 12/19/2022   LDLCALC 60 12/19/2022   TRIG 73.0 12/19/2022   CHOLHDL 2 12/19/2022    Orders: -     CBC with Differential/Platelet -     Hepatic function panel  Essential hypertension, benign Assessment & Plan: Remain off blood pressure medication.  Pressure lower in left arm compared to right arm. Discussed further w/up. Wants to hold at this time.  Follow.    Weight loss Assessment & Plan: Appetite has improved. Weight is up.  Feels better. Follow.    Temporal arteritis (HCC) Assessment & Plan: Followed by rheumatology.   Off prednisone.  Doing well off.  Follow.    Stress Assessment & Plan: Overall doing better.  Feels better.  Follow.    Hyperglycemia Assessment & Plan: Follow met b and a1c.    History of colonic polyps Assessment & Plan: Colonoscopy 09/2020 - recommended no f/u colonoscopy.        Dale Hunker, MD

## 2023-04-03 ENCOUNTER — Inpatient Hospital Stay: Payer: No Typology Code available for payment source | Attending: Internal Medicine

## 2023-04-03 LAB — CBC WITH DIFFERENTIAL/PLATELET
Basophils Absolute: 0.1 10*3/uL (ref 0.0–0.1)
Basophils Relative: 1.3 % (ref 0.0–3.0)
Eosinophils Absolute: 0.1 10*3/uL (ref 0.0–0.7)
Eosinophils Relative: 1.1 % (ref 0.0–5.0)
HCT: 31.9 % — ABNORMAL LOW (ref 36.0–46.0)
Hemoglobin: 10.6 g/dL — ABNORMAL LOW (ref 12.0–15.0)
Lymphocytes Relative: 40 % (ref 12.0–46.0)
Lymphs Abs: 2.7 10*3/uL (ref 0.7–4.0)
MCHC: 33 g/dL (ref 30.0–36.0)
MCV: 92.7 fL (ref 78.0–100.0)
Monocytes Absolute: 0.4 10*3/uL (ref 0.1–1.0)
Monocytes Relative: 6.6 % (ref 3.0–12.0)
Neutro Abs: 3.4 10*3/uL (ref 1.4–7.7)
Neutrophils Relative %: 51 % (ref 43.0–77.0)
Platelets: 370 10*3/uL (ref 150.0–400.0)
RBC: 3.44 Mil/uL — ABNORMAL LOW (ref 3.87–5.11)
RDW: 16.1 % — ABNORMAL HIGH (ref 11.5–15.5)
WBC: 6.7 10*3/uL (ref 4.0–10.5)

## 2023-04-03 LAB — HEPATIC FUNCTION PANEL
ALT: 7 U/L (ref 0–35)
AST: 15 U/L (ref 0–37)
Albumin: 3.7 g/dL (ref 3.5–5.2)
Alkaline Phosphatase: 95 U/L (ref 39–117)
Bilirubin, Direct: 0 mg/dL (ref 0.0–0.3)
Total Bilirubin: 0.3 mg/dL (ref 0.2–1.2)
Total Protein: 7.4 g/dL (ref 6.0–8.3)

## 2023-04-03 LAB — BASIC METABOLIC PANEL
BUN: 14 mg/dL (ref 6–23)
CO2: 27 meq/L (ref 19–32)
Calcium: 8.8 mg/dL (ref 8.4–10.5)
Chloride: 102 meq/L (ref 96–112)
Creatinine, Ser: 0.93 mg/dL (ref 0.40–1.20)
GFR: 60.02 mL/min (ref >60.00)
Glucose, Bld: 84 mg/dL (ref 70–99)
Potassium: 4.2 meq/L (ref 3.5–5.1)
Sodium: 137 meq/L (ref 135–145)

## 2023-04-03 LAB — IBC + FERRITIN
Ferritin: 422.6 ng/mL — ABNORMAL HIGH (ref 10.0–291.0)
Iron: 22 ug/dL — ABNORMAL LOW (ref 42–145)
Saturation Ratios: 10.3 % — ABNORMAL LOW (ref 20.0–50.0)
TIBC: 212.8 ug/dL — ABNORMAL LOW (ref 250.0–450.0)
Transferrin: 152 mg/dL — ABNORMAL LOW (ref 212.0–360.0)

## 2023-04-03 LAB — HEMOGLOBIN A1C: Hgb A1c MFr Bld: 6.2 % (ref 4.6–6.5)

## 2023-04-03 LAB — MICROALBUMIN / CREATININE URINE RATIO
Creatinine,U: 28.4 mg/dL
Microalb Creat Ratio: 2.5 mg/g (ref 0.0–30.0)
Microalb, Ur: <0.7 mg/dL (ref 0.0–1.9)

## 2023-04-03 LAB — TSH: TSH: 0.57 u[IU]/mL (ref 0.35–5.50)

## 2023-04-05 DIAGNOSIS — R2681 Unsteadiness on feet: Secondary | ICD-10-CM | POA: Diagnosis not present

## 2023-04-05 DIAGNOSIS — R7303 Prediabetes: Secondary | ICD-10-CM | POA: Diagnosis not present

## 2023-04-05 DIAGNOSIS — M316 Other giant cell arteritis: Secondary | ICD-10-CM | POA: Diagnosis not present

## 2023-04-05 DIAGNOSIS — N1831 Chronic kidney disease, stage 3a: Secondary | ICD-10-CM | POA: Diagnosis not present

## 2023-04-05 DIAGNOSIS — E039 Hypothyroidism, unspecified: Secondary | ICD-10-CM | POA: Diagnosis not present

## 2023-04-05 DIAGNOSIS — I700 Atherosclerosis of aorta: Secondary | ICD-10-CM | POA: Diagnosis not present

## 2023-04-05 DIAGNOSIS — Z6821 Body mass index (BMI) 21.0-21.9, adult: Secondary | ICD-10-CM | POA: Diagnosis not present

## 2023-04-05 DIAGNOSIS — Z008 Encounter for other general examination: Secondary | ICD-10-CM | POA: Diagnosis not present

## 2023-04-06 ENCOUNTER — Encounter: Payer: Self-pay | Admitting: Internal Medicine

## 2023-04-06 NOTE — Assessment & Plan Note (Signed)
Overall doing better.  Feels better.  Follow.

## 2023-04-06 NOTE — Assessment & Plan Note (Signed)
Follow met b and a1c.  

## 2023-04-06 NOTE — Assessment & Plan Note (Signed)
Followed by rheumatology.   Off prednisone.  Doing well off.  Follow.

## 2023-04-06 NOTE — Assessment & Plan Note (Signed)
Follow met b and A1c.  Off prednisone.

## 2023-04-06 NOTE — Assessment & Plan Note (Signed)
Remain off blood pressure medication.  Pressure lower in left arm compared to right arm. Discussed further w/up. Wants to hold at this time.  Follow.

## 2023-04-06 NOTE — Assessment & Plan Note (Signed)
Appetite has improved. Weight is up.  Feels better. Follow.

## 2023-04-06 NOTE — Assessment & Plan Note (Signed)
Colonoscopy 09/2020 - recommended no f/u colonoscopy.   

## 2023-04-06 NOTE — Assessment & Plan Note (Signed)
On synthroid.  Follow tsh.   

## 2023-04-06 NOTE — Assessment & Plan Note (Signed)
Continue crestor.  Follow lipid panel and liver function tests.   Lab Results  Component Value Date   CHOL 144 12/19/2022   HDL 69.20 12/19/2022   LDLCALC 60 12/19/2022   TRIG 73.0 12/19/2022   CHOLHDL 2 12/19/2022

## 2023-04-06 NOTE — Assessment & Plan Note (Signed)
Saw hematology 10/31/22.  Receiving IV iron infusions. Saw GI.  CT - no acute intraabdominal abnormality.  Deferred colonoscopy and EGD. Follow cbc and iron studies.

## 2023-04-24 DIAGNOSIS — Z96642 Presence of left artificial hip joint: Secondary | ICD-10-CM | POA: Diagnosis not present

## 2023-05-13 ENCOUNTER — Telehealth: Payer: Self-pay | Admitting: Internal Medicine

## 2023-05-13 NOTE — Telephone Encounter (Signed)
Copied from CRM [id number]. Topic: Medicare AWV >> May 13, 2023  2:09 PM Payton Doughty wrote: Reason for CRM: LVM 05/13/2023 to r/s AWV- Please call [phone] to r/s AWV appt.  Verlee Rossetti; Care Guide Ambulatory Clinical Support [place] [facility] Direct Dial: [phone]

## 2023-06-11 ENCOUNTER — Telehealth: Payer: Self-pay | Admitting: Internal Medicine

## 2023-06-11 NOTE — Telephone Encounter (Signed)
 Copied from CRM [id number]. Topic: Medicare AWV >> Jun 11, 2023 12:50 PM Payton Doughty wrote: Reason for CRM: Called LVM 06/11/2023 to schedule AWV. Please schedule office or virtual visits.  Verlee Rossetti; Care Guide Ambulatory Clinical Support [place] [facility] Direct Dial: [phone]

## 2023-07-02 ENCOUNTER — Other Ambulatory Visit: Payer: Self-pay

## 2023-07-03 ENCOUNTER — Inpatient Hospital Stay: Payer: No Typology Code available for payment source | Attending: Internal Medicine

## 2023-07-03 ENCOUNTER — Inpatient Hospital Stay (HOSPITAL_BASED_OUTPATIENT_CLINIC_OR_DEPARTMENT_OTHER): Payer: No Typology Code available for payment source | Admitting: Oncology

## 2023-07-03 ENCOUNTER — Encounter: Payer: Self-pay | Admitting: Oncology

## 2023-07-03 DIAGNOSIS — D508 Other iron deficiency anemias: Secondary | ICD-10-CM

## 2023-07-03 DIAGNOSIS — Z860100 Personal history of colon polyps, unspecified: Secondary | ICD-10-CM | POA: Diagnosis not present

## 2023-07-03 DIAGNOSIS — D509 Iron deficiency anemia, unspecified: Secondary | ICD-10-CM | POA: Diagnosis not present

## 2023-07-03 LAB — CBC
HCT: 33.5 % — ABNORMAL LOW (ref 36.0–46.0)
Hemoglobin: 10.9 g/dL — ABNORMAL LOW (ref 12.0–15.0)
MCH: 30.6 pg (ref 26.0–34.0)
MCHC: 32.5 g/dL (ref 30.0–36.0)
MCV: 94.1 fL (ref 80.0–100.0)
Platelets: 306 10*3/uL (ref 150–400)
RBC: 3.56 MIL/uL — ABNORMAL LOW (ref 3.87–5.11)
RDW: 16.2 % — ABNORMAL HIGH (ref 11.5–15.5)
WBC: 5.5 10*3/uL (ref 4.0–10.5)
nRBC: 0 % (ref 0.0–0.2)

## 2023-07-03 LAB — FERRITIN: Ferritin: 340 ng/mL — ABNORMAL HIGH (ref 11–307)

## 2023-07-03 LAB — IRON AND TIBC
Iron: 37 ug/dL (ref 28–170)
Saturation Ratios: 17 % (ref 10.4–31.8)
TIBC: 213 ug/dL — ABNORMAL LOW (ref 250–450)
UIBC: 176 ug/dL

## 2023-07-03 NOTE — Progress Notes (Signed)
 Hematology/Oncology Consult note [facility]  [phone] [phone]  Patient Care Team: Dale Ridgecrest, MD as PCP - General (Internal Medicine) Dale Fulton, MD (Internal Medicine) Lemar Livings, Merrily Pew, MD (General Surgery) Creig Hines, MD as Consulting Physician (Oncology)   Name of the patient: Jessica Wall  [id number]  02-Mar-1946   Date of visit: 07/03/23  Diagnosis- normocytic anemia likely combination of iron deficiency and anemia of chronic disease    Chief complaint/ Reason for visit-routine follow-up of anemia  Heme/Onc history: Patient is a 78 year old female past medical history is significant for hypothyroidism hypertension and hyperlipidemia. 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 She is scheduled to undergo left hip replacement and is here for further evaluation of anemia. Most recent CBC from 10/17/2022 showed an H&H of 10.1/30.7 which is gradually drifted down over the last 2 years. Prior to that her hemoglobin was around 12. Most recent iron studies showed a low iron saturation of 6.4% and low TIBC and normal ferritin of 237.    Results of anemia workup from 10/31/2022 were as follows: H&H 8.8/27.9 ferritin level 201 iron saturation 12% folate and B12 are normal.  Myeloma panel showed no M protein.  Haptoglobin elevated at 554.  Reticulocyte count low at 1.6.  Serum free light chain ratio normal.  Interval history-patient feels well overall and denies any complaints at this time.  Appetite has been good and she denies any bleeding in her stool or urine.  ECOG PS- 0 Pain scale- 0   Review of systems- Review of Systems  Constitutional:  Negative for chills, fever, malaise/fatigue and weight loss.  HENT:  Negative for congestion, ear discharge and nosebleeds.   Eyes:  Negative for blurred vision.  Respiratory:  Negative for  cough, hemoptysis, sputum production, shortness of breath and wheezing.   Cardiovascular:  Negative for chest pain, palpitations, orthopnea and claudication.  Gastrointestinal:  Negative for abdominal pain, blood in stool, constipation, diarrhea, heartburn, melena, nausea and vomiting.  Genitourinary:  Negative for dysuria, flank pain, frequency, hematuria and urgency.  Musculoskeletal:  Negative for back pain, joint pain and myalgias.  Skin:  Negative for rash.  Neurological:  Negative for dizziness, tingling, focal weakness, seizures, weakness and headaches.  Endo/Heme/Allergies:  Does not bruise/bleed easily.  Psychiatric/Behavioral:  Negative for depression and suicidal ideas. The patient does not have insomnia.       Allergies  Allergen Reactions   Ace Inhibitors Swelling   Aspirin Swelling     Past Medical History:  Diagnosis Date   Anemia    Angioedema due to angiotensin converting enzyme inhibitor (ACE-I)    Avascular necrosis (HCC)    Decreased GFR    DM (diabetes mellitus), type 2 (HCC)    no meds   Esophagitis    Goiter    s/p thyroidectomy   History of colonic polyps    Hypercholesterolemia    Hypertension    no current medications   Hypothyroidism    Marijuana use    Recurrent sinus infections    and allergy problems   Syncopal episodes    last one 12-16-22   Temporal arteritis (HCC)    Tobacco use    Uterine fibroid      Past Surgical History:  Procedure Laterality Date   ARTERY BIOPSY Right 03/13/2021   Procedure: BIOPSY TEMPORAL ARTERY;  Surgeon: Earline Mayotte, MD;  Location: [facility] ORS;  Service: General;  Laterality: Right;   BREAST BIOPSY     COLONOSCOPY WITH PROPOFOL N/A 11/03/2014   Procedure: COLONOSCOPY WITH PROPOFOL;  Surgeon: Scot Jun, MD;  Location: [facility] ENDOSCOPY;  Service: Endoscopy;  Laterality: N/A;   COLONOSCOPY WITH PROPOFOL N/A 02/12/2018   Procedure: COLONOSCOPY WITH PROPOFOL;  Surgeon: Scot Jun, MD;  Location:  [facility] ENDOSCOPY;  Service: Endoscopy;  Laterality: N/A;   DILATION AND CURETTAGE OF UTERUS  1980 & 1993   ESOPHAGOGASTRODUODENOSCOPY N/A 11/03/2014   Procedure: ESOPHAGOGASTRODUODENOSCOPY (EGD);  Surgeon: Scot Jun, MD;  Location: [facility] ENDOSCOPY;  Service: Endoscopy;  Laterality: N/A;   LAPAROSCOPIC APPENDECTOMY N/A 11/26/2014   Procedure: APPENDECTOMY LAPAROSCOPIC;  Surgeon: Earline Mayotte, MD;  Location: [facility] ORS;  Service: General;  Laterality: N/A;   thyroidectomy  1979   goiter   TOTAL HIP ARTHROPLASTY Left 01/03/2023   Procedure: TOTAL HIP ARTHROPLASTY ANTERIOR APPROACH -RNFA;  Surgeon: Reinaldo Berber, MD;  Location: [facility] ORS;  Service: Orthopedics;  Laterality: Left;   TUBAL LIGATION  1975    Social History   Socioeconomic History   Marital status: Married    Spouse name: Not on file   Number of children: Not on file   Years of education: Not on file   Highest education level: Not on file  Occupational History   Not on file  Tobacco Use   Smoking status: Former    Current packs/day: 0.00    Types: Cigarettes    Quit date: 04/16/2009    Years since quitting: 14.2   Smokeless tobacco: Never  Vaping Use   Vaping status: Never Used  Substance and Sexual Activity   Alcohol use: Yes    Alcohol/week: 3.0 - 4.0 standard drinks of alcohol    Types: 3 - 4 Standard drinks or equivalent per week    Comment: rare   Drug use: Yes    Types: Marijuana    Comment: marijuana every day   Sexual activity: Not on file  Other Topics Concern   Not on file  Social History Narrative   Not on file   Social Drivers of Health   Financial Resource Strain: Low Risk  (05/25/2022)   Overall Financial Resource Strain (CARDIA)    Difficulty of Paying Living Expenses: Not hard at all  Food Insecurity: No Food Insecurity (01/03/2023)   Hunger Vital Sign    Worried About Running Out of Food in the Last Year: Never true    Ran Out of Food in the Last Year: Never true  Transportation Needs:  No Transportation Needs (01/03/2023)   PRAPARE - Administrator, Civil Service (Medical): No    Lack of Transportation (Non-Medical): No  Physical Activity: Insufficiently Active (05/25/2022)   Exercise Vital Sign    Days of Exercise per Week: 3 days    Minutes of Exercise per Session: 30 min  Stress: No Stress Concern Present (05/25/2022)   [employer] of Occupational Health - Occupational Stress Questionnaire    Feeling of Stress : Not at all  Social Connections: Unknown (05/25/2022)   Social Connection and Isolation Panel [NHANES]    Frequency of Communication with Friends and Family: More than three times a week    Frequency of Social Gatherings with Friends and Family: More than three times a week    Attends Religious Services: Not on file    Active Member of Clubs or Organizations: Not on file    Attends Banker  Meetings: Not on file    Marital Status: Not on file  Intimate Partner Violence: Not At Risk (01/03/2023)   Humiliation, Afraid, Rape, and Kick questionnaire    Fear of Current or Ex-Partner: No    Emotionally Abused: No    Physically Abused: No    Sexually Abused: No    Family History  Problem Relation Age of Onset   Tuberculosis Paternal Grandfather    Alzheimer's disease Mother    Goiter Mother    Cancer Brother    Cancer Brother    Cancer Brother    Cancer Brother      Current Outpatient Medications:    ferrous sulfate 325 (65 FE) MG tablet, Take 50 mg by mouth daily with breakfast., Disp: , Rfl:    levothyroxine (SYNTHROID) 50 MCG tablet, TAKE 1 TABLET BY MOUTH EVERY DAY, Disp: 90 tablet, Rfl: 1  Physical exam:  Vitals:   07/03/23 1014  BP: 110/66  Pulse: 67  Resp: 18  Temp: (!) 97.3 F (36.3 C)  TempSrc: Tympanic  SpO2: 100%  Weight: 115 lb (52.2 kg)  Height: 5\' 3"  (1.6 m)   Physical Exam Cardiovascular:     Rate and Rhythm: Normal rate and regular rhythm.     Heart sounds: Normal heart sounds.  Pulmonary:      Effort: Pulmonary effort is normal.     Breath sounds: Normal breath sounds.  Skin:    General: Skin is warm and dry.  Neurological:     Mental Status: She is alert and oriented to person, place, and time.         Latest Ref Rng & Units 04/02/2023    3:53 PM  CMP  Glucose 70 - 99 mg/dL 84   BUN 6 - 23 mg/dL 14   Creatinine 2.95 - 1.20 mg/dL 2.84   Sodium 132 - 440 mEq/L 137   Potassium 3.5 - 5.1 mEq/L 4.2   Chloride 96 - 112 mEq/L 102   CO2 19 - 32 mEq/L 27   Calcium 8.4 - 10.5 mg/dL 8.8   Total Protein 6.0 - 8.3 g/dL 7.4   Total Bilirubin 0.2 - 1.2 mg/dL 0.3   Alkaline Phos 39 - 117 U/L 95   AST 0 - 37 U/L 15   ALT 0 - 35 U/L 7       Latest Ref Rng & Units 07/03/2023    9:50 AM  CBC  WBC 4.0 - 10.5 K/uL 5.5   Hemoglobin 12.0 - 15.0 g/dL 10.2   Hematocrit 72.5 - 46.0 % 33.5   Platelets 150 - 400 K/uL 306     No images are attached to the encounter.  No results found.   Assessment and plan- Patient is a 78 y.o. female here for routine follow-up of normocytic anemia with a component of iron deficiency anemia  After receiving IV iron in the past patient's hemoglobin improved from 8.8 and is presently at 10.9.  Baseline hemoglobin runs around 11.5.  Ferritin levels have been mildly elevated at 340 without a clear rising trend.  Iron studies are indicative of anemia of chronic disease.  She does not have any evidence of chronic kidney disease.  Plan is to monitor her anemia conservatively and as long as it remains between 10-11 there would be no need for any further investigations.  However if there is a continued decrease in her hemoglobin we could consider getting bone marrow biopsy.  CBC with differential in 4 months in 8 months and  I will see her back in 8 months along with iron studies.   Visit Diagnosis 1. Other iron deficiency anemia      Dr. Owens Shark, MD, MPH [facility] [phone] 07/03/2023 1:21 PM

## 2023-08-21 ENCOUNTER — Ambulatory Visit (INDEPENDENT_AMBULATORY_CARE_PROVIDER_SITE_OTHER): Payer: No Typology Code available for payment source | Admitting: Internal Medicine

## 2023-08-21 ENCOUNTER — Other Ambulatory Visit: Payer: Self-pay | Admitting: Internal Medicine

## 2023-08-21 ENCOUNTER — Encounter: Payer: Self-pay | Admitting: Internal Medicine

## 2023-08-21 VITALS — BP 112/68 | HR 81 | Temp 98.0°F | Resp 16 | Ht 63.0 in | Wt 113.0 lb

## 2023-08-21 DIAGNOSIS — E039 Hypothyroidism, unspecified: Secondary | ICD-10-CM

## 2023-08-21 DIAGNOSIS — I10 Essential (primary) hypertension: Secondary | ICD-10-CM

## 2023-08-21 DIAGNOSIS — R9089 Other abnormal findings on diagnostic imaging of central nervous system: Secondary | ICD-10-CM

## 2023-08-21 DIAGNOSIS — Z0000 Encounter for general adult medical examination without abnormal findings: Secondary | ICD-10-CM | POA: Diagnosis not present

## 2023-08-21 DIAGNOSIS — E1165 Type 2 diabetes mellitus with hyperglycemia: Secondary | ICD-10-CM | POA: Diagnosis not present

## 2023-08-21 DIAGNOSIS — D509 Iron deficiency anemia, unspecified: Secondary | ICD-10-CM | POA: Diagnosis not present

## 2023-08-21 DIAGNOSIS — Z96642 Presence of left artificial hip joint: Secondary | ICD-10-CM

## 2023-08-21 DIAGNOSIS — E7800 Pure hypercholesterolemia, unspecified: Secondary | ICD-10-CM

## 2023-08-21 DIAGNOSIS — R748 Abnormal levels of other serum enzymes: Secondary | ICD-10-CM

## 2023-08-21 LAB — HEPATIC FUNCTION PANEL
ALT: 8 U/L (ref 0–35)
AST: 16 U/L (ref 0–37)
Albumin: 3.9 g/dL (ref 3.5–5.2)
Alkaline Phosphatase: 123 U/L — ABNORMAL HIGH (ref 39–117)
Bilirubin, Direct: 0.1 mg/dL (ref 0.0–0.3)
Total Bilirubin: 0.4 mg/dL (ref 0.2–1.2)
Total Protein: 7.6 g/dL (ref 6.0–8.3)

## 2023-08-21 LAB — CBC WITH DIFFERENTIAL/PLATELET
Basophils Absolute: 0 10*3/uL (ref 0.0–0.1)
Basophils Relative: 0.4 % (ref 0.0–3.0)
Eosinophils Absolute: 0.1 10*3/uL (ref 0.0–0.7)
Eosinophils Relative: 1.6 % (ref 0.0–5.0)
HCT: 36.3 % (ref 36.0–46.0)
Hemoglobin: 12 g/dL (ref 12.0–15.0)
Lymphocytes Relative: 46.3 % — ABNORMAL HIGH (ref 12.0–46.0)
Lymphs Abs: 2.5 10*3/uL (ref 0.7–4.0)
MCHC: 33.1 g/dL (ref 30.0–36.0)
MCV: 94.4 fl (ref 78.0–100.0)
Monocytes Absolute: 0.3 10*3/uL (ref 0.1–1.0)
Monocytes Relative: 5.8 % (ref 3.0–12.0)
Neutro Abs: 2.5 10*3/uL (ref 1.4–7.7)
Neutrophils Relative %: 45.9 % (ref 43.0–77.0)
Platelets: 332 10*3/uL (ref 150.0–400.0)
RBC: 3.85 Mil/uL — ABNORMAL LOW (ref 3.87–5.11)
RDW: 16.1 % — ABNORMAL HIGH (ref 11.5–15.5)
WBC: 5.4 10*3/uL (ref 4.0–10.5)

## 2023-08-21 LAB — LIPID PANEL
Cholesterol: 169 mg/dL (ref 0–200)
HDL: 68.7 mg/dL (ref >39.00)
LDL Cholesterol: 88 mg/dL (ref 0–99)
NonHDL: 100.36
Total CHOL/HDL Ratio: 2
Triglycerides: 64 mg/dL (ref 0.0–149.0)
VLDL: 12.8 mg/dL (ref 0.0–40.0)

## 2023-08-21 LAB — BASIC METABOLIC PANEL WITH GFR
BUN: 11 mg/dL (ref 6–23)
CO2: 29 meq/L (ref 19–32)
Calcium: 9 mg/dL (ref 8.4–10.5)
Chloride: 103 meq/L (ref 96–112)
Creatinine, Ser: 0.89 mg/dL (ref 0.40–1.20)
GFR: 63.1 mL/min (ref >60.00)
Glucose, Bld: 87 mg/dL (ref 70–99)
Potassium: 4 meq/L (ref 3.5–5.1)
Sodium: 139 meq/L (ref 135–145)

## 2023-08-21 LAB — IBC + FERRITIN
Ferritin: 281.3 ng/mL (ref 10.0–291.0)
Iron: 57 ug/dL (ref 42–145)
Saturation Ratios: 25.4 % (ref 20.0–50.0)
TIBC: 224 ug/dL — ABNORMAL LOW (ref 250.0–450.0)
Transferrin: 160 mg/dL — ABNORMAL LOW (ref 212.0–360.0)

## 2023-08-21 LAB — HEMOGLOBIN A1C: Hgb A1c MFr Bld: 6.1 % (ref 4.6–6.5)

## 2023-08-21 LAB — TSH: TSH: 3.4 u[IU]/mL (ref 0.35–5.50)

## 2023-08-21 NOTE — Progress Notes (Signed)
Order placed for f/u labs.  

## 2023-08-21 NOTE — Assessment & Plan Note (Signed)
On synthroid.  Check tsh today.

## 2023-08-21 NOTE — Assessment & Plan Note (Signed)
 Continue crestor .  Follow lipid panel and liver function tests.  Check with fasting labs today.  Lab Results  Component Value Date   CHOL 144 12/19/2022   HDL 69.20 12/19/2022   LDLCALC 60 12/19/2022   TRIG 73.0 12/19/2022   CHOLHDL 2 12/19/2022

## 2023-08-21 NOTE — Assessment & Plan Note (Signed)
 Remain off blood pressure medication.  Pressure lower in left arm compared to right arm. Discussed further w/up today. Wants to hold at this time.  Follow.

## 2023-08-21 NOTE — Assessment & Plan Note (Addendum)
 Physical today 08/21/23.  Mammogram 01/2020 - birads I. Overdue mammogram.  Discussed. Declines. Colonoscopy 09/27/20 - pathology - rectal polyp - hyperplastic polyp.  Recommended no f/u colonoscopy.

## 2023-08-21 NOTE — Assessment & Plan Note (Signed)
 MRA brain performed during previous w/up for headaches. MRA - Mildly motion degraded exam. No intracranial large vessel occlusion or proximal high-grade arterial stenosis.  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 Both posterior cerebral arteries demonstrate distal branch atherosclerotic irregularity. 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 Apparent 1-2 mm broad-based vascular protrusion arising from the supraclinoid right ICA, which may reflect a small aneurysm or artifact related to motion.  Had referred her to neurology.  It appears they tried to schedule an appt.  No headache currently. She previously declined further w/up.  Follow.

## 2023-08-21 NOTE — Progress Notes (Signed)
 Subjective:    Patient ID: SPENCER SCHIFANO, female    DOB: 1946/02/29, 78 y.o.   MRN: [id number]  Patient here for  Chief Complaint  Patient presents with   Annual Exam    HPI Here for a physical exam. Had f/u with hematology 07/03/23 - f/u anemia. Recommended to monitor her anemia conservatively and if remains between 10-11 - no need for any further investigations. Is s/p left anterior total replacement (hip). S/p PT. Doing well. Staying active. No chest pain or sob. No cough or congestion. No abdominal pain or bowel change. No hip pain. Overall feels good. Some limited rom in shoulders, but has improved. Doing exercise at home.    Past Medical History:  Diagnosis Date   Anemia    Angioedema due to angiotensin converting enzyme inhibitor (ACE-I)    Avascular necrosis (HCC)    Decreased GFR    DM (diabetes mellitus), type 2 (HCC)    no meds   Esophagitis    Goiter    s/p thyroidectomy   History of colonic polyps    Hypercholesterolemia    Hypertension    no current medications   Hypothyroidism    Marijuana use    Recurrent sinus infections    and allergy problems   Syncopal episodes    last one 12-16-22   Temporal arteritis (HCC)    Tobacco use    Uterine fibroid    Past Surgical History:  Procedure Laterality Date   ARTERY BIOPSY Right 03/13/2021   Procedure: BIOPSY TEMPORAL ARTERY;  Surgeon: Marshall Skeeter, MD;  Location: [facility] ORS;  Service: General;  Laterality: Right;   BREAST BIOPSY     COLONOSCOPY WITH PROPOFOL  N/A 11/03/2014   Procedure: COLONOSCOPY WITH PROPOFOL ;  Surgeon: Cassie Click, MD;  Location: [facility] ENDOSCOPY;  Service: Endoscopy;  Laterality: N/A;   COLONOSCOPY WITH PROPOFOL  N/A 02/12/2018   Procedure: COLONOSCOPY WITH PROPOFOL ;  Surgeon: Cassie Click, MD;  Location: [facility] ENDOSCOPY;  Service: Endoscopy;  Laterality: N/A;   DILATION AND CURETTAGE OF UTERUS  1980 & 1993   ESOPHAGOGASTRODUODENOSCOPY N/A 11/03/2014   Procedure:  ESOPHAGOGASTRODUODENOSCOPY (EGD);  Surgeon: Cassie Click, MD;  Location: [facility] ENDOSCOPY;  Service: Endoscopy;  Laterality: N/A;   LAPAROSCOPIC APPENDECTOMY N/A 11/26/2014   Procedure: APPENDECTOMY LAPAROSCOPIC;  Surgeon: Marshall Skeeter, MD;  Location: [facility] ORS;  Service: General;  Laterality: N/A;   thyroidectomy  1979   goiter   TOTAL HIP ARTHROPLASTY Left 01/03/2023   Procedure: TOTAL HIP ARTHROPLASTY ANTERIOR APPROACH -RNFA;  Surgeon: Venus Ginsberg, MD;  Location: [facility] ORS;  Service: Orthopedics;  Laterality: Left;   TUBAL LIGATION  1975   Family History  Problem Relation Age of Onset   Tuberculosis Paternal Grandfather    Alzheimer's disease Mother    Goiter Mother    Cancer Brother    Cancer Brother    Cancer Brother    Cancer Brother    Social History   Socioeconomic History   Marital status: Married    Spouse name: Not on file   Number of children: Not on file   Years of education: Not on file   Highest education level: Not on file  Occupational History   Not on file  Tobacco Use   Smoking status: Former    Current packs/day: 0.00    Types: Cigarettes    Quit date: 04/16/2009    Years since quitting: 14.3   Smokeless tobacco: Never  Vaping Use   Vaping status: Never  Used  Substance and Sexual Activity   Alcohol use: Yes    Alcohol/week: 3.0 - 4.0 standard drinks of alcohol    Types: 3 - 4 Standard drinks or equivalent per week    Comment: rare   Drug use: Yes    Types: Marijuana    Comment: marijuana every day   Sexual activity: Not on file  Other Topics Concern   Not on file  Social History Narrative   Not on file   Social Drivers of Health   Financial Resource Strain: Low Risk  (05/25/2022)   Overall Financial Resource Strain (CARDIA)    Difficulty of Paying Living Expenses: Not hard at all  Food Insecurity: No Food Insecurity (01/03/2023)   Hunger Vital Sign    Worried About Running Out of Food in the Last Year: Never true    Ran Out of Food in  the Last Year: Never true  Transportation Needs: No Transportation Needs (01/03/2023)   PRAPARE - Administrator, Civil Service (Medical): No    Lack of Transportation (Non-Medical): No  Physical Activity: Insufficiently Active (05/25/2022)   Exercise Vital Sign    Days of Exercise per Week: 3 days    Minutes of Exercise per Session: 30 min  Stress: No Stress Concern Present (05/25/2022)   [employer] of Occupational Health - Occupational Stress Questionnaire    Feeling of Stress : Not at all  Social Connections: Unknown (05/25/2022)   Social Connection and Isolation Panel [NHANES]    Frequency of Communication with Friends and Family: More than three times a week    Frequency of Social Gatherings with Friends and Family: More than three times a week    Attends Religious Services: Not on Marketing executive or Organizations: Not on file    Attends Banker Meetings: Not on file    Marital Status: Not on file     Review of Systems  Constitutional:  Negative for appetite change and unexpected weight change.  HENT:  Negative for congestion, sinus pressure and sore throat.   Eyes:  Negative for pain and visual disturbance.  Respiratory:  Negative for cough, chest tightness and shortness of breath.   Cardiovascular:  Negative for chest pain, palpitations and leg swelling.  Gastrointestinal:  Negative for abdominal pain, diarrhea, nausea and vomiting.  Genitourinary:  Negative for difficulty urinating and dysuria.  Musculoskeletal:  Negative for joint swelling and myalgias.       Limited rom - shoulders. No pain.   Skin:  Negative for color change and rash.  Neurological:  Negative for dizziness and headaches.  Hematological:  Negative for adenopathy. Does not bruise/bleed easily.  Psychiatric/Behavioral:  Negative for agitation and dysphoric mood.        Objective:     BP 112/68   Pulse 81   Temp 98 F (36.7 C)   Resp 16   Ht 5\' 3"  (1.6 m)    Wt 113 lb (51.3 kg)   SpO2 99%   BMI 20.02 kg/m  Wt Readings from Last 3 Encounters:  08/21/23 113 lb (51.3 kg)  07/03/23 115 lb (52.2 kg)  04/02/23 119 lb (54 kg)    Physical Exam Vitals reviewed.  Constitutional:      General: She is not in acute distress.    Appearance: Normal appearance. She is well-developed.  HENT:     Head: Normocephalic and atraumatic.     Right Ear: External ear normal.  Left Ear: External ear normal.     Mouth/Throat:     Pharynx: No oropharyngeal exudate or posterior oropharyngeal erythema.  Eyes:     General: No scleral icterus.       Right eye: No discharge.        Left eye: No discharge.     Conjunctiva/sclera: Conjunctivae normal.  Neck:     Thyroid : No thyromegaly.  Cardiovascular:     Rate and Rhythm: Normal rate and regular rhythm.  Pulmonary:     Effort: No tachypnea, accessory muscle usage or respiratory distress.     Breath sounds: Normal breath sounds. No decreased breath sounds or wheezing.  Chest:  Breasts:    Right: No inverted nipple, mass, nipple discharge or tenderness (no axillary adenopathy).     Left: No inverted nipple, mass, nipple discharge or tenderness (no axilarry adenopathy).  Abdominal:     General: Bowel sounds are normal.     Palpations: Abdomen is soft.     Tenderness: There is no abdominal tenderness.  Musculoskeletal:        General: No swelling or tenderness.     Cervical back: Neck supple.     Comments: Limited rom - right and left shoulder with abduction - at 90 degrees.   Lymphadenopathy:     Cervical: No cervical adenopathy.  Skin:    Findings: No erythema or rash.  Neurological:     Mental Status: She is alert and oriented to person, place, and time.  Psychiatric:        Mood and Affect: Mood normal.        Behavior: Behavior normal.      Diabetic foot exam was performed with the following findings:   No deformities, ulcerations, or other skin breakdown Normal sensation of 10g  monofilament Intact posterior tibialis and dorsalis pedis pulses      Outpatient Encounter Medications as of 08/21/2023  Medication Sig   ferrous sulfate  325 (65 FE) MG tablet Take 50 mg by mouth daily with breakfast.   levothyroxine  (SYNTHROID ) 50 MCG tablet TAKE 1 TABLET BY MOUTH EVERY DAY   No facility-administered encounter medications on file as of 08/21/2023.     Lab Results  Component Value Date   WBC 5.5 07/03/2023   HGB 10.9 (L) 07/03/2023   HCT 33.5 (L) 07/03/2023   PLT 306 07/03/2023   GLUCOSE 84 04/02/2023   CHOL 144 12/19/2022   TRIG 73.0 12/19/2022   HDL 69.20 12/19/2022   LDLCALC 60 12/19/2022   ALT 7 04/02/2023   AST 15 04/02/2023   NA 137 04/02/2023   K 4.2 04/02/2023   CL 102 04/02/2023   CREATININE 0.93 04/02/2023   BUN 14 04/02/2023   CO2 27 04/02/2023   TSH 0.57 04/02/2023   INR 1.2 06/07/2021   HGBA1C 6.2 04/02/2023   MICROALBUR <0.7 04/02/2023    DG HIP UNILAT WITH PELVIS 1V LEFT Result Date: 01/03/2023 CLINICAL DATA:  Elective surgery. EXAM: DG HIP (WITH OR WITHOUT PELVIS) 1V*L* COMPARISON:  None Available. FINDINGS: Three fluoroscopic spot views of the pelvis and left hip obtained in the operating room. Images during hip arthroplasty. Fluoroscopy time 14 seconds. Dose 1.094 mGy. IMPRESSION: Intraoperative fluoroscopy during left hip arthroplasty. Electronically Signed   By: Chadwick Colonel M.D.   On: 01/03/2023 16:08   DG C-Arm 1-60 Min-No Report Result Date: 01/03/2023 Fluoroscopy was utilized by the requesting physician.  No radiographic interpretation.   DG C-Arm 1-60 Min-No Report Result Date: 01/03/2023 Fluoroscopy was  utilized by the requesting physician.  No radiographic interpretation.       Assessment & Plan:  Routine general medical examination at a health care facility  Type 2 diabetes mellitus with hyperglycemia, without long-term current use of insulin (HCC) Assessment & Plan: Follow met b and A1c.  Off prednisone . Recheck A1c  today.   Orders: -     Hemoglobin A1c -     Basic metabolic panel with GFR  Hypercholesterolemia Assessment & Plan: Continue crestor .  Follow lipid panel and liver function tests.  Check with fasting labs today.  Lab Results  Component Value Date   CHOL 144 12/19/2022   HDL 69.20 12/19/2022   LDLCALC 60 12/19/2022   TRIG 73.0 12/19/2022   CHOLHDL 2 12/19/2022    Orders: -     Hepatic function panel -     Lipid panel  Health care maintenance Assessment & Plan: Physical today 08/21/23.  Mammogram 01/2020 - birads I. Overdue mammogram.  Discussed. Declines. Colonoscopy 09/27/20 - pathology - rectal polyp - hyperplastic polyp.  Recommended no f/u colonoscopy.    Hypothyroidism, unspecified type Assessment & Plan: On synthroid . Check tsh today   Orders: -     TSH  Iron  deficiency anemia, unspecified iron  deficiency anemia type Assessment & Plan: Saw hematology 10/31/22.  Previously received IV iron  infusions. Saw GI.  CT - no acute intraabdominal abnormality.  Deferred colonoscopy and EGD. Follow cbc and iron  studies. Will check today. Dr Randy Buttery recommended recheck in between appts with her.   Orders: -     IBC + Ferritin -     CBC with Differential/Platelet  Abnormal MRA, brain Assessment & Plan: MRA brain performed during previous w/up for headaches. MRA - Mildly motion degraded exam. No intracranial large vessel occlusion or proximal high-grade arterial stenosis.  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 Both posterior cerebral arteries demonstrate distal branch atherosclerotic irregularity. 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 Apparent 1-2 mm broad-based vascular protrusion arising from the supraclinoid right ICA, which may reflect a small aneurysm or artifact related to motion.  Had referred her to neurology.  It appears they tried to schedule an appt.  No headache currently. She previously declined further w/up.   Follow.    Essential hypertension, benign Assessment & Plan: Remain off blood pressure medication.  Pressure lower in left arm compared to right arm. Discussed further w/up today. Wants to hold at this time.  Follow.    S/P total left hip arthroplasty Assessment & Plan: Doing well. Has f/u with ortho next month.       Dellar Fenton, MD

## 2023-08-21 NOTE — Assessment & Plan Note (Signed)
 Doing well. Has f/u with ortho next month.

## 2023-08-21 NOTE — Assessment & Plan Note (Signed)
 Follow met b and A1c.  Off prednisone . Recheck A1c today.

## 2023-08-21 NOTE — Assessment & Plan Note (Signed)
 Saw hematology 10/31/22.  Previously received IV iron  infusions. Saw GI.  CT - no acute intraabdominal abnormality.  Deferred colonoscopy and EGD. Follow cbc and iron  studies. Will check today. Dr Randy Buttery recommended recheck in between appts with her.

## 2023-08-22 ENCOUNTER — Other Ambulatory Visit: Payer: Self-pay

## 2023-08-22 MED ORDER — ROSUVASTATIN CALCIUM 5 MG PO TABS: 5.0000 mg | ORAL_TABLET | Freq: Every day | ORAL | 3 refills | Status: AC

## 2023-09-18 ENCOUNTER — Other Ambulatory Visit (INDEPENDENT_AMBULATORY_CARE_PROVIDER_SITE_OTHER)

## 2023-09-18 DIAGNOSIS — R748 Abnormal levels of other serum enzymes: Secondary | ICD-10-CM | POA: Diagnosis not present

## 2023-09-18 LAB — HEPATIC FUNCTION PANEL
ALT: 12 U/L (ref 0–35)
AST: 18 U/L (ref 0–37)
Albumin: 3.6 g/dL (ref 3.5–5.2)
Alkaline Phosphatase: 95 U/L (ref 39–117)
Bilirubin, Direct: 0.1 mg/dL (ref 0.0–0.3)
Total Bilirubin: 0.3 mg/dL (ref 0.2–1.2)
Total Protein: 7.3 g/dL (ref 6.0–8.3)

## 2023-09-18 LAB — GAMMA GT: GGT: 14 U/L (ref 7–51)

## 2023-09-19 ENCOUNTER — Ambulatory Visit: Payer: Self-pay | Admitting: Internal Medicine

## 2023-09-26 ENCOUNTER — Other Ambulatory Visit: Payer: Self-pay

## 2023-09-26 MED ORDER — LEVOTHYROXINE SODIUM 50 MCG PO TABS: 50.0000 ug | ORAL_TABLET | Freq: Every day | ORAL | 1 refills | Status: DC

## 2023-10-23 DIAGNOSIS — Z96642 Presence of left artificial hip joint: Secondary | ICD-10-CM | POA: Diagnosis not present

## 2023-10-30 ENCOUNTER — Inpatient Hospital Stay: Attending: Oncology

## 2023-10-30 DIAGNOSIS — D508 Other iron deficiency anemias: Secondary | ICD-10-CM | POA: Insufficient documentation

## 2023-10-30 LAB — FERRITIN: Ferritin: 263 ng/mL (ref 11–307)

## 2023-10-30 LAB — CBC
HCT: 36.6 % (ref 36.0–46.0)
Hemoglobin: 11.9 g/dL — ABNORMAL LOW (ref 12.0–15.0)
MCH: 30.8 pg (ref 26.0–34.0)
MCHC: 32.5 g/dL (ref 30.0–36.0)
MCV: 94.8 fL (ref 80.0–100.0)
Platelets: 290 K/uL (ref 150–400)
RBC: 3.86 MIL/uL — ABNORMAL LOW (ref 3.87–5.11)
RDW: 14.8 % (ref 11.5–15.5)
WBC: 7 K/uL (ref 4.0–10.5)
nRBC: 0 % (ref 0.0–0.2)

## 2023-10-30 LAB — IRON AND TIBC
Iron: 45 ug/dL (ref 28–170)
Saturation Ratios: 18 % (ref 10.4–31.8)
TIBC: 255 ug/dL (ref 250–450)
UIBC: 210 ug/dL

## 2023-11-01 ENCOUNTER — Other Ambulatory Visit

## 2023-11-13 ENCOUNTER — Ambulatory Visit

## 2023-11-13 DIAGNOSIS — H2513 Age-related nuclear cataract, bilateral: Secondary | ICD-10-CM | POA: Diagnosis not present

## 2023-11-13 DIAGNOSIS — H40033 Anatomical narrow angle, bilateral: Secondary | ICD-10-CM | POA: Diagnosis not present

## 2023-11-25 ENCOUNTER — Encounter: Payer: Self-pay | Admitting: Ophthalmology

## 2023-11-26 ENCOUNTER — Encounter: Payer: Self-pay | Admitting: Ophthalmology

## 2023-11-26 NOTE — Anesthesia Preprocedure Evaluation (Addendum)
 Anesthesia Evaluation  Patient identified by MRN, date of birth, ID band Patient awake    Reviewed: Allergy & Precautions, H&P , NPO status , Patient's Chart, lab work & pertinent test results  Airway Mallampati: III  TM Distance: >3 FB Neck ROM: Full    Dental no notable dental hx.    Pulmonary neg pulmonary ROS, Patient abstained from smoking., former smoker   Pulmonary exam normal breath sounds clear to auscultation       Cardiovascular hypertension, negative cardio ROS Normal cardiovascular exam Rhythm:Regular Rate:Normal     Neuro/Psych  Headaches negative neurological ROS  negative psych ROS   GI/Hepatic negative GI ROS, Neg liver ROS,,,  Endo/Other  negative endocrine ROSdiabetesHypothyroidism    Renal/GU negative Renal ROS  negative genitourinary   Musculoskeletal negative musculoskeletal ROS (+)    Abdominal   Peds negative pediatric ROS (+)  Hematology negative hematology ROS (+) Blood dyscrasia, anemia   Anesthesia Other Findings Note marijuana use; may need more analgesia due to THC use  Goiter  Hypercholesterolemia Recurrent sinus infections Hypothyroidism Uterine fibroid  Tobacco use Esophagitis  Decreased GFR Angioedema due to angiotensin converting enzyme inhibitor (ACE-I) History of colonic polyps Anemia  Avascular necrosis (HCC) Hypertension  Temporal arteritis (HCC) Syncopal episodes  DM (diabetes mellitus), type 2 (HCC) Marijuana use  Type 2 diabetes mellitus (HCC)    Reproductive/Obstetrics negative OB ROS                              Anesthesia Physical Anesthesia Plan  ASA: 2  Anesthesia Plan: MAC   Post-op Pain Management:    Induction: Intravenous  PONV Risk Score and Plan:   Airway Management Planned: Natural Airway and Nasal Cannula  Additional Equipment:   Intra-op Plan:   Post-operative Plan:   Informed Consent: I have reviewed  the patients History and Physical, chart, labs and discussed the procedure including the risks, benefits and alternatives for the proposed anesthesia with the patient or authorized representative who has indicated his/her understanding and acceptance.     Dental Advisory Given  Plan Discussed with: Anesthesiologist, CRNA and Surgeon  Anesthesia Plan Comments: (Patient consented for risks of anesthesia including but not limited to:  - adverse reactions to medications - damage to eyes, teeth, lips or other oral mucosa - nerve damage due to positioning  - sore throat or hoarseness - Damage to heart, brain, nerves, lungs, other parts of body or loss of life  Patient voiced understanding and assent.)         Anesthesia Quick Evaluation

## 2023-11-27 DIAGNOSIS — H2589 Other age-related cataract: Secondary | ICD-10-CM | POA: Diagnosis not present

## 2023-11-27 DIAGNOSIS — H2512 Age-related nuclear cataract, left eye: Secondary | ICD-10-CM | POA: Diagnosis not present

## 2023-11-27 DIAGNOSIS — H2513 Age-related nuclear cataract, bilateral: Secondary | ICD-10-CM | POA: Diagnosis not present

## 2023-12-03 NOTE — Discharge Instructions (Signed)

## 2023-12-05 ENCOUNTER — Encounter
Admission: RE | Disposition: A | Discharge status: Home / Self Care | Payer: Self-pay | Source: Home / Self Care | Attending: Ophthalmology

## 2023-12-05 ENCOUNTER — Ambulatory Visit
Admission: RE | Admit: 2023-12-05 | Discharge: 2023-12-05 | Disposition: A | Discharge status: Home / Self Care | Attending: Ophthalmology | Admitting: Ophthalmology

## 2023-12-05 ENCOUNTER — Other Ambulatory Visit: Payer: Self-pay

## 2023-12-05 ENCOUNTER — Ambulatory Visit: Payer: Self-pay | Admitting: Anesthesiology

## 2023-12-05 ENCOUNTER — Encounter: Payer: Self-pay | Admitting: Ophthalmology

## 2023-12-05 ENCOUNTER — Encounter: Payer: Self-pay | Admitting: Anesthesiology

## 2023-12-05 DIAGNOSIS — E039 Hypothyroidism, unspecified: Secondary | ICD-10-CM | POA: Diagnosis not present

## 2023-12-05 DIAGNOSIS — E1136 Type 2 diabetes mellitus with diabetic cataract: Secondary | ICD-10-CM | POA: Diagnosis not present

## 2023-12-05 DIAGNOSIS — D649 Anemia, unspecified: Secondary | ICD-10-CM | POA: Insufficient documentation

## 2023-12-05 DIAGNOSIS — I10 Essential (primary) hypertension: Secondary | ICD-10-CM | POA: Diagnosis not present

## 2023-12-05 DIAGNOSIS — Z87891 Personal history of nicotine dependence: Secondary | ICD-10-CM | POA: Diagnosis not present

## 2023-12-05 DIAGNOSIS — H2512 Age-related nuclear cataract, left eye: Secondary | ICD-10-CM | POA: Insufficient documentation

## 2023-12-05 DIAGNOSIS — E890 Postprocedural hypothyroidism: Secondary | ICD-10-CM | POA: Insufficient documentation

## 2023-12-05 HISTORY — PX: CATARACT EXTRACTION W/PHACO: SHX586

## 2023-12-05 HISTORY — DX: Type 2 diabetes mellitus without complications: E11.9

## 2023-12-05 SURGERY — PHACOEMULSIFICATION, CATARACT, WITH IOL INSERTION
Anesthesia: Monitor Anesthesia Care | Site: Eye | Laterality: Left

## 2023-12-05 MED ORDER — CYCLOPENTOLATE HCL 2 % OP SOLN
1.0000 [drp] | OPHTHALMIC | Status: AC
  Administered 2023-12-05 (×3): 1 [drp] via OPHTHALMIC

## 2023-12-05 MED ORDER — TRYPAN BLUE 0.06 % IO SOSY
PREFILLED_SYRINGE | INTRAOCULAR | Status: DC | PRN
  Administered 2023-12-05: .5 mL via INTRAOCULAR

## 2023-12-05 MED ORDER — MIDAZOLAM HCL 2 MG/2ML IJ SOLN
INTRAMUSCULAR | Status: AC
  Filled 2023-12-05: qty 2

## 2023-12-05 MED ORDER — FENTANYL CITRATE (PF) 100 MCG/2ML IJ SOLN
INTRAMUSCULAR | Status: DC | PRN
  Administered 2023-12-05 (×2): 50 ug via INTRAVENOUS

## 2023-12-05 MED ORDER — LIDOCAINE HCL (PF) 2 % IJ SOLN
INTRAOCULAR | Status: DC | PRN
  Administered 2023-12-05: 4 mL via INTRAOCULAR

## 2023-12-05 MED ORDER — PHENYLEPHRINE-KETOROLAC 1-0.3 % IO SOLN
INTRAOCULAR | Status: DC | PRN
  Administered 2023-12-05: 92 mL via OPHTHALMIC

## 2023-12-05 MED ORDER — MOXIFLOXACIN HCL 0.5 % OP SOLN
OPHTHALMIC | Status: DC | PRN
  Administered 2023-12-05: .2 mL via OPHTHALMIC

## 2023-12-05 MED ORDER — TETRACAINE HCL 0.5 % OP SOLN
OPHTHALMIC | Status: AC
  Filled 2023-12-05: qty 4

## 2023-12-05 MED ORDER — TETRACAINE HCL 0.5 % OP SOLN
1.0000 [drp] | OPHTHALMIC | Status: AC
  Administered 2023-12-05 (×3): 1 [drp] via OPHTHALMIC

## 2023-12-05 MED ORDER — SIGHTPATH DOSE#1 BSS IO SOLN
INTRAOCULAR | Status: DC | PRN
  Administered 2023-12-05: 15 mL via INTRAOCULAR

## 2023-12-05 MED ORDER — FENTANYL CITRATE (PF) 100 MCG/2ML IJ SOLN
INTRAMUSCULAR | Status: AC
Start: 2023-12-05 — End: 2023-12-05
  Filled 2023-12-05: qty 2

## 2023-12-05 MED ORDER — PHENYLEPHRINE HCL 10 % OP SOLN
OPHTHALMIC | Status: AC
  Filled 2023-12-05: qty 5

## 2023-12-05 MED ORDER — CYCLOPENTOLATE HCL 2 % OP SOLN
OPHTHALMIC | Status: AC
  Filled 2023-12-05: qty 2

## 2023-12-05 MED ORDER — SIGHTPATH DOSE#1 NA HYALUR & NA CHOND-NA HYALUR IO KIT
PACK | INTRAOCULAR | Status: DC | PRN
  Administered 2023-12-05: 1 via OPHTHALMIC

## 2023-12-05 MED ORDER — BRIMONIDINE TARTRATE-TIMOLOL 0.2-0.5 % OP SOLN
OPHTHALMIC | Status: DC | PRN
  Administered 2023-12-05: 1 [drp] via OPHTHALMIC

## 2023-12-05 MED ORDER — ARMC OPHTHALMIC DILATING DROPS
OPHTHALMIC | Status: AC
Start: 2023-12-05 — End: 2023-12-05
  Filled 2023-12-05: qty 0.5

## 2023-12-05 MED ORDER — MIDAZOLAM HCL 2 MG/2ML IJ SOLN
INTRAMUSCULAR | Status: DC | PRN
  Administered 2023-12-05: 2 mg via INTRAVENOUS

## 2023-12-05 MED ORDER — PHENYLEPHRINE HCL 10 % OP SOLN
1.0000 [drp] | OPHTHALMIC | Status: AC
  Administered 2023-12-05 (×3): 1 [drp] via OPHTHALMIC

## 2023-12-05 SURGICAL SUPPLY — 10 items
DISSECTOR HYDRO NUCLEUS 50X22 (MISCELLANEOUS) ×1 IMPLANT
DRSG TEGADERM 2-3/8X2-3/4 SM (GAUZE/BANDAGES/DRESSINGS) ×1 IMPLANT
FEE CATARACT SUITE SIGHTPATH (MISCELLANEOUS) ×1 IMPLANT
GLOVE BIOGEL PI IND STRL 8 (GLOVE) ×1 IMPLANT
GLOVE SURG LX STRL 7.5 STRW (GLOVE) ×1 IMPLANT
GLOVE SURG SYN 6.5 PF PI BL (GLOVE) ×1 IMPLANT
LENS IOL TECNIS MONO 22.0 (Intraocular Lens) IMPLANT
NDL FILTER BLUNT 18X1 1/2 (NEEDLE) ×1 IMPLANT
NEEDLE FILTER BLUNT 18X1 1/2 (NEEDLE) ×1 IMPLANT
SYR 3ML LL SCALE MARK (SYRINGE) ×1 IMPLANT

## 2023-12-05 NOTE — Op Note (Signed)
 OPERATIVE NOTE  JOLAYNE BRANSON [id number] 12/05/2023   PREOPERATIVE DIAGNOSIS: Nuclear sclerotic cataract left eye. H25.12   POSTOPERATIVE DIAGNOSIS: Nuclear sclerotic cataract left eye. H25.12   PROCEDURE:  Phacoemulsification with posterior chamber intraocular lens placement of the left eye  Ultrasound time: Procedure(s): PHACOEMULSIFICATION, CATARACT, WITH IOL INSERTION 22.49 01:56.2 (Left)  LENS:   Implant Name Type Inv. Item Serial No. Manufacturer Lot No. LRB No. Used Action  LENS IOL TECNIS MONO 22.0 - [id number] Intraocular Lens LENS IOL TECNIS MONO 22.0 [id number] [employer]  Left 1 Implanted      SURGEON:  Feliciano HERO. Enola, MD   ANESTHESIA:  Topical with tetracaine  drops, augmented with 1% preservative-free intracameral lidocaine .   COMPLICATIONS:  None.   DESCRIPTION OF PROCEDURE:  The patient was identified in the holding room and transported to the operating room and placed in the supine position under the operating microscope.  The left eye was identified as the operative eye, which was prepped and draped in the usual sterile ophthalmic fashion.   A 1 millimeter clear-corneal paracentesis was made inferotemporally. Preservative-free 1% lidocaine  mixed with 1:1,000 bisulfite-free aqueous solution of epinephrine  was injected into the anterior chamber. There was no red reflex due to the density of the cataract so Trypan blue  was injected intracamerally to stain the anterior capsule and facilitate safe creation of the capsulorrhexis. The anterior chamber was then filled with Viscoat viscoelastic. A 2.4 millimeter keratome was used to make a clear-corneal incision superotemporally. A curvilinear capsulorrhexis was made with a cystotome and capsulorrhexis forceps. Balanced salt  solution was used to hydrodissect and hydrodelineate the nucleus. Phacoemulsification was then used to remove the lens nucleus and epinucleus. The remaining cortex was then removed using the irrigation and  aspiration handpiece. Provisc was then placed into the capsular bag to distend it for lens placement. A +22.00 D [id number] intraocular lens was then injected into the capsular bag. The remaining viscoelastic was aspirated.   Wounds were hydrated with balanced salt  solution.  The anterior chamber was inflated to a physiologic pressure with balanced salt  solution.  No wound leaks were noted. Moxifloxacin  was injected intracamerally.  Timolol  and Brimonidine  drops were applied to the eye.  The patient was taken to the recovery room in stable condition without complications of anesthesia or surgery.  Feliciano Hugger [place] 12/05/2023, 11:34 AM

## 2023-12-05 NOTE — H&P (Signed)
 [facility]   Primary Care Physician:  Glendia Shad, MD Ophthalmologist: Dr. Feliciano Ober  Pre-Procedure History & Physical: HPI:  Jessica Wall is a 78 y.o. female here for cataract surgery.   Past Medical History:  Diagnosis Date   Anemia    Angioedema due to angiotensin converting enzyme inhibitor (ACE-I)    Avascular necrosis (HCC)    Decreased GFR    DM (diabetes mellitus), type 2 (HCC)    no meds   Esophagitis    Goiter    s/p thyroidectomy   History of colonic polyps    Hypercholesterolemia    Hypertension    no current medications   Hypothyroidism    Marijuana use    Recurrent sinus infections    and allergy problems   Syncopal episodes    last one 12-16-22   Temporal arteritis (HCC)    Tobacco use    Type 2 diabetes mellitus (HCC)    Uterine fibroid     Past Surgical History:  Procedure Laterality Date   ARTERY BIOPSY Right 03/13/2021   Procedure: BIOPSY TEMPORAL ARTERY;  Surgeon: Dessa Reyes ORN, MD;  Location: [facility] ORS;  Service: General;  Laterality: Right;   BREAST BIOPSY     COLONOSCOPY WITH PROPOFOL  N/A 11/03/2014   Procedure: COLONOSCOPY WITH PROPOFOL ;  Surgeon: Lamar ONEIDA Holmes, MD;  Location[facility] ENDOSCOPY;  Service: Endoscopy;  Laterality: N/A;   COLONOSCOPY WITH PROPOFOL  N/A 02/12/2018   Procedure: COLONOSCOPY WITH PROPOFOL ;  Surgeon: Holmes Lamar ONEIDA, MD;  Location: [facility] ENDOSCOPY;  Service: Endoscopy;  Laterality: N/A;   DILATION AND CURETTAGE OF UTERUS  1980 & 1993   ESOPHAGOGASTRODUODENOSCOPY N/A 11/03/2014   Procedure: ESOPHAGOGASTRODUODENOSCOPY (EGD);  Surgeon: Lamar ONEIDA Holmes, MD;  Location: [facility] ENDOSCOPY;  Service: Endoscopy;  Laterality: N/A;   LAPAROSCOPIC APPENDECTOMY N/A 11/26/2014   Procedure: APPENDECTOMY LAPAROSCOPIC;  Surgeon: Reyes ORN Dessa, MD;  Location: [facility] ORS;  Service: General;  Laterality: N/A;   thyroidectomy  1979   goiter   TOTAL HIP ARTHROPLASTY Left 01/03/2023   Procedure: TOTAL HIP ARTHROPLASTY  ANTERIOR APPROACH -RNFA;  Surgeon: Lorelle Hussar, MD;  Location: [facility] ORS;  Service: Orthopedics;  Laterality: Left;   TUBAL LIGATION  1975    Prior to Admission medications   Medication Sig Start Date End Date Taking? Authorizing Provider  ferrous sulfate  325 (65 FE) MG tablet Take 50 mg by mouth daily with breakfast.   Yes [provider]  levothyroxine  (SYNTHROID ) 50 MCG tablet Take 1 tablet (50 mcg total) by mouth daily. 09/26/23  Yes Glendia Shad, MD  rosuvastatin  (CRESTOR ) 5 MG tablet Take 1 tablet (5 mg total) by mouth daily. 08/22/23  Yes Glendia Shad, MD    Allergies as of 11/18/2023 - Review Complete 08/21/2023  Allergen Reaction Noted   Ace inhibitors Swelling 09/01/2022   Aspirin Swelling 02/22/2021    Family History  Problem Relation Age of Onset   Tuberculosis Paternal Grandfather    Alzheimer's disease Mother    Goiter Mother    Cancer Brother    Cancer Brother    Cancer Brother    Cancer Brother     Social History   Socioeconomic History   Marital status: Married    Spouse name: Not on file   Number of children: Not on file   Years of education: Not on file   Highest education level: Not on file  Occupational History   Not on file  Tobacco Use   Smoking status: Former  Current packs/day: 0.00    Types: Cigarettes    Quit date: 04/16/2009    Years since quitting: 14.6   Smokeless tobacco: Never  Vaping Use   Vaping status: Never Used  Substance and Sexual Activity   Alcohol use: Yes    Alcohol/week: 3.0 - 4.0 standard drinks of alcohol    Types: 3 - 4 Standard drinks or equivalent per week    Comment: rare   Drug use: Yes    Types: Marijuana    Comment: marijuana every day   Sexual activity: Not on file  Other Topics Concern   Not on file  Social History Narrative   Not on file   Social Drivers of Health   Financial Resource Strain: Low Risk  (10/23/2023)   Received from [facility]   Overall Financial  Resource Strain (CARDIA)    Difficulty of Paying Living Expenses: Not hard at all  Food Insecurity: No Food Insecurity (10/23/2023)   Received from [facility]   Hunger Vital Sign    Within the past 12 months, you worried that your food would run out before you got the money to buy more.: Never true    Within the past 12 months, the food you bought just didn't last and you didn't have money to get more.: Never true  Transportation Needs: No Transportation Needs (10/23/2023)   Received from [facility] - Transportation    In the past 12 months, has lack of transportation kept you from medical appointments or from getting medications?: No    Lack of Transportation (Non-Medical): No  Physical Activity: Insufficiently Active (05/25/2022)   Exercise Vital Sign    Days of Exercise per Week: 3 days    Minutes of Exercise per Session: 30 min  Stress: No Stress Concern Present (05/25/2022)   [employer] of Occupational Health - Occupational Stress Questionnaire    Feeling of Stress : Not at all  Social Connections: Unknown (05/25/2022)   Social Connection and Isolation Panel    Frequency of Communication with Friends and Family: More than three times a week    Frequency of Social Gatherings with Friends and Family: More than three times a week    Attends Religious Services: Not on file    Active Member of Clubs or Organizations: Not on file    Attends Banker Meetings: Not on file    Marital Status: Not on file  Intimate Partner Violence: Not At Risk (01/03/2023)   Humiliation, Afraid, Rape, and Kick questionnaire    Fear of Current or Ex-Partner: No    Emotionally Abused: No    Physically Abused: No    Sexually Abused: No    Review of Systems: See HPI, otherwise negative ROS  Physical Exam: Ht 5' 3 (1.6 m)   Wt 52.2 kg   BMI 20.37 kg/m  General:   Alert, cooperative in NAD Head:  Normocephalic and atraumatic. Respiratory:   Normal work of breathing. Cardiovascular:  RRR  Impression/Plan: Jessica Wall is here for cataract surgery.  Risks, benefits, limitations, and alternatives regarding cataract surgery have been reviewed with the patient.  Questions have been answered.  All parties agreeable.   Feliciano Bryan Ober, MD  12/05/2023, 7:10 AM

## 2023-12-05 NOTE — Transfer of Care (Signed)
 Immediate Anesthesia Transfer of Care Note  Patient: Jessica Wall  Procedure(s) Performed: PHACOEMULSIFICATION, CATARACT, WITH IOL INSERTION 22.49 01:56.2 (Left: Eye)  Patient Location: PACU  Anesthesia Type: MAC  Level of Consciousness: awake, alert  and patient cooperative  Airway and Oxygen Therapy: Patient Spontanous Breathing and Patient connected to supplemental oxygen  Post-op Assessment: Post-op Vital signs reviewed, Patient's Cardiovascular Status Stable, Respiratory Function Stable, Patent Airway and No signs of Nausea or vomiting  Post-op Vital Signs: Reviewed and stable  Complications: No notable events documented.

## 2023-12-05 NOTE — Anesthesia Postprocedure Evaluation (Signed)
 Anesthesia Post Note  Patient: Jessica Wall  Procedure(s) Performed: PHACOEMULSIFICATION, CATARACT, WITH IOL INSERTION 22.49 01:56.2 (Left: Eye)  Patient location during evaluation: PACU Anesthesia Type: MAC Level of consciousness: awake and alert Pain management: pain level controlled Vital Signs Assessment: post-procedure vital signs reviewed and stable Respiratory status: spontaneous breathing, nonlabored ventilation, respiratory function stable and patient connected to nasal cannula oxygen Cardiovascular status: stable and blood pressure returned to baseline Postop Assessment: no apparent nausea or vomiting Anesthetic complications: no   No notable events documented.   Last Vitals:  Vitals:   12/05/23 1134 12/05/23 1139  BP: (!) 126/57 (!) 125/53  Pulse: (!) 54   Resp: 20   Temp: 36.6 C (!) 36.4 C  SpO2: 98%     Last Pain:  Vitals:   12/05/23 1139  TempSrc:   PainSc: 0-No pain                 Modine Oppenheimer C Hisashi Amadon

## 2023-12-06 ENCOUNTER — Encounter: Payer: Self-pay | Admitting: Ophthalmology

## 2023-12-06 DIAGNOSIS — H2511 Age-related nuclear cataract, right eye: Secondary | ICD-10-CM | POA: Diagnosis not present

## 2023-12-18 NOTE — Discharge Instructions (Signed)

## 2023-12-19 ENCOUNTER — Ambulatory Visit: Payer: Self-pay | Admitting: General Practice

## 2023-12-19 ENCOUNTER — Encounter: Payer: Self-pay | Admitting: Ophthalmology

## 2023-12-19 ENCOUNTER — Ambulatory Visit
Admission: RE | Admit: 2023-12-19 | Discharge: 2023-12-19 | Disposition: A | Discharge status: Home / Self Care | Attending: Ophthalmology | Admitting: Ophthalmology

## 2023-12-19 ENCOUNTER — Other Ambulatory Visit: Payer: Self-pay

## 2023-12-19 ENCOUNTER — Encounter
Admission: RE | Disposition: A | Discharge status: Home / Self Care | Payer: Self-pay | Source: Home / Self Care | Attending: Ophthalmology

## 2023-12-19 DIAGNOSIS — E1136 Type 2 diabetes mellitus with diabetic cataract: Secondary | ICD-10-CM | POA: Diagnosis not present

## 2023-12-19 DIAGNOSIS — H2511 Age-related nuclear cataract, right eye: Secondary | ICD-10-CM | POA: Diagnosis not present

## 2023-12-19 DIAGNOSIS — Z87891 Personal history of nicotine dependence: Secondary | ICD-10-CM | POA: Insufficient documentation

## 2023-12-19 DIAGNOSIS — I10 Essential (primary) hypertension: Secondary | ICD-10-CM | POA: Diagnosis not present

## 2023-12-19 DIAGNOSIS — E890 Postprocedural hypothyroidism: Secondary | ICD-10-CM | POA: Diagnosis not present

## 2023-12-19 DIAGNOSIS — Z79890 Hormone replacement therapy (postmenopausal): Secondary | ICD-10-CM | POA: Diagnosis not present

## 2023-12-19 DIAGNOSIS — H2589 Other age-related cataract: Secondary | ICD-10-CM | POA: Diagnosis present

## 2023-12-19 HISTORY — PX: CATARACT EXTRACTION W/PHACO: SHX586

## 2023-12-19 SURGERY — PHACOEMULSIFICATION, CATARACT, WITH IOL INSERTION
Anesthesia: Monitor Anesthesia Care | Site: Eye | Laterality: Right

## 2023-12-19 MED ORDER — MIDAZOLAM HCL 2 MG/2ML IJ SOLN
INTRAMUSCULAR | Status: AC
  Filled 2023-12-19: qty 2

## 2023-12-19 MED ORDER — FENTANYL CITRATE (PF) 100 MCG/2ML IJ SOLN
INTRAMUSCULAR | Status: AC
  Filled 2023-12-19: qty 2

## 2023-12-19 MED ORDER — BRIMONIDINE TARTRATE-TIMOLOL 0.2-0.5 % OP SOLN
OPHTHALMIC | Status: DC | PRN
  Administered 2023-12-19: 1 [drp] via OPHTHALMIC

## 2023-12-19 MED ORDER — LACTATED RINGERS IV SOLN: INTRAVENOUS | Status: DC

## 2023-12-19 MED ORDER — PHENYLEPHRINE HCL 10 % OP SOLN
1.0000 [drp] | OPHTHALMIC | Status: AC
  Administered 2023-12-19 (×3): 1 [drp] via OPHTHALMIC

## 2023-12-19 MED ORDER — PHENYLEPHRINE HCL 10 % OP SOLN
OPHTHALMIC | Status: AC
Start: 2023-12-19 — End: 2023-12-19
  Filled 2023-12-19: qty 5

## 2023-12-19 MED ORDER — TETRACAINE HCL 0.5 % OP SOLN
1.0000 [drp] | OPHTHALMIC | Status: AC
  Administered 2023-12-19 (×3): 1 [drp] via OPHTHALMIC

## 2023-12-19 MED ORDER — LIDOCAINE HCL (PF) 2 % IJ SOLN
INTRAOCULAR | Status: DC | PRN
  Administered 2023-12-19: 4 mL via INTRAOCULAR

## 2023-12-19 MED ORDER — CYCLOPENTOLATE HCL 2 % OP SOLN
OPHTHALMIC | Status: AC
Start: 2023-12-19 — End: 2023-12-19
  Filled 2023-12-19: qty 2

## 2023-12-19 MED ORDER — MIDAZOLAM HCL 2 MG/2ML IJ SOLN
INTRAMUSCULAR | Status: DC | PRN
  Administered 2023-12-19 (×2): 1 mg via INTRAVENOUS

## 2023-12-19 MED ORDER — CYCLOPENTOLATE HCL 2 % OP SOLN
1.0000 [drp] | OPHTHALMIC | Status: AC
  Administered 2023-12-19 (×3): 1 [drp] via OPHTHALMIC

## 2023-12-19 MED ORDER — PHENYLEPHRINE-KETOROLAC 1-0.3 % IO SOLN
INTRAOCULAR | Status: DC | PRN
  Administered 2023-12-19: 107 mL via OPHTHALMIC

## 2023-12-19 MED ORDER — SIGHTPATH DOSE#1 NA HYALUR & NA CHOND-NA HYALUR IO KIT
PACK | INTRAOCULAR | Status: DC | PRN
  Administered 2023-12-19: 1

## 2023-12-19 MED ORDER — TETRACAINE HCL 0.5 % OP SOLN
OPHTHALMIC | Status: AC
  Filled 2023-12-19: qty 4

## 2023-12-19 MED ORDER — MOXIFLOXACIN HCL 0.5 % OP SOLN
OPHTHALMIC | Status: DC | PRN
  Administered 2023-12-19: .2 mL via OPHTHALMIC

## 2023-12-19 MED ORDER — FENTANYL CITRATE (PF) 100 MCG/2ML IJ SOLN
INTRAMUSCULAR | Status: DC | PRN
  Administered 2023-12-19 (×2): 50 ug via INTRAVENOUS

## 2023-12-19 MED ORDER — SIGHTPATH DOSE#1 BSS IO SOLN
INTRAOCULAR | Status: DC | PRN
  Administered 2023-12-19: 15 mL via INTRAOCULAR

## 2023-12-19 SURGICAL SUPPLY — 12 items
CANNULA ANT/CHMB 27G (MISCELLANEOUS) IMPLANT
DISSECTOR HYDRO NUCLEUS 50X22 (MISCELLANEOUS) ×1 IMPLANT
DRSG TEGADERM 2-3/8X2-3/4 SM (GAUZE/BANDAGES/DRESSINGS) ×1 IMPLANT
FEE CATARACT SUITE SIGHTPATH (MISCELLANEOUS) ×1 IMPLANT
GLOVE BIOGEL PI IND STRL 8 (GLOVE) ×1 IMPLANT
GLOVE SURG LX STRL 7.5 STRW (GLOVE) ×1 IMPLANT
GLOVE SURG SYN 6.5 PF PI BL (GLOVE) ×1 IMPLANT
LENS IOL TECNIS MONO 21.0 (Intraocular Lens) IMPLANT
NDL FILTER BLUNT 18X1 1/2 (NEEDLE) ×1 IMPLANT
NEEDLE FILTER BLUNT 18X1 1/2 (NEEDLE) ×1 IMPLANT
RING MALYGIN (MISCELLANEOUS) IMPLANT
SYR 3ML LL SCALE MARK (SYRINGE) ×1 IMPLANT

## 2023-12-19 NOTE — Transfer of Care (Signed)
 Immediate Anesthesia Transfer of Care Note  Patient: Jessica Wall  Procedure(s) Performed: PHACOEMULSIFICATION, CATARACT, WITH IOL INSERTION 25.65 02:13.9 (Right: Eye)  Patient Location: PACU  Anesthesia Type: MAC  Level of Consciousness: awake, alert  and patient cooperative  Airway and Oxygen Therapy: Patient Spontanous Breathing and Patient connected to supplemental oxygen  Post-op Assessment: Post-op Vital signs reviewed, Patient's Cardiovascular Status Stable, Respiratory Function Stable, Patent Airway and No signs of Nausea or vomiting  Post-op Vital Signs: Reviewed and stable  Complications: No notable events documented.

## 2023-12-19 NOTE — H&P (Signed)
 [facility]   Primary Care Physician:  Glendia Shad, MD Ophthalmologist: Dr. Feliciano Ober  Pre-Procedure History & Physical: HPI:  Jessica Wall is a 78 y.o. female here for cataract surgery.   Past Medical History:  Diagnosis Date   Anemia    Angioedema due to angiotensin converting enzyme inhibitor (ACE-I)    Avascular necrosis (HCC)    Decreased GFR    DM (diabetes mellitus), type 2 (HCC)    no meds   Esophagitis    Goiter    s/p thyroidectomy   History of colonic polyps    Hypercholesterolemia    Hypertension    no current medications   Hypothyroidism    Marijuana use    Recurrent sinus infections    and allergy problems   Syncopal episodes    last one 12-16-22   Temporal arteritis (HCC)    Tobacco use    Type 2 diabetes mellitus (HCC)    Uterine fibroid     Past Surgical History:  Procedure Laterality Date   ARTERY BIOPSY Right 03/13/2021   Procedure: BIOPSY TEMPORAL ARTERY;  Surgeon: Dessa Reyes ORN, MD;  Location: [facility] ORS;  Service: General;  Laterality: Right;   BREAST BIOPSY     CATARACT EXTRACTION W/PHACO Left 12/05/2023   Procedure: PHACOEMULSIFICATION, CATARACT, WITH IOL INSERTION 22.49 01:56.2;  Surgeon: Ober Feliciano Hugger, MD;  Location: [facility];  Service: Ophthalmology;  Laterality: Left;   COLONOSCOPY WITH PROPOFOL  N/A 11/03/2014   Procedure: COLONOSCOPY WITH PROPOFOL ;  Surgeon: Lamar ONEIDA Holmes, MD;  Location: [facility] END[facility]COPY;  Service: Endoscopy;  Laterality: N/A;   COLONOSCOPY WITH PROPOFOL  N/A 02/12/2018   Procedure: COLONOSCOPY WITH PROPOFOL ;  Surgeon: Holmes Lamar ONEIDA, MD;  Location: [facility] ENDOSCOPY;  Service: Endoscopy;  Laterality: N/A;   DILATION AND CURETTAGE OF UTERUS  1980 & 1993   ESOPHAGOGASTRODUODENOSCOPY N/A 11/03/2014   Procedure: ESOPHAGOGASTRODUODENOSCOPY (EGD);  Surgeon: Lamar ONEIDA Holmes, MD;  Location: [facility] ENDOSCOPY;  Service: Endoscopy;  Laterality: N/A;   LAPAROSCOPIC APPENDECTOMY N/A 11/26/2014    Procedure: APPENDECTOMY LAPAROSCOPIC;  Surgeon: Reyes ORN Dessa, MD;  Location: [facility] ORS;  Service: General;  Laterality: N/A;   thyroidectomy  1979   goiter   TOTAL HIP ARTHROPLASTY Left 01/03/2023   Procedure: TOTAL HIP ARTHROPLASTY ANTERIOR APPROACH -RNFA;  Surgeon: Lorelle Hussar, MD;  Location: [facility] ORS;  Service: Orthopedics;  Laterality: Left;   TUBAL LIGATION  1975    Prior to Admission medications   Medication Sig Start Date End Date Taking? Authorizing Provider  ferrous sulfate  325 (65 FE) MG tablet Take 50 mg by mouth daily with breakfast.    [provider]  levothyroxine  (SYNTHROID ) 50 MCG tablet Take 1 tablet (50 mcg total) by mouth daily. 09/26/23   Glendia Shad, MD  rosuvastatin  (CRESTOR ) 5 MG tablet Take 1 tablet (5 mg total) by mouth daily. 08/22/23   Glendia Shad, MD    Allergies as of 11/18/2023 - Review Complete 08/21/2023  Allergen Reaction Noted   Ace inhibitors Swelling 09/01/2022   Aspirin Swelling 02/22/2021    Family History  Problem Relation Age of Onset   Tuberculosis Paternal Grandfather    Alzheimer's disease Mother    Goiter Mother    Cancer Brother    Cancer Brother    Cancer Brother    Cancer Brother     Social History   Socioeconomic History   Marital status: Married    Spouse name: Not on file   Number of children: Not on file  Years of education: Not on file   Highest education level: Not on file  Occupational History   Not on file  Tobacco Use   Smoking status: Former    Current packs/day: 0.00    Types: Cigarettes    Quit date: 04/16/2009    Years since quitting: 14.6   Smokeless tobacco: Never  Vaping Use   Vaping status: Never Used  Substance and Sexual Activity   Alcohol use: Yes    Alcohol/week: 3.0 - 4.0 standard drinks of alcohol    Types: 3 - 4 Standard drinks or equivalent per week    Comment: rare   Drug use: Yes    Types: Marijuana    Comment: marijuana every day   Sexual activity: Not on file   Other Topics Concern   Not on file  Social History Narrative   Not on file   Social Drivers of Health   Financial Resource Strain: Low Risk  (10/23/2023)   Received from [facility]   Overall Financial Resource Strain (CARDIA)    Difficulty of Paying Living Expenses: Not hard at all  Food Insecurity: No Food Insecurity (10/23/2023)   Received from [facility]   Hunger Vital Sign    Within the past 12 months, you worried that your food would run out before you got the money to buy more.: Never true    Within the past 12 months, the food you bought just didn't last and you didn't have money to get more.: Never true  Transportation Needs: No Transportation Needs (10/23/2023)   Received from [facility] - Transportation    In the past 12 months, has lack of transportation kept you from medical appointments or from getting medications?: No    Lack of Transportation (Non-Medical): No  Physical Activity: Insufficiently Active (05/25/2022)   Exercise Vital Sign    Days of Exercise per Week: 3 days    Minutes of Exercise per Session: 30 min  Stress: No Stress Concern Present (05/25/2022)   [employer] of Occupational Health - Occupational Stress Questionnaire    Feeling of Stress : Not at all  Social Connections: Unknown (05/25/2022)   Social Connection and Isolation Panel    Frequency of Communication with Friends and Family: More than three times a week    Frequency of Social Gatherings with Friends and Family: More than three times a week    Attends Religious Services: Not on file    Active Member of Clubs or Organizations: Not on file    Attends Banker Meetings: Not on file    Marital Status: Not on file  Intimate Partner Violence: Not At Risk (01/03/2023)   Humiliation, Afraid, Rape, and Kick questionnaire    Fear of Current or Ex-Partner: No    Emotionally Abused: No    Physically Abused: No    Sexually  Abused: No    Review of Systems: See HPI, otherwise negative ROS  Physical Exam: There were no vitals taken for this visit. General:   Alert, cooperative in NAD Head:  Normocephalic and atraumatic. Respiratory:  Normal work of breathing. Cardiovascular:  RRR  Impression/Plan: Jessica Wall is here for cataract surgery.  Risks, benefits, limitations, and alternatives regarding cataract surgery have been reviewed with the patient.  Questions have been answered.  All parties agreeable.   Feliciano Bryan Ober, MD  12/19/2023, 7:12 AM

## 2023-12-19 NOTE — Anesthesia Preprocedure Evaluation (Addendum)
 Anesthesia Evaluation  Patient identified by MRN, date of birth, ID band Patient awake    Reviewed: Allergy & Precautions, H&P , NPO status , Patient's Chart, lab work & pertinent test results  Airway Mallampati: III  TM Distance: >3 FB Neck ROM: Full    Dental   Missing left upper premolar:   Pulmonary Patient abstained from smoking., former smoker   Pulmonary exam normal breath sounds clear to auscultation       Cardiovascular hypertension, Normal cardiovascular exam Rhythm:Regular Rate:Normal     Neuro/Psych  Headaches negative neurological ROS  negative psych ROS   GI/Hepatic negative GI ROS, Neg liver ROS,,,  Endo/Other  diabetesHypothyroidism    Renal/GU negative Renal ROS  negative genitourinary   Musculoskeletal negative musculoskeletal ROS (+)    Abdominal   Peds negative pediatric ROS (+)  Hematology negative hematology ROS (+) Blood dyscrasia, anemia   Anesthesia Other Findings Previous cataract surgery 12-05-23 Dr. Ola previous surgery versed  2 mg IV and fenanyl 100 mcg IV Note marijuana use: may need more than usual med to achieve mild sedation  Goiter Hypothyroidism Tobacco use Esophagitis  Decreased GFR Angioedema due to angiotensin converting enzyme inhibitor (ACE-I)  History of colonic polyps Anemia \ Avascular necrosis (HCC) Hypertension  Temporal arteritis (HCC) Syncopal episodes  Marijuana use  Type 2 diabetes mellitus (HCC)    Reproductive/Obstetrics negative OB ROS                              Anesthesia Physical Anesthesia Plan  ASA: 2  Anesthesia Plan: MAC   Post-op Pain Management:    Induction: Intravenous  PONV Risk Score and Plan:   Airway Management Planned: Natural Airway and Nasal Cannula  Additional Equipment:   Intra-op Plan:   Post-operative Plan:   Informed Consent: I have reviewed the patients History and Physical,  chart, labs and discussed the procedure including the risks, benefits and alternatives for the proposed anesthesia with the patient or authorized representative who has indicated his/her understanding and acceptance.     Dental Advisory Given  Plan Discussed with: Anesthesiologist, CRNA and Surgeon  Anesthesia Plan Comments: (Patient consented for risks of anesthesia including but not limited to:  - adverse reactions to medications - damage to eyes, teeth, lips or other oral mucosa - nerve damage due to positioning  - sore throat or hoarseness - Damage to heart, brain, nerves, lungs, other parts of body or loss of life  Patient voiced understanding and assent.)         Anesthesia Quick Evaluation

## 2023-12-19 NOTE — Op Note (Signed)
 OPERATIVE NOTE  Jessica Wall [id number] 12/19/2023   PREOPERATIVE DIAGNOSIS: Nuclear sclerotic cataract right eye. H25.11   POSTOPERATIVE DIAGNOSIS: Nuclear sclerotic cataract right eye. H25.11   PROCEDURE:  Phacoemulsification with posterior chamber intraocular lens placement of the right eye  Ultrasound time: Procedure(s): PHACOEMULSIFICATION, CATARACT, WITH IOL INSERTION 25.65 02:13.9 (Right)  LENS:   Implant Name Type Inv. Item Serial No. Manufacturer Lot No. LRB No. Used Action  LENS IOL TECNIS MONO 21.0 - [id number] Intraocular Lens LENS IOL TECNIS MONO 21.0 [id number] [employer]  Right 1 Implanted      SURGEON:  Feliciano HERO. Enola, MD   ANESTHESIA:  Topical with tetracaine  drops, augmented with 1% preservative-free intracameral lidocaine .   COMPLICATIONS:  None.   DESCRIPTION OF PROCEDURE:  The patient was identified in the holding room and transported to the operating room and placed in the supine position under the operating microscope.  The right eye was identified as the operative eye, which was prepped and draped in the usual sterile ophthalmic fashion.   A 1 millimeter clear-corneal paracentesis was made superotemporally. Preservative-free 1% lidocaine  mixed with 1:1,000 bisulfite-free aqueous solution of epinephrine  was injected into the anterior chamber. The anterior chamber was then filled with Viscoat viscoelastic. A 2.4 millimeter keratome was used to make a clear-corneal incision inferotemporally. A curvilinear capsulorrhexis was made with a cystotome and capsulorrhexis forceps. Balanced salt  solution was used to hydrodissect and hydrodelineate the nucleus. Phacoemulsification was then used to remove the lens nucleus and epinucleus. The remaining cortex was then removed using the irrigation and aspiration handpiece. Provisc was then placed into the capsular bag to distend it for lens placement. A +21.00 D [id number] intraocular lens was then injected into the capsular bag. The  remaining viscoelastic was aspirated.   Wounds were hydrated with balanced salt  solution.  The anterior chamber was inflated to a physiologic pressure with balanced salt  solution.  No wound leaks were noted. Moxifloxacin  was injected intracamerally.  Timolol  and Brimonidine  drops were applied to the eye.  The patient was taken to the recovery room in stable condition without complications of anesthesia or surgery.  [employer] 12/19/2023, 10:06 AM

## 2023-12-19 NOTE — Anesthesia Postprocedure Evaluation (Signed)
 Anesthesia Post Note  Patient: Jessica Wall  Procedure(s) Performed: PHACOEMULSIFICATION, CATARACT, WITH IOL INSERTION 25.65 02:13.9 (Right: Eye)  Patient location during evaluation: PACU Anesthesia Type: MAC Level of consciousness: awake and alert Pain management: pain level controlled Vital Signs Assessment: post-procedure vital signs reviewed and stable Respiratory status: spontaneous breathing, nonlabored ventilation, respiratory function stable and patient connected to nasal cannula oxygen Cardiovascular status: stable and blood pressure returned to baseline Postop Assessment: no apparent nausea or vomiting Anesthetic complications: no   No notable events documented.   Last Vitals:  Vitals:   12/19/23 1007 12/19/23 1013  BP: (!) 112/55 (!) 111/58  Pulse: 60 (!) 53  Resp: 14 11  Temp: (!) 36.1 C (!) 36.1 C  SpO2: 96% 98%    Last Pain:  Vitals:   12/19/23 1013  TempSrc:   PainSc: 0-No pain                 [name] C Nel Stoneking

## 2023-12-20 ENCOUNTER — Encounter: Payer: Self-pay | Admitting: Ophthalmology

## 2023-12-20 DIAGNOSIS — Z008 Encounter for other general examination: Secondary | ICD-10-CM | POA: Diagnosis not present

## 2023-12-20 DIAGNOSIS — M316 Other giant cell arteritis: Secondary | ICD-10-CM | POA: Diagnosis not present

## 2024-01-01 ENCOUNTER — Encounter: Payer: Self-pay | Admitting: Internal Medicine

## 2024-01-01 ENCOUNTER — Ambulatory Visit (INDEPENDENT_AMBULATORY_CARE_PROVIDER_SITE_OTHER): Admitting: Internal Medicine

## 2024-01-01 VITALS — BP 116/72 | HR 83 | Resp 16 | Ht 63.0 in | Wt 119.8 lb

## 2024-01-01 DIAGNOSIS — E039 Hypothyroidism, unspecified: Secondary | ICD-10-CM

## 2024-01-01 DIAGNOSIS — E1165 Type 2 diabetes mellitus with hyperglycemia: Secondary | ICD-10-CM | POA: Diagnosis not present

## 2024-01-01 DIAGNOSIS — F439 Reaction to severe stress, unspecified: Secondary | ICD-10-CM

## 2024-01-01 DIAGNOSIS — Z860100 Personal history of colon polyps, unspecified: Secondary | ICD-10-CM | POA: Diagnosis not present

## 2024-01-01 DIAGNOSIS — I10 Essential (primary) hypertension: Secondary | ICD-10-CM | POA: Diagnosis not present

## 2024-01-01 DIAGNOSIS — E7800 Pure hypercholesterolemia, unspecified: Secondary | ICD-10-CM

## 2024-01-01 DIAGNOSIS — Z96642 Presence of left artificial hip joint: Secondary | ICD-10-CM | POA: Diagnosis not present

## 2024-01-01 DIAGNOSIS — D509 Iron deficiency anemia, unspecified: Secondary | ICD-10-CM | POA: Diagnosis not present

## 2024-01-01 LAB — CBC WITH DIFFERENTIAL/PLATELET
Basophils Absolute: 0 K/uL (ref 0.0–0.1)
Basophils Relative: 0.6 % (ref 0.0–3.0)
Eosinophils Absolute: 0.1 K/uL (ref 0.0–0.7)
Eosinophils Relative: 1.3 % (ref 0.0–5.0)
HCT: 36.2 % (ref 36.0–46.0)
Hemoglobin: 11.9 g/dL — ABNORMAL LOW (ref 12.0–15.0)
Lymphocytes Relative: 45.8 % (ref 12.0–46.0)
Lymphs Abs: 2.4 K/uL (ref 0.7–4.0)
MCHC: 33 g/dL (ref 30.0–36.0)
MCV: 93.8 fl (ref 78.0–100.0)
Monocytes Absolute: 0.3 K/uL (ref 0.1–1.0)
Monocytes Relative: 4.8 % (ref 3.0–12.0)
Neutro Abs: 2.5 K/uL (ref 1.4–7.7)
Neutrophils Relative %: 47.5 % (ref 43.0–77.0)
Platelets: 279 K/uL (ref 150.0–400.0)
RBC: 3.86 Mil/uL — ABNORMAL LOW (ref 3.87–5.11)
RDW: 15.7 % — ABNORMAL HIGH (ref 11.5–15.5)
WBC: 5.2 K/uL (ref 4.0–10.5)

## 2024-01-01 LAB — LIPID PANEL
Cholesterol: 154 mg/dL (ref 0–200)
HDL: 73.6 mg/dL (ref >39.00)
LDL Cholesterol: 67 mg/dL (ref 0–99)
NonHDL: 80.16
Total CHOL/HDL Ratio: 2
Triglycerides: 68 mg/dL (ref 0.0–149.0)
VLDL: 13.6 mg/dL (ref 0.0–40.0)

## 2024-01-01 LAB — HEPATIC FUNCTION PANEL
ALT: 12 U/L (ref 0–35)
AST: 20 U/L (ref 0–37)
Albumin: 4 g/dL (ref 3.5–5.2)
Alkaline Phosphatase: 91 U/L (ref 39–117)
Bilirubin, Direct: 0.1 mg/dL (ref 0.0–0.3)
Total Bilirubin: 0.3 mg/dL (ref 0.2–1.2)
Total Protein: 7.3 g/dL (ref 6.0–8.3)

## 2024-01-01 LAB — MICROALBUMIN / CREATININE URINE RATIO
Creatinine,U: 98.6 mg/dL
Microalb Creat Ratio: 13.9 mg/g (ref 0.0–30.0)
Microalb, Ur: 1.4 mg/dL (ref 0.0–1.9)

## 2024-01-01 LAB — IBC + FERRITIN
Ferritin: 244.1 ng/mL (ref 10.0–291.0)
Iron: 37 ug/dL — ABNORMAL LOW (ref 42–145)
Saturation Ratios: 14.8 % — ABNORMAL LOW (ref 20.0–50.0)
TIBC: 249.2 ug/dL — ABNORMAL LOW (ref 250.0–450.0)
Transferrin: 178 mg/dL — ABNORMAL LOW (ref 212.0–360.0)

## 2024-01-01 LAB — BASIC METABOLIC PANEL WITH GFR
BUN: 13 mg/dL (ref 6–23)
CO2: 28 meq/L (ref 19–32)
Calcium: 9.3 mg/dL (ref 8.4–10.5)
Chloride: 106 meq/L (ref 96–112)
Creatinine, Ser: 0.89 mg/dL (ref 0.40–1.20)
GFR: 62.94 mL/min (ref >60.00)
Glucose, Bld: 99 mg/dL (ref 70–99)
Potassium: 4.3 meq/L (ref 3.5–5.1)
Sodium: 140 meq/L (ref 135–145)

## 2024-01-01 LAB — TSH: TSH: 3.52 u[IU]/mL (ref 0.35–5.50)

## 2024-01-01 LAB — HEMOGLOBIN A1C: Hgb A1c MFr Bld: 6.4 % (ref 4.6–6.5)

## 2024-01-01 MED ORDER — LEVOTHYROXINE SODIUM 50 MCG PO TABS: 50.0000 ug | ORAL_TABLET | Freq: Every day | ORAL | 1 refills | Status: DC

## 2024-01-01 NOTE — Assessment & Plan Note (Signed)
 Had f/u with Dr Lorelle 10/23/23 - stable. F/u one year. . Is s/p left anterior total replacement (hip). S/p PT. Doing well.

## 2024-01-01 NOTE — Progress Notes (Signed)
 Subjective:    [name], female    DOB: Mar 30, 1946, 78 y.o.   MRN: [id number]  Patient here for  Chief Complaint  Patient presents with   Medical Management of Chronic Issues    HPI Here for a scheduled follow up - follow up regarding anemia, hypothyroidism and hypertension. Had f/u with hematology 07/03/23 - f/u anemia. Recommended to monitor her anemia conservatively and if remains between 10-11 - no need for any further investigations. Had f/u with Dr Lorelle 10/23/23 - stable. F/u one year. . Is s/p left anterior total replacement (hip). S/p PT. Doing well. Eating. Appetite doing well. Has gained some weight. No chest pain or sob. No abdominal pain or bowel change. Discussed F/u MRA? She wants to hold at this time. Will notify me when agreeable.    Past Medical History:  Diagnosis Date   Anemia    Angioedema due to angiotensin converting enzyme inhibitor (ACE-I)    Avascular necrosis (HCC)    Decreased GFR    DM (diabetes mellitus), type 2 (HCC)    no meds   Esophagitis    Goiter    s/p thyroidectomy   History of colonic polyps    Hypercholesterolemia    Hypertension    no current medications   Hypothyroidism    Marijuana use    Recurrent sinus infections    and allergy problems   Syncopal episodes    last one 12-16-22   Temporal arteritis (HCC)    Tobacco use    Type 2 diabetes mellitus (HCC)    Uterine fibroid    Past Surgical History:  Procedure Laterality Date   ARTERY BIOPSY Right 03/13/2021   Procedure: BIOPSY TEMPORAL ARTERY;  Surgeon: Dessa Reyes ORN, MD;  Location: [facility];  Service: General;  Laterality: Right;   BREAST BIOPSY     CATARACT EXTRACTION W/PHACO Left 12/05/2023   Procedure: PHACOEMULSIFICATION, CATARACT, WITH IOL INSERTION 22.49 01:56.2;  Surgeon: Enola Feliciano Hugger, MD;  Location: [facility];  Service: Ophthalmology;  Laterality: Left;   CATARACT EXTRACTION W/PHACO Right 12/19/2023   Procedure:  PHACOEMULSIFICATION, CATARACT, WITH IOL INSERTION 25.65 02:13.9;  Surgeon: Enola Feliciano Hugger, MD;  Location: [facility];  Service: Ophthalmology;  Laterality: Right;   COLONOSCOPY WITH PROPOFOL  N/A 11/03/2014   Procedure: COLONOSCOPY WITH PROPOFOL ;  Surgeon: Lamar ONEIDA Holmes, MD;  Location: [facility];  Service: Endoscopy;  Laterality: N/A;   COLONOSCOPY WITH PROPOFOL  N/A 02/12/2018   Procedure: COLONOSCOPY WITH PROPOFOL ;  Surgeon: Holmes Lamar ONEIDA, MD;  Location: [facility];  Service: Endoscopy;  Laterality: N/A;   DILATION AND CURETTAGE OF UTERUS  1980 & 1993   ESOPHAGOGASTRODUODENOSCOPY N/A 11/03/2014   Procedure: ESOPHAGOGASTRODUODENOSCOPY (EGD);  Surgeon: Lamar ONEIDA Holmes, MD;  Location: [facility];  Service: Endoscopy;  Laterality: N/A;   LAPAROSCOPIC APPENDECTOMY N/A 11/26/2014   Procedure: APPENDECTOMY LAPAROSCOPIC;  Surgeon: Reyes ORN Dessa, MD;  Location: [facility];  Service: General;  Laterality: N/A;   thyroidectomy  1979   goiter   TOTAL HIP ARTHROPLASTY Left 01/03/2023   Procedure: TOTAL HIP ARTHROPLASTY ANTERIOR APPROACH -RNFA;  Surgeon: Lorelle Hussar, MD;  Location: [facility];  Service: Orthopedics;  Laterality: Left;   TUBAL LIGATION  1975   Family History  Problem Relation Age of Onset   Tuberculosis Paternal Grandfather    Alzheimer's disease Mother    Goiter Mother    Cancer Brother    Cancer Brother    Cancer Brother    Cancer Brother  Social History   Socioeconomic History   Marital status: Married    Spouse name: Not on file   Number of children: Not on file   Years of education: Not on file   Highest education level: Not on file  Occupational History   Not on file  Tobacco Use   Smoking status: Former    Current packs/day: 0.00    Types: Cigarettes    Quit date: 04/16/2009    Years since quitting: 14.7   Smokeless tobacco: Never  Vaping Use   Vaping status: Never Used  Substance and Sexual Activity   Alcohol use: Yes     Alcohol/week: 3.0 - 4.0 standard drinks of alcohol    Types: 3 - 4 Standard drinks or equivalent per week    Comment: rare   Drug use: Yes    Types: Marijuana    Comment: marijuana every day   Sexual activity: Not on file  Other Topics Concern   Not on file  Social History Narrative   Not on file   Social Drivers of Health   Financial Resource Strain: Low Risk  (10/23/2023)   Received from [facility]   Overall Financial Resource Strain (CARDIA)    Difficulty of Paying Living Expenses: Not hard at all  Food Insecurity: No Food Insecurity (10/23/2023)   Received from [facility]   Hunger Vital Sign    Within the past 12 months, you worried that your food would run out before you got the money to buy more.: Never true    Within the past 12 months, the food you bought just didn't last and you didn't have money to get more.: Never true  Transportation Needs: No Transportation Needs (10/23/2023)   Received from [facility] - Transportation    In the past 12 months, has lack of transportation kept you from medical appointments or from getting medications?: No    Lack of Transportation (Non-Medical): No  Physical Activity: Insufficiently Active (05/25/2022)   Exercise Vital Sign    Days of Exercise per Week: 3 days    Minutes of Exercise per Session: 30 min  Stress: No Stress Concern Present (05/25/2022)   [employer] of Occupational [employer] - Occupational Stress Questionnaire    Feeling of Stress : Not at all  Social Connections: Unknown (05/25/2022)   Social Connection and Isolation Panel    Frequency of Communication with Friends and Family: More than three times a week    Frequency of Social Gatherings with Friends and Family: More than three times a week    Attends Religious Services: Not on Marketing executive or Organizations: Not on file    Attends Banker Meetings: Not on file    Marital  Status: Not on file     Review of Systems  Constitutional:  Negative for appetite change and unexpected weight change.  HENT:  Negative for congestion and sinus pressure.   Respiratory:  Negative for cough, chest tightness and shortness of breath.   Cardiovascular:  Negative for chest pain, palpitations and leg swelling.  Gastrointestinal:  Negative for abdominal pain, diarrhea, nausea and vomiting.  Genitourinary:  Negative for difficulty urinating and dysuria.  Musculoskeletal:  Negative for joint swelling and myalgias.  Skin:  Negative for color change and rash.  Neurological:  Negative for dizziness and headaches.  Psychiatric/Behavioral:  Negative for agitation and dysphoric mood.  Objective:     BP 116/72   Pulse 83   Resp 16   Ht 5' 3 (1.6 m)   Wt 119 lb 12.8 oz (54.3 kg)   SpO2 98%   BMI 21.22 kg/m  Wt Readings from Last 3 Encounters:  01/01/24 119 lb 12.8 oz (54.3 kg)  12/19/23 119 lb (54 kg)  12/05/23 116 lb (52.6 kg)    Physical Exam Vitals reviewed.  Constitutional:      General: She is not in acute distress.    Appearance: Normal appearance.  HENT:     Head: Normocephalic and atraumatic.     Right Ear: External ear normal.     Left Ear: External ear normal.     Mouth/Throat:     Pharynx: No oropharyngeal exudate or posterior oropharyngeal erythema.  Eyes:     General: No scleral icterus.       Right eye: No discharge.        Left eye: No discharge.     Conjunctiva/sclera: Conjunctivae normal.  Neck:     Thyroid : No thyromegaly.  Cardiovascular:     Rate and Rhythm: Normal rate and regular rhythm.     Comments: Radial pulse palpable and appears to be equal bilateral upper extremities.  Pulmonary:     Effort: No respiratory distress.     Breath sounds: Normal breath sounds. No wheezing.  Abdominal:     General: Bowel sounds are normal.     Palpations: Abdomen is soft.     Tenderness: There is no abdominal tenderness.  Musculoskeletal:         General: No swelling or tenderness.     Cervical back: Neck supple. No tenderness.  Lymphadenopathy:     Cervical: No cervical adenopathy.  Skin:    Findings: No erythema or rash.  Neurological:     Mental Status: She is alert.  Psychiatric:        Mood and Affect: Mood normal.        Behavior: Behavior normal.         Outpatient Encounter Medications as of 01/01/2024  Medication Sig   ferrous sulfate  325 (65 FE) MG tablet Take 50 mg by mouth daily with breakfast.   levothyroxine  (SYNTHROID ) 50 MCG tablet Take 1 tablet (50 mcg total) by mouth daily.   rosuvastatin  (CRESTOR ) 5 MG tablet Take 1 tablet (5 mg total) by mouth daily.   [DISCONTINUED] levothyroxine  (SYNTHROID ) 50 MCG tablet Take 1 tablet (50 mcg total) by mouth daily.   No facility-administered encounter medications on file as of 01/01/2024.     Lab Results  Component Value Date   WBC 7.0 10/30/2023   HGB 11.9 (L) 10/30/2023   HCT 36.6 10/30/2023   PLT 290 10/30/2023   GLUCOSE 87 08/21/2023   CHOL 169 08/21/2023   TRIG 64.0 08/21/2023   HDL 68.70 08/21/2023   LDLCALC 88 08/21/2023   ALT 12 09/18/2023   AST 18 09/18/2023   NA 139 08/21/2023   K 4.0 08/21/2023   CL 103 08/21/2023   CREATININE 0.89 08/21/2023   BUN 11 08/21/2023   CO2 29 08/21/2023   TSH 3.40 08/21/2023   INR 1.2 06/07/2021   HGBA1C 6.1 08/21/2023    No results found.     Assessment & Plan:  Hypothyroidism, unspecified type Assessment & Plan: On synthroid . Follow tsh.   Orders: -     TSH  Hypercholesterolemia Assessment & Plan: Continue crestor .  Check lipid panel today.  Lab Results  Component Value Date   CHOL 169 08/21/2023   HDL 68.70 08/21/2023   LDLCALC 88 08/21/2023   TRIG 64.0 08/21/2023   CHOLHDL 2 08/21/2023    Orders: -     Lipid panel -     Hepatic function panel  Type 2 diabetes mellitus with hyperglycemia, without long-term current use of insulin (HCC) Assessment & Plan: Follow met b and A1c.   Off prednisone . Recheck A1c today. Also check urine microalbumin/cr ratio.   Orders: -     Basic metabolic panel with GFR -     Hemoglobin A1c -     Microalbumin / creatinine urine ratio  Stress Assessment & Plan: Overall appears to be doing well. Follow.    S/P total left hip arthroplasty Assessment & Plan: Had f/u with Dr Lorelle 10/23/23 - stable. F/u one year. . Is s/p left anterior total replacement (hip). S/p PT. Doing well.    Iron  deficiency anemia, unspecified iron  deficiency anemia type Assessment & Plan: Followed by hematology.  Previously received IV iron  infusions. Saw GI.  CT - no acute intraabdominal abnormality.  Deferred colonoscopy and EGD. Follow cbc and iron  studies. Dr Melanee recommended recheck in between appts with her. Check cbc and iron  studies today.   Orders: -     CBC with Differential/Platelet -     IBC + Ferritin  History of colonic polyps Assessment & Plan: Colonoscopy 09/2020 - recommended no f/u colonoscopy.     Essential hypertension, benign Assessment & Plan: Remain off blood pressure medication.  Blood pressure doing well. Follow.    Other orders -     Levothyroxine  Sodium; Take 1 tablet (50 mcg total) by mouth daily.  Dispense: 90 tablet; Refill: 1     Allena Hamilton, MD

## 2024-01-01 NOTE — Assessment & Plan Note (Signed)
 Overall appears to be doing well.  Follow.

## 2024-01-01 NOTE — Assessment & Plan Note (Signed)
 Remain off blood pressure medication.  Blood pressure doing well. Follow.

## 2024-01-01 NOTE — Assessment & Plan Note (Signed)
Colonoscopy 09/2020 - recommended no f/u colonoscopy.   

## 2024-01-01 NOTE — Assessment & Plan Note (Signed)
 Followed by hematology.  Previously received IV iron  infusions. Saw GI.  CT - no acute intraabdominal abnormality.  Deferred colonoscopy and EGD. Follow cbc and iron  studies. Dr Melanee recommended recheck in between appts with her. Check cbc and iron  studies today.

## 2024-01-01 NOTE — Assessment & Plan Note (Signed)
 Follow met b and A1c.  Off prednisone . Recheck A1c today. Also check urine microalbumin/cr ratio.

## 2024-01-01 NOTE — Assessment & Plan Note (Signed)
 Continue crestor .  Check lipid panel today.  Lab Results  Component Value Date   CHOL 169 08/21/2023   HDL 68.70 08/21/2023   LDLCALC 88 08/21/2023   TRIG 64.0 08/21/2023   CHOLHDL 2 08/21/2023

## 2024-01-01 NOTE — Assessment & Plan Note (Signed)
On synthroid.  Follow tsh.   

## 2024-01-07 ENCOUNTER — Ambulatory Visit: Payer: Self-pay | Admitting: Internal Medicine

## 2024-01-21 NOTE — Progress Notes (Signed)
 Jessica Wall                                          MRN: [id number]   01/21/2024   The [facility] Quality Team Specialist reviewed this patient medical record for the purposes of chart review for care gap closure. The following were reviewed: abstraction for care gap closure-kidney health evaluation for diabetes:eGFR  and uACR.    [facility] Quality Team

## 2024-02-05 DIAGNOSIS — N182 Chronic kidney disease, stage 2 (mild): Secondary | ICD-10-CM | POA: Diagnosis not present

## 2024-02-05 DIAGNOSIS — Z008 Encounter for other general examination: Secondary | ICD-10-CM | POA: Diagnosis not present

## 2024-02-10 ENCOUNTER — Ambulatory Visit: Admitting: *Deleted

## 2024-02-10 VITALS — Ht 63.0 in | Wt 119.0 lb

## 2024-02-10 DIAGNOSIS — Z0000 Encounter for general adult medical examination without abnormal findings: Secondary | ICD-10-CM

## 2024-02-10 NOTE — Progress Notes (Signed)
 Subjective:   Jessica Wall is a 78 y.o. who presents for a Medicare Wellness preventive visit.  As a reminder, Annual Wellness Visits don't include a physical exam, and some assessments may be limited, especially if this visit is performed virtually. We may recommend an in-person follow-up visit with your provider if needed.  Visit Complete: Virtual I connected with  Jessica Wall on 02/10/24 by a audio enabled telemedicine application and verified that I am speaking with the correct person using two identifiers.  Patient Location: Home  Provider Location: Home Office  I discussed the limitations of evaluation and management by telemedicine. The patient expressed understanding and agreed to proceed.  Vital Signs: Because this visit was a virtual/telehealth visit, some criteria may be missing or patient reported. Any vitals not documented were not able to be obtained and vitals that have been documented are patient reported.  VideoDeclined- This patient declined Librarian, academic. Therefore the visit was completed with audio only.  Persons Participating in Visit: Patient.  AWV Questionnaire: No: Patient Medicare AWV questionnaire was not completed prior to this visit.  Cardiac Risk Factors include: advanced age (>31men, >67 women);diabetes mellitus;dyslipidemia;hypertension;smoking/ tobacco exposure     Objective:    Today's Vitals   02/10/24 1315  Weight: 119 lb (54 kg)  Height: 5' 3 (1.6 m)   Body mass index is 21.08 kg/m.     02/10/2024    1:26 PM 12/19/2023    8:33 AM 12/05/2023    9:53 AM 07/03/2023   10:10 AM 01/03/2023   11:22 AM 01/01/2023   11:31 AM 12/27/2022   11:48 AM  Advanced Directives  Does Patient Have a Medical Advance Directive? No  Yes No No No No  Type of Special Educational Needs Teacher of [place];Living will Healthcare Power of [place];Living will   Healthcare Power of [place];Living will   Does patient want to  make changes to medical advance directive?  No - Patient declined No - Patient declined      Copy of Healthcare Power of Attorney in Chart?  Yes - validated most recent copy scanned in chart (See row information) No - copy requested      Would patient like information on creating a medical advance directive? No - Patient declined   No - Patient declined No - Patient declined No - Patient declined Yes (MAU/Ambulatory/Procedural Areas - Information given)    Current Medications (verified) Outpatient Encounter Medications as of 02/10/2024  Medication Sig   ferrous sulfate  325 (65 FE) MG tablet Take 50 mg by mouth daily with breakfast.   levothyroxine  (SYNTHROID ) 50 MCG tablet Take 1 tablet (50 mcg total) by mouth daily.   rosuvastatin  (CRESTOR ) 5 MG tablet Take 1 tablet (5 mg total) by mouth daily.   No facility-administered encounter medications on file as of 02/10/2024.    Allergies (verified) Ace inhibitors and Aspirin   History: Past Medical History:  Diagnosis Date   Anemia    Angioedema due to angiotensin converting enzyme inhibitor (ACE-I)    Avascular necrosis (HCC)    Decreased GFR    DM (diabetes mellitus), type 2 (HCC)    no meds   Esophagitis    Goiter    s/p thyroidectomy   History of colonic polyps    Hypercholesterolemia    Hypertension    no current medications   Hypothyroidism    Marijuana use    Recurrent sinus infections    and allergy problems   Syncopal  episodes    last one 12-16-22   Temporal arteritis (HCC)    Tobacco use    Type 2 diabetes mellitus (HCC)    Uterine fibroid    Past Surgical History:  Procedure Laterality Date   ARTERY BIOPSY Right 03/13/2021   Procedure: BIOPSY TEMPORAL ARTERY;  Surgeon: Dessa Reyes ORN, MD;  Location: [facility] ORS;  Service: General;  Laterality: Right;   BREAST BIOPSY     CATARACT EXTRACTION W/PHACO Left 12/05/2023   Procedure: PHACOEMULSIFICATION, CATARACT, WITH IOL INSERTION 22.49 01:56.2;  Surgeon: Enola Feliciano Hugger, MD;  Location: [facility];  Service: Ophthalmology;  Laterality: Left;   CATARACT EXTRACTION W/PHACO Right 12/19/2023   Procedure: PHACOEMULSIFICATION, CATARACT, WITH IOL INSERTION 25.65 02:13.9;  Surgeon: Enola Feliciano Hugger, MD;  Location: [facility];  Service: Ophthalmology;  Laterality: Right;   COLONOSCOPY WITH PROPOFOL  N/A 11/03/2014   Procedure: COLONOSCOPY WITH PROPOFOL ;  Surgeon: Lamar ONEIDA Holmes, MD;  Location: [facility];  Service: Endoscopy;  Laterality: N/A;   COLONOSCOPY WITH PROPOFOL  N/A 02/12/2018   Procedure: COLONOSCOPY WITH PROPOFOL ;  Surgeon: Holmes Lamar ONEIDA, MD;  Location: [facility] ENDOSCOPY;  Service: Endoscopy;  Laterality: N/A;   DILATION AND CURETTAGE OF UTERUS  1980 & 1993   ESOPHAGOGASTRODUODENOSCOPY N/A 11/03/2014   Procedure: ESOPHAGOGASTRODUODENOSCOPY (EGD);  Surgeon: Lamar ONEIDA Holmes, MD;  Location: [facility] ENDOSCOPY;  Service: Endoscopy;  Laterality: N/A;   LAPAROSCOPIC APPENDECTOMY N/A 11/26/2014   Procedure: APPENDECTOMY LAPAROSCOPIC;  Surgeon: Reyes ORN Dessa, MD;  Location: [facility] ORS;  Service: General;  Laterality: N/A;   thyroidectomy  1979   goiter   TOTAL HIP ARTHROPLASTY Left 01/03/2023   Procedure: TOTAL HIP ARTHROPLASTY ANTERIOR APPROACH -RNFA;  Surgeon: Lorelle Hussar, MD;  Location: [facility] ORS;  Service: Orthopedics;  Laterality: Left;   TUBAL LIGATION  1975   Family History  Problem Relation Age of Onset   Tuberculosis Paternal Grandfather    Alzheimer's disease Mother    Goiter Mother    Cancer Brother    Cancer Brother    Cancer Brother    Cancer Brother    Social History   Socioeconomic History   Marital status: Married    Spouse name: Not on file   Number of children: Not on file   Years of education: Not on file   Highest education level: Not on file  Occupational History   Not on file  Tobacco Use   Smoking status: Some Days    Types: E-cigarettes   Smokeless tobacco: Never  Vaping Use    Vaping status: Never Used  Substance and Sexual Activity   Alcohol use: Yes    Alcohol/week: 3.0 - 4.0 standard drinks of alcohol    Types: 3 - 4 Standard drinks or equivalent per week    Comment: rare   Drug use: Yes    Types: Marijuana    Comment: marijuana every day   Sexual activity: Not on file  Other Topics Concern   Not on file  Social History Narrative   married   Social Drivers of Corporate Investment Banker Strain: Low Risk  (02/10/2024)   Overall Financial Resource Strain (CARDIA)    Difficulty of Paying Living Expenses: Not hard at all  Food Insecurity: No Food Insecurity (02/10/2024)   Hunger Vital Sign    Worried About Running Out of Food in the Last Year: Never true    Ran Out of Food in the Last Year: Never true  Transportation Needs: No Transportation Needs (02/10/2024)  PRAPARE - Administrator, Civil Service (Medical): No    Lack of Transportation (Non-Medical): No  Physical Activity: Inactive (02/10/2024)   Exercise Vital Sign    Days of Exercise per Week: 0 days    Minutes of Exercise per Session: 0 min  Stress: No Stress Concern Present (02/10/2024)   [employer] of Occupational Health - Occupational Stress Questionnaire    Feeling of Stress: Not at all  Social Connections: Socially Integrated (02/10/2024)   Social Connection and Isolation Panel    Frequency of Communication with Friends and Family: More than three times a week    Frequency of Social Gatherings with Friends and Family: More than three times a week    Attends Religious Services: More than 4 times per year    Active Member of [employer] or Organizations: Yes    Attends Engineer, Structural: More than 4 times per year    Marital Status: Married    Tobacco Counseling Ready to quit: Not Answered Counseling given: Not Answered    Clinical Intake:  Pre-visit preparation completed: Yes  Pain : No/denies pain     BMI - recorded: 21.08 Nutritional Status:  BMI of 19-24  Normal Nutritional Risks: None Diabetes: Yes CBG done?: No  Lab Results  Component Value Date   HGBA1C 6.4 01/01/2024   HGBA1C 6.1 08/21/2023   HGBA1C 6.2 04/02/2023     How often do you need to have someone help you when you read instructions, pamphlets, or other written materials from your doctor or pharmacy?: 1 - Never  Interpreter Needed?: No  Information entered by :: R. Damion Kant LPN   Activities of Daily Living     02/10/2024    1:17 PM 12/19/2023    8:26 AM  In your present state of health, do you have any difficulty performing the following activities:  Hearing? 0 0  Vision? 0 0  Difficulty concentrating or making decisions? 0 0  Walking or climbing stairs? 0   Dressing or bathing? 0   Doing errands, shopping? 0   Preparing Food and eating ? N   Using the Toilet? N   In the past six months, have you accidently leaked urine? N   Do you have problems with loss of bowel control? N   Managing your Medications? N   Managing your Finances? N   Housekeeping or managing your Housekeeping? N     Patient Care Team: Glendia Shad, MD as PCP - General (Internal Medicine) Glendia Shad, MD (Internal Medicine) Dessa Reyes ORN, MD (General Surgery) Melanee Annah BROCKS, MD as Consulting Physician (Oncology) Pa, [facility] (Optometry)  I have updated your Care Teams any recent Medical Services you may have received from other providers in the past year.     Assessment:   This is a routine wellness examination for [place].  Hearing/Vision screen Hearing Screening - Comments:: No issues Vision Screening - Comments:: No corrections   Goals Addressed             This Visit's Progress    Patient Stated       Wants to stay active       Depression Screen     02/10/2024    1:22 PM 01/01/2024    9:18 AM 08/21/2023    9:00 AM 12/26/2022   11:04 AM 10/31/2022   10:56 AM 05/25/2022    2:13 PM 12/20/2021   11:06 AM  PHQ 2/9 Scores  PHQ - 2 Score 0  0  0 1 0 0 0  PHQ- 9 Score 0   1       Fall Risk     02/10/2024    1:20 PM 01/01/2024    9:18 AM 08/21/2023    9:00 AM 12/26/2022   11:04 AM 05/25/2022    2:13 PM  Fall Risk   Falls in the past year? 0 0 0 0 0  Number falls in past yr: 0 0 0 0 0  Injury with Fall? 0 0 0 0   Risk for fall due to : No Fall Risks No Fall Risks No Fall Risks No Fall Risks   Follow up Falls evaluation completed;Falls prevention discussed Falls evaluation completed Falls evaluation completed  Falls evaluation completed;Falls prevention discussed    MEDICARE RISK AT HOME:  Medicare Risk at Home Any stairs in or around the home?: No If so, are there any without handrails?: No Home free of loose throw rugs in walkways, pet beds, electrical cords, etc?: Yes Adequate lighting in your home to reduce risk of falls?: Yes Life alert?: No Use of a cane, walker or w/c?: No Grab bars in the bathroom?: Yes Shower chair or bench in shower?: Yes Elevated toilet seat or a handicapped toilet?: Yes  TIMED UP AND GO:  Was the test performed?  No  Cognitive Function: 6CIT completed        02/10/2024    1:26 PM 05/25/2022    2:33 PM 03/18/2019    9:48 AM  6CIT Screen  What Year? 0 points 0 points 0 points  What month? 0 points 0 points 0 points  What time? 0 points 0 points 0 points  Count back from 20 0 points 0 points 0 points  Months in reverse 0 points 0 points 0 points  Repeat phrase 0 points 0 points 0 points  Total Score 0 points 0 points 0 points    Immunizations  There is no immunization history on file for this patient.  Screening Tests Health Maintenance  Topic Date Due   OPHTHALMOLOGY EXAM  Never done   DTaP/Tdap/Td (1 - Tdap) Never done   Colonoscopy  10/27/2023   Pneumococcal Vaccine: 50+ Years (1 of 2 - PCV) 04/01/2024 (Originally 06/28/1964)   Zoster Vaccines- Shingrix (1 of 2) 04/01/2024 (Originally 06/27/1997)   DEXA SCAN  05/19/2024 (Originally 06/27/2012)   Influenza Vaccine  07/14/2024  (Originally 11/15/2023)   Mammogram  08/20/2024 (Originally 02/02/2021)   FOOT EXAM  04/01/2024   HEMOGLOBIN A1C  06/30/2024   Diabetic kidney evaluation - eGFR measurement  12/31/2024   Diabetic kidney evaluation - Urine ACR  12/31/2024   Medicare Annual Wellness (AWV)  02/09/2025   Hepatitis C Screening  Completed   Meningococcal B Vaccine  Aged Out   COVID-19 Vaccine  Discontinued    Health Maintenance Items Addressed: Patient declines all vaccine and a colonoscopy at this time.  Additional Screening:  Vision Screening: Recommended annual ophthalmology exams for early detection of glaucoma and other disorders of the eye. Is the patient up to date with their annual eye exam?  Yes  Who is the provider or what is the name of the office in which the patient attends annual eye exams?  [facility]   will send for last office notes  Dental Screening: Recommended annual dental exams for proper oral hygiene  Community Resource Referral / Chronic Care Management: CRR required this visit?  No   CCM required this visit?  No   Plan:  I have personally reviewed and noted the following in the patient's chart:   Medical and social history Use of alcohol, tobacco or illicit drugs  Current medications and supplements including opioid prescriptions. Patient is not currently taking opioid prescriptions. Functional ability and status Nutritional status Physical activity Advanced directives List of other physicians Hospitalizations, surgeries, and ER visits in previous 12 months Vitals Screenings to include cognitive, depression, and falls Referrals and appointments  In addition, I have reviewed and discussed with patient certain preventive protocols, quality metrics, and best practice recommendations. A written personalized care plan for preventive services as well as general preventive health recommendations were provided to patient.   Angeline Fredericks, LPN   [id number]   After Visit  Summary: (MyChart) Due to this being a telephonic visit, the after visit summary with patients personalized plan was offered to patient via MyChart   Notes: Nothing significant to report at this time.

## 2024-02-10 NOTE — Patient Instructions (Signed)
 Ms. Kerce,  Thank you for taking the time for your Medicare Wellness Visit. I appreciate your continued commitment to your health goals. Please review the care plan we discussed, and feel free to reach out if I can assist you further.  Medicare recommends these wellness visits once per year to help you and your care team stay ahead of potential health issues. These visits are designed to focus on prevention, allowing your provider to concentrate on managing your acute and chronic conditions during your regular appointments.  Please note that Annual Wellness Visits do not include a physical exam. Some assessments may be limited, especially if the visit was conducted virtually. If needed, we may recommend a separate in-person follow-up with your provider.  Ongoing Care Seeing your primary care provider every 3 to 6 months helps us  monitor your health and provide consistent, personalized care.  Consider updating your vaccines and colonoscopy.   Referrals If a referral was made during today's visit and you haven't received any updates within two weeks, please contact the referred provider directly to check on the status.  Recommended Screenings:  Health Maintenance  Topic Date Due   Eye exam for diabetics  Never done   DTaP/Tdap/Td vaccine (1 - Tdap) Never done   Colon Cancer Screening  10/27/2023   Pneumococcal Vaccine for age over 90 (1 of 2 - PCV) 04/01/2024*   Zoster (Shingles) Vaccine (1 of 2) 04/01/2024*   DEXA scan (bone density measurement)  05/19/2024*   Flu Shot  07/14/2024*   Breast Cancer Screening  08/20/2024*   Complete foot exam   04/01/2024   Hemoglobin A1C  06/30/2024   Yearly kidney function blood test for diabetes  12/31/2024   Yearly kidney health urinalysis for diabetes  12/31/2024   Medicare Annual Wellness Visit  02/09/2025   Hepatitis C Screening  Completed   Meningitis B Vaccine  Aged Out   COVID-19 Vaccine  Discontinued  *Topic was postponed. The date shown is  not the original due date.       02/10/2024    1:26 PM  Advanced Directives  Does Patient Have a Medical Advance Directive? No  Would patient like information on creating a medical advance directive? No - Patient declined   Advance Care Planning is important because it: Ensures you receive medical care that aligns with your values, goals, and preferences. Provides guidance to your family and loved ones, reducing the emotional burden of decision-making during critical moments.  Vision: Annual vision screenings are recommended for early detection of glaucoma, cataracts, and diabetic retinopathy. These exams can also reveal signs of chronic conditions such as diabetes and high blood pressure.  Dental: Annual dental screenings help detect early signs of oral cancer, gum disease, and other conditions linked to overall health, including heart disease and diabetes.  Please see the attached documents for additional preventive care recommendations.

## 2024-02-24 ENCOUNTER — Encounter: Payer: Self-pay | Admitting: Pharmacist

## 2024-02-24 NOTE — Progress Notes (Signed)
 Pharmacy Quality Measure Review  This patient is appearing on a report for being at risk of failing the Kidney Health Evaluation for Patients with Diabetes measure this calendar year.   Insurance report not  up to date. UACR has been collected as of:  Last documented UACR:  Lab Results  Component Value Date   MICRALBCREAT 13.9 01/01/2024

## 2024-03-04 ENCOUNTER — Inpatient Hospital Stay: Attending: Oncology

## 2024-03-04 ENCOUNTER — Inpatient Hospital Stay (HOSPITAL_BASED_OUTPATIENT_CLINIC_OR_DEPARTMENT_OTHER): Admitting: Nurse Practitioner

## 2024-03-04 VITALS — BP 127/67 | HR 66 | Resp 18 | Ht 63.0 in | Wt 118.0 lb

## 2024-03-04 DIAGNOSIS — D649 Anemia, unspecified: Secondary | ICD-10-CM | POA: Diagnosis not present

## 2024-03-04 DIAGNOSIS — D638 Anemia in other chronic diseases classified elsewhere: Secondary | ICD-10-CM | POA: Diagnosis not present

## 2024-03-04 DIAGNOSIS — D508 Other iron deficiency anemias: Secondary | ICD-10-CM

## 2024-03-04 DIAGNOSIS — D509 Iron deficiency anemia, unspecified: Secondary | ICD-10-CM | POA: Diagnosis not present

## 2024-03-04 LAB — IRON AND TIBC
Iron: 54 ug/dL (ref 28–170)
Saturation Ratios: 23 % (ref 10.4–31.8)
TIBC: 239 ug/dL — ABNORMAL LOW (ref 250–450)
UIBC: 185 ug/dL

## 2024-03-04 LAB — CBC
HCT: 36.4 % (ref 36.0–46.0)
Hemoglobin: 12 g/dL (ref 12.0–15.0)
MCH: 31 pg (ref 26.0–34.0)
MCHC: 33 g/dL (ref 30.0–36.0)
MCV: 94.1 fL (ref 80.0–100.0)
Platelets: 252 K/uL (ref 150–400)
RBC: 3.87 MIL/uL (ref 3.87–5.11)
RDW: 14.8 % (ref 11.5–15.5)
WBC: 6.4 K/uL (ref 4.0–10.5)
nRBC: 0 % (ref 0.0–0.2)

## 2024-03-04 LAB — FERRITIN: Ferritin: 379 ng/mL — ABNORMAL HIGH (ref 11–307)

## 2024-03-04 NOTE — Progress Notes (Signed)
 Hematology/Oncology Consult note [facility]  Telephone:([phone] [phone]  Patient Care Team: Glendia Shad, MD as PCP - General (Internal Medicine) Glendia Shad, MD (Internal Medicine) Dessa, Reyes ORN, MD (General Surgery) Melanee Annah BROCKS, MD as Consulting Physician (Oncology) Pa, [facility])   Name of the patient: Jessica Wall  [id number]  [birth year]   Date of visit: 03/04/24  Diagnosis- normocytic anemia likely combination of iron  deficiency and anemia of chronic disease    Chief complaint/ Reason for visit-routine follow-up of anemia  Heme/Onc history: Patient is a 78 year old female past medical history is significant for hypothyroidism hypertension and hyperlipidemia. 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 She is scheduled to undergo left hip replacement and is here for further evaluation of anemia. Most recent CBC from 10/17/2022 showed an H&H of 10.1/30.7 which is gradually drifted down over the last 2 years. Prior to that her hemoglobin was around 12. Most recent iron  studies showed a low iron  saturation of 6.4% and low TIBC and normal ferritin of 237.    Results of anemia workup from 10/31/2022 were as follows: H&H 8.8/27.9 ferritin level 201 iron  saturation 12% folate and B12 are normal.  Myeloma panel showed no M protein.  Haptoglobin elevated at 554.  Reticulocyte count low at 1.6.  Serum free light chain ratio normal.  Interval history-patient feels well overall and denies any complaints at this time.  Appetite has been good and she denies any bleeding in her stool or urine.  Patient was questioning if she needed to continue to come to clinic.  Today her hemoglobin is 12 and remains stable however we have been monitoring to make sure no further intervention was needed to determine the source of anemia.  She has an  appointment with her primary care in January.  We discussed the plan for her to follow-up with us  1 more time in 6 months for repeat blood work.  She can also discuss with PCP in January if she feels that continued follow-up is necessary we would be happy to see her.  As she does have some chronic anemia that could require intervention in the future.  She verbalizes understanding and in agreement with the plan.  ECOG PS- 0 Pain scale- 0   Review of systems- Review of Systems  Constitutional:  Negative for chills, fever, malaise/fatigue and weight loss.  HENT:  Negative for congestion, ear discharge and nosebleeds.   Eyes:  Negative for blurred vision.  Respiratory:  Negative for cough, hemoptysis, sputum production, shortness of breath and wheezing.   Cardiovascular:  Negative for chest pain, palpitations, orthopnea and claudication.  Gastrointestinal:  Negative for abdominal pain, blood in stool, constipation, diarrhea, heartburn, melena, nausea and vomiting.  Genitourinary:  Negative for dysuria, flank pain, frequency, hematuria and urgency.  Musculoskeletal:  Negative for back pain, joint pain and myalgias.  Skin:  Negative for rash.  Neurological:  Negative for dizziness, tingling, focal weakness, seizures, weakness and headaches.  Endo/Heme/Allergies:  Does not bruise/bleed easily.  Psychiatric/Behavioral:  Negative for depression and suicidal ideas. The patient does not have insomnia.       Allergies  Allergen Reactions   Ace Inhibitors Swelling   Aspirin Swelling     Past Medical History:  Diagnosis Date   Anemia    Angioedema due to angiotensin converting enzyme inhibitor (ACE-I)    Avascular necrosis (HCC)  Decreased GFR    DM (diabetes mellitus), type 2 (HCC)    no meds   Esophagitis    Goiter    s/p thyroidectomy   History of colonic polyps    Hypercholesterolemia    Hypertension    no current medications   Hypothyroidism    Marijuana use    Recurrent sinus  infections    and allergy problems   Syncopal episodes    last one 12-16-22   Temporal arteritis (HCC)    Tobacco use    Type 2 diabetes mellitus (HCC)    Uterine fibroid      Past Surgical History:  Procedure Laterality Date   ARTERY BIOPSY Right 03/13/2021   Procedure: BIOPSY TEMPORAL ARTERY;  Surgeon: Dessa Reyes ORN, MD;  Location: [facility] ORS;  Service: General;  Laterality: Right;   BREAST BIOPSY     CATARACT EXTRACTION W/PHACO Left 12/05/2023   Procedure: PHACOEMULSIFICATION, CATARACT, WITH IOL INSERTION 22.49 01:56.2;  Surgeon: Enola Feliciano Hugger, MD;  Location: [facility];  Service: Ophthalmology;  Laterality: Left;   CATARACT EXTRACTION W/PHACO Right 12/19/2023   Procedure: PHACOEMULSIFICATION, CATARACT, WITH IOL INSERTION 25.65 02:13.9;  Surgeon: Enola Feliciano Hugger, MD;  Location: [facility];  Service: Ophthalmology;  Laterality: Right;   COLONOSCOPY WITH PROPOFOL  N/A 11/03/2014   Procedure: COLONOSCOPY WITH PROPOFOL ;  Surgeon: Lamar ONEIDA Holmes, MD;  Location: [facility] ENDOSCOPY;  Service: Endoscopy;  Laterality: N/A;   COLONOSCOPY WITH PROPOFOL  N/A 02/12/2018   Procedure: COLONOSCOPY WITH PROPOFOL ;  Surgeon: Holmes Lamar ONEIDA, MD;  Location: [facility] ENDOSCOPY;  Service: Endoscopy;  Laterality: N/A;   DILATION AND CURETTAGE OF UTERUS  1980 & 1993   ESOPHAGOGASTRODUODENOSCOPY N/A 11/03/2014   Procedure: ESOPHAGOGASTRODUODENOSCOPY (EGD);  Surgeon: Lamar ONEIDA Holmes, MD;  Location: [facility] ENDOSCOPY;  Service: Endoscopy;  Laterality: N/A;   LAPAROSCOPIC APPENDECTOMY N/A 11/26/2014   Procedure: APPENDECTOMY LAPAROSCOPIC;  Surgeon: Reyes ORN Dessa, MD;  Location: [facility] ORS;  Service: General;  Laterality: N/A;   thyroidectomy  1979   goiter   TOTAL HIP ARTHROPLASTY Left 01/03/2023   Procedure: TOTAL HIP ARTHROPLASTY ANTERIOR APPROACH -RNFA;  Surgeon: Lorelle Hussar, MD;  Location: [facility] ORS;  Service: Orthopedics;  Laterality: Left;   TUBAL LIGATION  1975    Social  History   Socioeconomic History   Marital status: Married    Spouse name: Not on file   Number of children: Not on file   Years of education: Not on file   Highest education level: Not on file  Occupational History   Not on file  Tobacco Use   Smoking status: Some Days    Types: E-cigarettes   Smokeless tobacco: Never  Vaping Use   Vaping status: Never Used  Substance and Sexual Activity   Alcohol use: Yes    Alcohol/week: 3.0 - 4.0 standard drinks of alcohol    Types: 3 - 4 Standard drinks or equivalent per week    Comment: rare   Drug use: Yes    Types: Marijuana    Comment: marijuana every day   Sexual activity: Not on file  Other Topics Concern   Not on file  Social History Narrative   married   Social Drivers of Corporate Investment Banker Strain: Low Risk  (02/10/2024)   Overall Financial Resource Strain (CARDIA)    Difficulty of Paying Living Expenses: Not hard at all  Food Insecurity: No Food Insecurity (02/10/2024)   Hunger Vital Sign    Worried About Running Out of Food  in the Last Year: Never true    Ran Out of Food in the Last Year: Never true  Transportation Needs: No Transportation Needs (02/10/2024)   PRAPARE - Administrator, Civil Service (Medical): No    Lack of Transportation (Non-Medical): No  Physical Activity: Inactive (02/10/2024)   Exercise Vital Sign    Days of Exercise per Week: 0 days    Minutes of Exercise per Session: 0 min  Stress: No Stress Concern Present (02/10/2024)   [employer] of Occupational Health - Occupational Stress Questionnaire    Feeling of Stress: Not at all  Social Connections: Socially Integrated (02/10/2024)   Social Connection and Isolation Panel    Frequency of Communication with Friends and Family: More than three times a week    Frequency of Social Gatherings with Friends and Family: More than three times a week    Attends Religious Services: More than 4 times per year    Active Member of  [employer] or Organizations: Yes    Attends Engineer, Structural: More than 4 times per year    Marital Status: Married  Catering Manager Violence: Not At Risk (02/10/2024)   Humiliation, Afraid, Rape, and Kick questionnaire    Fear of Current or Ex-Partner: No    Emotionally Abused: No    Physically Abused: No    Sexually Abused: No    Family History  Problem Relation Age of Onset   Tuberculosis Paternal Grandfather    Alzheimer's disease Mother    Goiter Mother    Cancer Brother    Cancer Brother    Cancer Brother    Cancer Brother      Current Outpatient Medications:    calcium  carbonate (OSCAL) 1500 (600 Ca) MG TABS tablet, Take by mouth 2 (two) times daily with a meal., Disp: , Rfl:    ferrous sulfate  325 (65 FE) MG tablet, Take 50 mg by mouth daily with breakfast., Disp: , Rfl:    levothyroxine  (SYNTHROID ) 50 MCG tablet, Take 1 tablet (50 mcg total) by mouth daily., Disp: 90 tablet, Rfl: 1   rosuvastatin  (CRESTOR ) 5 MG tablet, Take 1 tablet (5 mg total) by mouth daily. (Patient not taking: Reported on 03/04/2024), Disp: 90 tablet, Rfl: 3  Physical exam:  Vitals:   03/04/24 0913 03/04/24 0917  BP:  127/67  Pulse:  66  Resp:  18  Weight: 118 lb (53.5 kg)   Height: 5' 3 (1.6 m)     Physical Exam Cardiovascular:     Rate and Rhythm: Normal rate and regular rhythm.     Heart sounds: Normal heart sounds.  Pulmonary:     Effort: Pulmonary effort is normal.     Breath sounds: Normal breath sounds.  Skin:    General: Skin is warm and dry.  Neurological:     Mental Status: She is alert and oriented to person, place, and time.         Latest Ref Rng & Units 01/01/2024    9:32 AM  CMP  Glucose 70 - 99 mg/dL 99   BUN 6 - 23 mg/dL 13   Creatinine 9.59 - 1.20 mg/dL 9.10   Sodium 864 - 854 mEq/L 140   Potassium 3.5 - 5.1 mEq/L 4.3   Chloride 96 - 112 mEq/L 106   CO2 19 - 32 mEq/L 28   Calcium  8.4 - 10.5 mg/dL 9.3   Total Protein 6.0 - 8.3 g/dL 7.3   Total  Bilirubin 0.2 - 1.2  mg/dL 0.3   Alkaline Phos 39 - 117 U/L 91   AST 0 - 37 U/L 20   ALT 0 - 35 U/L 12       Latest Ref Rng & Units 03/04/2024    8:56 AM  CBC  WBC 4.0 - 10.5 K/uL 6.4   Hemoglobin 12.0 - 15.0 g/dL 87.9   Hematocrit 63.9 - 46.0 % 36.4   Platelets 150 - 400 K/uL 252     No images are attached to the encounter.  No results found.   Assessment and plan- Patient is a 78 y.o. female here for routine follow-up of normocytic anemia with a component of iron  deficiency anemia  After receiving IV iron  in the past patient's hemoglobin improved from 8.8 and is presently at 10.9.  Baseline hemoglobin runs around 11.5.  Ferritin levels have been mildly elevated at 340 without a clear rising trend.  Iron  studies are indicative of anemia of chronic disease.  She does not have any evidence of chronic kidney disease.  Plan is to monitor her anemia conservatively and as long as it remains between 10-11 there would be no need for any further investigations.  However if there is a continued decrease in her hemoglobin we could consider getting bone marrow biopsy.  Today she is stable recommend hemoglobin is 12.  Ferritin and iron  studies still pending.  Plan was made to follow-up with repeat blood work in 6 months.   Follow-up plan: Follow-up in 6 months see NP/MD with the following labs CBC with differential, CMP, ferritin, iron  studies     Morna Husband, AGNP-C [facility] at [facility] [phone] 03/04/2024 10:22 AM

## 2024-05-06 ENCOUNTER — Ambulatory Visit: Admitting: Internal Medicine

## 2024-05-06 ENCOUNTER — Encounter: Payer: Self-pay | Admitting: Internal Medicine

## 2024-05-06 VITALS — BP 126/78 | HR 62 | Temp 98.5°F | Ht 63.0 in | Wt 120.0 lb

## 2024-05-06 DIAGNOSIS — D649 Anemia, unspecified: Secondary | ICD-10-CM | POA: Diagnosis not present

## 2024-05-06 DIAGNOSIS — I10 Essential (primary) hypertension: Secondary | ICD-10-CM | POA: Diagnosis not present

## 2024-05-06 DIAGNOSIS — E7800 Pure hypercholesterolemia, unspecified: Secondary | ICD-10-CM | POA: Diagnosis not present

## 2024-05-06 DIAGNOSIS — R9089 Other abnormal findings on diagnostic imaging of central nervous system: Secondary | ICD-10-CM

## 2024-05-06 DIAGNOSIS — E039 Hypothyroidism, unspecified: Secondary | ICD-10-CM | POA: Diagnosis not present

## 2024-05-06 DIAGNOSIS — Z860100 Personal history of colon polyps, unspecified: Secondary | ICD-10-CM | POA: Diagnosis not present

## 2024-05-06 DIAGNOSIS — E1165 Type 2 diabetes mellitus with hyperglycemia: Secondary | ICD-10-CM

## 2024-05-06 LAB — BASIC METABOLIC PANEL WITH GFR
BUN: 13 mg/dL (ref 6–23)
CO2: 30 meq/L (ref 19–32)
Calcium: 9.1 mg/dL (ref 8.4–10.5)
Chloride: 104 meq/L (ref 96–112)
Creatinine, Ser: 0.95 mg/dL (ref 0.40–1.20)
GFR: 58.06 mL/min — ABNORMAL LOW
Glucose, Bld: 77 mg/dL (ref 70–99)
Potassium: 4.6 meq/L (ref 3.5–5.1)
Sodium: 139 meq/L (ref 135–145)

## 2024-05-06 LAB — HEPATIC FUNCTION PANEL
ALT: 19 U/L (ref 3–35)
AST: 28 U/L (ref 5–37)
Albumin: 3.9 g/dL (ref 3.5–5.2)
Alkaline Phosphatase: 98 U/L (ref 39–117)
Bilirubin, Direct: 0.1 mg/dL (ref 0.1–0.3)
Total Bilirubin: 0.3 mg/dL (ref 0.2–1.2)
Total Protein: 7.3 g/dL (ref 6.0–8.3)

## 2024-05-06 LAB — LIPID PANEL
Cholesterol: 127 mg/dL (ref 28–200)
HDL: 67.1 mg/dL
LDL Cholesterol: 44 mg/dL (ref 10–99)
NonHDL: 59.73
Total CHOL/HDL Ratio: 2
Triglycerides: 81 mg/dL (ref 10.0–149.0)
VLDL: 16.2 mg/dL (ref 0.0–40.0)

## 2024-05-06 LAB — HM DIABETES FOOT EXAM

## 2024-05-06 LAB — TSH: TSH: 2.7 u[IU]/mL (ref 0.35–5.50)

## 2024-05-06 LAB — HEMOGLOBIN A1C: Hgb A1c MFr Bld: 6.2 % (ref 4.6–6.5)

## 2024-05-06 MED ORDER — LEVOTHYROXINE SODIUM 50 MCG PO TABS: 50.0000 ug | ORAL_TABLET | Freq: Every day | ORAL | 1 refills | Status: AC

## 2024-05-06 NOTE — Progress Notes (Signed)
 "  Subjective:    [name], female    DOB: Jan 12, 1946, 78 y.o.   MRN: [id number]  Patient here for  Chief Complaint  Patient presents with   Medical Management of Chronic Issues    HPI Here for a scheduled follow up - follow up regarding anemia, hypothyroidism and hypertension. Had f/u with hematology 07/03/23 - f/u anemia. Recommended to monitor her anemia conservatively and if remains between 10-11 - no need for any further investigations. Reevaluated 03/04/24 - continue to monitor with parameters as outlined. Had f/u with Dr Lorelle 10/23/23 - stable. F/u one year. . Is s/p left anterior total replacement (hip). S/p PT. Doing well. Feels good. No chest pain or sob reported. No abdominal pain or bowel change reported.    Past Medical History:  Diagnosis Date   Anemia    Angioedema due to angiotensin converting enzyme inhibitor (ACE-I)    Avascular necrosis (HCC)    Decreased GFR    DM (diabetes mellitus), type 2 (HCC)    no meds   Esophagitis    Goiter    s/p thyroidectomy   History of colonic polyps    Hypercholesterolemia    Hypertension    no current medications   Hypothyroidism    Marijuana use    Recurrent sinus infections    and allergy problems   Syncopal episodes    last one 12-16-22   Temporal arteritis (HCC)    Tobacco use    Type 2 diabetes mellitus (HCC)    Uterine fibroid    Past Surgical History:  Procedure Laterality Date   ARTERY BIOPSY Right 03/13/2021   Procedure: BIOPSY TEMPORAL ARTERY;  Surgeon: Dessa Reyes ORN, MD;  Location: [facility] ORS;  Service: General;  Laterality: Right;   BREAST BIOPSY     CATARACT EXTRACTION W/PHACO Left 12/05/2023   Procedure: PHACOEMULSIFICATION, CATARACT, WITH IOL INSERTION 22.49 01:56.2;  Surgeon: Enola Feliciano Hugger, MD;  Location: [facility];  Service: Ophthalmology;  Laterality: Left;   CATARACT EXTRACTION W/PHACO Right 12/19/2023   Procedure: PHACOEMULSIFICATION, CATARACT, WITH IOL INSERTION  25.65 02:13.9;  Surgeon: Enola Feliciano Hugger, MD;  Location: [facility];  Service: Ophthalmology;  Laterality: Right;   COLONOSCOPY WITH PROPOFOL  N/A 11/03/2014   Procedure: COLONOSCOPY WITH PROPOFOL ;  Surgeon: Lamar ONEIDA Holmes, MD;  Location: [facility] END[facility]COPY;  Service: Endoscopy;  Laterality: N/A;   COLONOSCOPY WITH PROPOFOL  N/A 02/12/2018   Procedure: COLONOSCOPY WITH PROPOFOL ;  Surgeon: Holmes Lamar ONEIDA, MD;  Location: [facility];  Service: Endoscopy;  Laterality: N/A;   DILATION AND CURETTAGE OF UTERUS  1980 & 1993   ESOPHAGOGASTRODUODENOSCOPY N/A 11/03/2014   Procedure: ESOPHAGOGASTRODUODENOSCOPY (EGD);  Surgeon: Lamar ONEIDA Holmes, MD;  Location: [facility] ENDOSCOPY;  Service: Endoscopy;  Laterality: N/A;   LAPAROSCOPIC APPENDECTOMY N/A 11/26/2014   Procedure: APPENDECTOMY LAPAROSCOPIC;  Surgeon: Reyes ORN Dessa, MD;  Location: [facility] ORS;  Service: General;  Laterality: N/A;   thyroidectomy  1979   goiter   TOTAL HIP ARTHROPLASTY Left 01/03/2023   Procedure: TOTAL HIP ARTHROPLASTY ANTERIOR APPROACH -RNFA;  Surgeon: Lorelle Hussar, MD;  Location: [facility] ORS;  Service: Orthopedics;  Laterality: Left;   TUBAL LIGATION  1975   Family History  Problem Relation Age of Onset   Tuberculosis Paternal Grandfather    Alzheimer's disease Mother    Goiter Mother    Cancer Brother    Cancer Brother    Cancer Brother    Cancer Brother    Social History   Socioeconomic  History   Marital status: Married    Spouse name: Not on file   Number of children: Not on file   Years of education: Not on file   Highest education level: Not on file  Occupational History   Not on file  Tobacco Use   Smoking status: Some Days    Types: E-cigarettes   Smokeless tobacco: Never  Vaping Use   Vaping status: Never Used  Substance and Sexual Activity   Alcohol use: Yes    Alcohol/week: 3.0 - 4.0 standard drinks of alcohol    Types: 3 - 4 Standard drinks or equivalent per week    Comment: rare    Drug use: Yes    Types: Marijuana    Comment: marijuana every day   Sexual activity: Not on file  Other Topics Concern   Not on file  Social History Narrative   married   Social Drivers of Health   Tobacco Use: High Risk (05/06/2024)   Patient History    Smoking Tobacco Use: Some Days    Smokeless Tobacco Use: Never    Passive Exposure: Not on file  Financial Resource Strain: Low Risk (02/10/2024)   Overall Financial Resource Strain (CARDIA)    Difficulty of Paying Living Expenses: Not hard at all  Food Insecurity: No Food Insecurity (02/10/2024)   Epic    Worried About Programme Researcher, Broadcasting/film/video in the Last Year: Never true    Ran Out of Food in the Last Year: Never true  Transportation Needs: No Transportation Needs (02/10/2024)   Epic    Lack of Transportation (Medical): No    Lack of Transportation (Non-Medical): No  Physical Activity: Inactive (02/10/2024)   Exercise Vital Sign    Days of Exercise per Week: 0 days    Minutes of Exercise per Session: 0 min  Stress: No Stress Concern Present (02/10/2024)   [employer] of Occupational Health - Occupational Stress Questionnaire    Feeling of Stress: Not at all  Social Connections: Socially Integrated (02/10/2024)   Social Connection and Isolation Panel    Frequency of Communication with Friends and Family: More than three times a week    Frequency of Social Gatherings with Friends and Family: More than three times a week    Attends Religious Services: More than 4 times per year    Active Member of Clubs or Organizations: Yes    Attends Banker Meetings: More than 4 times per year    Marital Status: Married  Depression (PHQ2-9): Low Risk (03/04/2024)   Depression (PHQ2-9)    PHQ-2 Score: 0  Alcohol Screen: Low Risk (02/10/2024)   Alcohol Screen    Last Alcohol Screening Score (AUDIT): 1  Housing: Unknown (02/10/2024)   Epic    Unable to Pay for Housing in the Last Year: No    Number of Times Moved in the  Last Year: Not on file    Homeless in the Last Year: No  Utilities: Not At Risk (02/10/2024)   Epic    Threatened with loss of utilities: No  Health Literacy: Adequate Health Literacy (02/10/2024)   B1300 Health Literacy    Frequency of need for help with medical instructions: Never     Review of Systems  Constitutional:  Negative for appetite change and unexpected weight change.  HENT:  Negative for congestion and sinus pressure.   Respiratory:  Negative for cough, chest tightness and shortness of breath.   Cardiovascular:  Negative for chest pain, palpitations and  leg swelling.  Gastrointestinal:  Negative for abdominal pain, diarrhea, nausea and vomiting.  Genitourinary:  Negative for difficulty urinating and dysuria.  Musculoskeletal:  Negative for joint swelling and myalgias.  Skin:  Negative for color change and rash.  Neurological:  Negative for dizziness and headaches.  Psychiatric/Behavioral:  Negative for agitation and dysphoric mood.        Objective:     BP 126/78   Pulse 62   Temp 98.5 F (36.9 C) (Oral)   Ht 5' 3 (1.6 m)   Wt 120 lb (54.4 kg)   SpO2 100%   BMI 21.26 kg/m  Wt Readings from Last 3 Encounters:  05/06/24 120 lb (54.4 kg)  03/04/24 118 lb (53.5 kg)  02/10/24 119 lb (54 kg)    Physical Exam Vitals reviewed.  Constitutional:      General: She is not in acute distress.    Appearance: Normal appearance.  HENT:     Head: Normocephalic and atraumatic.     Right Ear: External ear normal.     Left Ear: External ear normal.     Mouth/Throat:     Pharynx: No oropharyngeal exudate or posterior oropharyngeal erythema.  Eyes:     General: No scleral icterus.       Right eye: No discharge.        Left eye: No discharge.     Conjunctiva/sclera: Conjunctivae normal.  Neck:     Thyroid : No thyromegaly.  Cardiovascular:     Rate and Rhythm: Normal rate and regular rhythm.  Pulmonary:     Effort: No respiratory distress.     Breath sounds:  Normal breath sounds. No wheezing.  Abdominal:     General: Bowel sounds are normal.     Palpations: Abdomen is soft.     Tenderness: There is no abdominal tenderness.  Musculoskeletal:        General: No swelling or tenderness.     Cervical back: Neck supple. No tenderness.  Lymphadenopathy:     Cervical: No cervical adenopathy.  Skin:    Findings: No erythema or rash.  Neurological:     Mental Status: She is alert.  Psychiatric:        Mood and Affect: Mood normal.        Behavior: Behavior normal.         Outpatient Encounter Medications as of 05/06/2024  Medication Sig   ferrous sulfate  325 (65 FE) MG tablet Take 50 mg by mouth daily with breakfast.   rosuvastatin  (CRESTOR ) 5 MG tablet Take 1 tablet (5 mg total) by mouth daily.   levothyroxine  (SYNTHROID ) 50 MCG tablet Take 1 tablet (50 mcg total) by mouth daily.   [DISCONTINUED] calcium  carbonate (OSCAL) 1500 (600 Ca) MG TABS tablet Take by mouth 2 (two) times daily with a meal.   [DISCONTINUED] levothyroxine  (SYNTHROID ) 50 MCG tablet Take 1 tablet (50 mcg total) by mouth daily.   No facility-administered encounter medications on file as of 05/06/2024.     Lab Results  Component Value Date   WBC 6.4 03/04/2024   HGB 12.0 03/04/2024   HCT 36.4 03/04/2024   PLT 252 03/04/2024   GLUCOSE 77 05/06/2024   CHOL 127 05/06/2024   TRIG 81.0 05/06/2024   HDL 67.10 05/06/2024   LDLCALC 44 05/06/2024   ALT 19 05/06/2024   AST 28 05/06/2024   NA 139 05/06/2024   K 4.6 05/06/2024   CL 104 05/06/2024   CREATININE 0.95 05/06/2024   BUN 13 05/06/2024  CO2 30 05/06/2024   TSH 2.70 05/06/2024   INR 1.2 06/07/2021   HGBA1C 6.2 05/06/2024   MICROALBUR 1.4 01/01/2024       Assessment & Plan:  Abnormal MRA, brain Assessment & Plan: MRA brain performed during previous w/up for headaches. MRA - Mildly motion degraded exam. No intracranial large vessel occlusion or proximal high-grade arterial stenosis.  Atherosclerotic  irregularity of the M2 and more distal MCA vessels, bilaterally. Both posterior cerebral arteries demonstrate distal branch atherosclerotic irregularity. 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 Apparent 1-2 mm broad-based vascular protrusion arising from the supraclinoid right ICA, which may reflect a small aneurysm or artifact related to motion.  Had referred her to neurology.  It appears they tried to schedule an appt.  No headache currently. She previously declined further w/up.  Follow.    Hypothyroidism, unspecified type Assessment & Plan: On synthroid . Follow tsh.   Orders: -     TSH  Hypercholesterolemia Assessment & Plan: Continue crestor .  Check lipid panel.  Lab Results  Component Value Date   CHOL 127 05/06/2024   HDL 67.10 05/06/2024   LDLCALC 44 05/06/2024   TRIG 81.0 05/06/2024   CHOLHDL 2 05/06/2024    Orders: -     Hepatic function panel -     Lipid panel  Type 2 diabetes mellitus with hyperglycemia, without long-term current use of insulin (HCC) Assessment & Plan: Off prednisone . Follow met b and A1c.   Orders: -     Hemoglobin A1c -     Basic metabolic panel with GFR  Anemia, unspecified type Assessment & Plan: Colonoscopy 09/27/20 - pathology - rectal polyp - hyperplastic polyp.  Recommended no f/u colonoscopy.  Weight stable.  Anemia of chronic disease. (Dr Melanee - 06/2023). No further w/up if hgb remains 10-11.    Essential hypertension, benign Assessment & Plan: Remain off blood pressure medication.  Blood pressure doing well. Follow.    History of colonic polyps Assessment & Plan: Colonoscopy 09/2020 - recommended no f/u colonoscopy.     Other orders -     Levothyroxine  Sodium; Take 1 tablet (50 mcg total) by mouth daily.  Dispense: 90 tablet; Refill: 1     Allena Hamilton, MD "

## 2024-05-07 ENCOUNTER — Ambulatory Visit: Payer: Self-pay | Admitting: Internal Medicine

## 2024-05-10 NOTE — Assessment & Plan Note (Signed)
Colonoscopy 09/2020 - recommended no f/u colonoscopy.   

## 2024-05-10 NOTE — Assessment & Plan Note (Signed)
 Remain off blood pressure medication.  Blood pressure doing well. Follow.

## 2024-05-10 NOTE — Assessment & Plan Note (Signed)
 Continue crestor .  Check lipid panel.  Lab Results  Component Value Date   CHOL 127 05/06/2024   HDL 67.10 05/06/2024   LDLCALC 44 05/06/2024   TRIG 81.0 05/06/2024   CHOLHDL 2 05/06/2024

## 2024-05-10 NOTE — Assessment & Plan Note (Signed)
 Colonoscopy 09/27/20 - pathology - rectal polyp - hyperplastic polyp.  Recommended no f/u colonoscopy.  Weight stable.  Anemia of chronic disease. (Dr Melanee - 06/2023). No further w/up if hgb remains 10-11.

## 2024-05-10 NOTE — Assessment & Plan Note (Signed)
On synthroid.  Follow tsh.   

## 2024-05-10 NOTE — Assessment & Plan Note (Signed)
 Off prednisone . Follow met b and A1c.

## 2024-05-10 NOTE — Assessment & Plan Note (Signed)
 MRA brain performed during previous w/up for headaches. MRA - Mildly motion degraded exam. No intracranial large vessel occlusion or proximal high-grade arterial stenosis.  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 Both posterior cerebral arteries demonstrate distal branch atherosclerotic irregularity. 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 Apparent 1-2 mm broad-based vascular protrusion arising from the supraclinoid right ICA, which may reflect a small aneurysm or artifact related to motion.  Had referred her to neurology.  It appears they tried to schedule an appt.  No headache currently. She previously declined further w/up.  Follow.

## 2024-09-02 ENCOUNTER — Inpatient Hospital Stay: Admitting: Oncology

## 2024-09-02 ENCOUNTER — Inpatient Hospital Stay

## 2024-09-09 ENCOUNTER — Encounter: Admitting: Internal Medicine

## 2025-02-24 ENCOUNTER — Ambulatory Visit
